# Patient Record
Sex: Female | Born: 1935 | Race: White | Hispanic: No | Smoking: Former smoker
Health system: Southern US, Community
[De-identification: ages and names within clinical notes are randomized; demographics above are authoritative.]

## PROBLEM LIST (undated history)

## (undated) DIAGNOSIS — K219 Gastro-esophageal reflux disease without esophagitis: Secondary | ICD-10-CM

## (undated) DIAGNOSIS — I341 Nonrheumatic mitral (valve) prolapse: Secondary | ICD-10-CM

## (undated) DIAGNOSIS — H409 Unspecified glaucoma: Secondary | ICD-10-CM

## (undated) DIAGNOSIS — H3530 Unspecified macular degeneration: Secondary | ICD-10-CM

## (undated) DIAGNOSIS — I471 Supraventricular tachycardia, unspecified: Secondary | ICD-10-CM

## (undated) DIAGNOSIS — H5440 Blindness, one eye, unspecified eye: Secondary | ICD-10-CM

## (undated) DIAGNOSIS — M1990 Unspecified osteoarthritis, unspecified site: Secondary | ICD-10-CM

## (undated) DIAGNOSIS — R002 Palpitations: Secondary | ICD-10-CM

## (undated) DIAGNOSIS — R5383 Other fatigue: Secondary | ICD-10-CM

## (undated) HISTORY — DX: Other fatigue: R53.83

## (undated) HISTORY — DX: Palpitations: R00.2

## (undated) HISTORY — DX: Nonrheumatic mitral (valve) prolapse: I34.1

## (undated) HISTORY — DX: Blindness, one eye, unspecified eye: H54.40

## (undated) HISTORY — PX: PARTIAL HIP ARTHROPLASTY: SHX733

## (undated) HISTORY — PX: CATARACT EXTRACTION: SUR2

## (undated) HISTORY — DX: Unspecified osteoarthritis, unspecified site: M19.90

## (undated) HISTORY — PX: RETINAL DETACHMENT SURGERY: SHX105

## (undated) HISTORY — PX: OTHER SURGICAL HISTORY: SHX169

## (undated) HISTORY — DX: Supraventricular tachycardia, unspecified: I47.10

## (undated) HISTORY — DX: Supraventricular tachycardia: I47.1

---

## 1988-05-21 HISTORY — PX: TOTAL HIP ARTHROPLASTY: SHX124

## 1999-08-14 ENCOUNTER — Ambulatory Visit (HOSPITAL_COMMUNITY): Admission: RE | Admit: 1999-08-14 | Discharge: 1999-08-14 | Payer: Self-pay | Admitting: Pulmonary Disease

## 1999-08-14 ENCOUNTER — Encounter: Payer: Self-pay | Admitting: Pulmonary Disease

## 2000-02-02 ENCOUNTER — Ambulatory Visit (HOSPITAL_COMMUNITY): Admission: RE | Admit: 2000-02-02 | Discharge: 2000-02-02 | Payer: Self-pay | Admitting: Internal Medicine

## 2001-01-21 ENCOUNTER — Emergency Department (HOSPITAL_COMMUNITY): Admission: EM | Admit: 2001-01-21 | Discharge: 2001-01-22 | Payer: Self-pay | Admitting: Emergency Medicine

## 2001-01-21 ENCOUNTER — Encounter: Payer: Self-pay | Admitting: Emergency Medicine

## 2001-01-29 ENCOUNTER — Ambulatory Visit (HOSPITAL_COMMUNITY): Admission: RE | Admit: 2001-01-29 | Discharge: 2001-01-29 | Payer: Self-pay | Admitting: Family Medicine

## 2001-03-06 ENCOUNTER — Other Ambulatory Visit: Admission: RE | Admit: 2001-03-06 | Discharge: 2001-03-06 | Payer: Self-pay | Admitting: Family Medicine

## 2003-06-14 ENCOUNTER — Other Ambulatory Visit: Admission: RE | Admit: 2003-06-14 | Discharge: 2003-06-14 | Payer: Self-pay | Admitting: Family Medicine

## 2003-08-09 ENCOUNTER — Ambulatory Visit (HOSPITAL_COMMUNITY): Admission: RE | Admit: 2003-08-09 | Discharge: 2003-08-09 | Payer: Self-pay | Admitting: Family Medicine

## 2010-04-18 ENCOUNTER — Ambulatory Visit: Payer: Self-pay | Admitting: Cardiology

## 2010-05-16 ENCOUNTER — Ambulatory Visit: Payer: Self-pay | Admitting: Cardiology

## 2010-09-07 ENCOUNTER — Encounter: Payer: Self-pay | Admitting: Cardiology

## 2010-09-07 DIAGNOSIS — H5440 Blindness, one eye, unspecified eye: Secondary | ICD-10-CM | POA: Insufficient documentation

## 2010-09-07 DIAGNOSIS — I471 Supraventricular tachycardia: Secondary | ICD-10-CM | POA: Insufficient documentation

## 2010-09-07 DIAGNOSIS — R5383 Other fatigue: Secondary | ICD-10-CM | POA: Insufficient documentation

## 2010-09-07 DIAGNOSIS — R002 Palpitations: Secondary | ICD-10-CM | POA: Insufficient documentation

## 2010-09-07 DIAGNOSIS — M1990 Unspecified osteoarthritis, unspecified site: Secondary | ICD-10-CM | POA: Insufficient documentation

## 2010-09-07 DIAGNOSIS — I341 Nonrheumatic mitral (valve) prolapse: Secondary | ICD-10-CM | POA: Insufficient documentation

## 2010-09-11 ENCOUNTER — Ambulatory Visit (INDEPENDENT_AMBULATORY_CARE_PROVIDER_SITE_OTHER): Payer: Medicare Other | Admitting: Cardiology

## 2010-09-11 ENCOUNTER — Encounter: Payer: Self-pay | Admitting: Cardiology

## 2010-09-11 DIAGNOSIS — I341 Nonrheumatic mitral (valve) prolapse: Secondary | ICD-10-CM

## 2010-09-11 DIAGNOSIS — I471 Supraventricular tachycardia: Secondary | ICD-10-CM

## 2010-09-11 DIAGNOSIS — I059 Rheumatic mitral valve disease, unspecified: Secondary | ICD-10-CM

## 2010-09-11 DIAGNOSIS — I498 Other specified cardiac arrhythmias: Secondary | ICD-10-CM

## 2010-09-11 NOTE — Assessment & Plan Note (Signed)
Mild by prior echocardiogram. Exam is unremarkable.

## 2010-09-11 NOTE — Patient Instructions (Signed)
Continue current therapy.  If you have a lot of palpitations you can take an extra Metoprolol.  We will plan on seeing you for an Office visit in 6 months.

## 2010-09-11 NOTE — Assessment & Plan Note (Signed)
Well controlled on beta blocker therapy. I have encouraged her to avoid caffeine. I've instructed her that she may take an extra metoprolol if she is having significant palpitations. She is cleared to go on her travels. I will followup again in 6 months.

## 2010-09-11 NOTE — Progress Notes (Signed)
   Regina Taylor Date of Birth: 1928-03-19   History of Present Illness: Regina Taylor is seen for followup. Since her last visit she has restricted her caffeine intake and has noticed much less palpitations. Rarely she will have a sensation of movement in her chest. She does complain of a nagging cough that is worse after eating. She reports that she was given a prescription for Pepcid by Dr. Katrinka Blazing. She is interested in going on a trip down the [place] and wanted to make sure her heart was okay.  Current Outpatient Prescriptions on File Prior to Visit  Medication Sig Dispense Refill  . aspirin 81 MG tablet Take 81 mg by mouth daily.        . Calcium Carbonate (CALTRATE 600 PO) Take by mouth daily.        . fish oil-omega-3 fatty acids 1000 MG capsule Take by mouth daily.        . metoprolol (TOPROL-XL) 50 MG 24 hr tablet Take 50 mg by mouth daily.        . Multiple Vitamin (MULTIVITAMIN) tablet Take 1 tablet by mouth daily.       . Multiple Vitamins-Minerals (ICAPS PO) Take by mouth daily.        . Red Yeast Rice Extract (RED YEAST RICE PO) Take by mouth daily.        Marland Kitchen latanoprost (XALATAN) 0.005 % ophthalmic solution 1 drop at bedtime.          Allergies  Allergen Reactions  . Iodinated Diagnostic Agents Nausea And Vomiting  . Xalatan     Vision problems    Past Medical History  Diagnosis Date  . Palpitations   . Fatigue   . Arthritis   . SVT (supraventricular tachycardia)   . MVP (mitral valve prolapse)   . Blindness of left eye     Past Surgical History  Procedure Date  . Catarac   . Cataract extraction   . Retinal detachment surgery   . Partial hip arthroplasty     History  Smoking status  . Former Smoker  . Quit date: 09/07/1979  Smokeless tobacco  . Not on file    History  Alcohol Use  . Yes    occaisional wine    Family History  Problem Relation Age of Onset  . Cancer Mother   . Heart disease Father   . Heart attack Brother     Review of  Systems: All other systems were reviewed and are negative.  Physical Exam: BP 118/70  Pulse 64  Wt 161 lb (73.029 kg) She is a pleasant elderly white female in no distress. Her HEENT exam is unremarkable. She has no JVD or bruits. Lungs are clear. Cardiac exam reveals a regular rate and rhythm without gallop or murmur. Abdomen is soft and nontender. She has no edema. Neurologic exam is nonfocal. LABORATORY DATA:   Assessment / Plan:

## 2014-01-25 ENCOUNTER — Emergency Department (HOSPITAL_COMMUNITY): Payer: PRIVATE HEALTH INSURANCE

## 2014-01-25 ENCOUNTER — Inpatient Hospital Stay (HOSPITAL_COMMUNITY)
Admission: EM | Admit: 2014-01-25 | Discharge: 2014-01-27 | DRG: 312 | Disposition: A | Discharge status: Home / Self Care | Payer: PRIVATE HEALTH INSURANCE | Attending: Internal Medicine | Admitting: Internal Medicine

## 2014-01-25 ENCOUNTER — Encounter (HOSPITAL_COMMUNITY): Payer: Self-pay | Admitting: Emergency Medicine

## 2014-01-25 DIAGNOSIS — I498 Other specified cardiac arrhythmias: Secondary | ICD-10-CM | POA: Diagnosis present

## 2014-01-25 DIAGNOSIS — W19XXXS Unspecified fall, sequela: Secondary | ICD-10-CM

## 2014-01-25 DIAGNOSIS — S7010XA Contusion of unspecified thigh, initial encounter: Secondary | ICD-10-CM | POA: Diagnosis present

## 2014-01-25 DIAGNOSIS — W010XXA Fall on same level from slipping, tripping and stumbling without subsequent striking against object, initial encounter: Secondary | ICD-10-CM | POA: Diagnosis present

## 2014-01-25 DIAGNOSIS — Z9849 Cataract extraction status, unspecified eye: Secondary | ICD-10-CM | POA: Diagnosis not present

## 2014-01-25 DIAGNOSIS — S5010XA Contusion of unspecified forearm, initial encounter: Secondary | ICD-10-CM | POA: Diagnosis present

## 2014-01-25 DIAGNOSIS — I471 Supraventricular tachycardia, unspecified: Secondary | ICD-10-CM | POA: Diagnosis present

## 2014-01-25 DIAGNOSIS — H5440 Blindness, one eye, unspecified eye: Secondary | ICD-10-CM | POA: Diagnosis present

## 2014-01-25 DIAGNOSIS — I341 Nonrheumatic mitral (valve) prolapse: Secondary | ICD-10-CM | POA: Diagnosis present

## 2014-01-25 DIAGNOSIS — S60229A Contusion of unspecified hand, initial encounter: Secondary | ICD-10-CM | POA: Diagnosis present

## 2014-01-25 DIAGNOSIS — Z87891 Personal history of nicotine dependence: Secondary | ICD-10-CM | POA: Diagnosis not present

## 2014-01-25 DIAGNOSIS — Z96649 Presence of unspecified artificial hip joint: Secondary | ICD-10-CM

## 2014-01-25 DIAGNOSIS — I9589 Other hypotension: Secondary | ICD-10-CM

## 2014-01-25 DIAGNOSIS — W19XXXA Unspecified fall, initial encounter: Secondary | ICD-10-CM

## 2014-01-25 DIAGNOSIS — M129 Arthropathy, unspecified: Secondary | ICD-10-CM | POA: Diagnosis present

## 2014-01-25 DIAGNOSIS — H538 Other visual disturbances: Secondary | ICD-10-CM | POA: Diagnosis present

## 2014-01-25 DIAGNOSIS — R55 Syncope and collapse: Secondary | ICD-10-CM | POA: Diagnosis not present

## 2014-01-25 DIAGNOSIS — S4991XA Unspecified injury of right shoulder and upper arm, initial encounter: Secondary | ICD-10-CM | POA: Diagnosis present

## 2014-01-25 DIAGNOSIS — Z79899 Other long term (current) drug therapy: Secondary | ICD-10-CM

## 2014-01-25 DIAGNOSIS — Z7982 Long term (current) use of aspirin: Secondary | ICD-10-CM

## 2014-01-25 DIAGNOSIS — H409 Unspecified glaucoma: Secondary | ICD-10-CM | POA: Diagnosis present

## 2014-01-25 DIAGNOSIS — I059 Rheumatic mitral valve disease, unspecified: Secondary | ICD-10-CM | POA: Diagnosis present

## 2014-01-25 DIAGNOSIS — R42 Dizziness and giddiness: Secondary | ICD-10-CM | POA: Diagnosis not present

## 2014-01-25 DIAGNOSIS — R4020 Unspecified coma: Secondary | ICD-10-CM | POA: Diagnosis present

## 2014-01-25 DIAGNOSIS — S4991XS Unspecified injury of right shoulder and upper arm, sequela: Secondary | ICD-10-CM

## 2014-01-25 DIAGNOSIS — M1990 Unspecified osteoarthritis, unspecified site: Secondary | ICD-10-CM | POA: Diagnosis present

## 2014-01-25 DIAGNOSIS — R61 Generalized hyperhidrosis: Secondary | ICD-10-CM

## 2014-01-25 DIAGNOSIS — Y92009 Unspecified place in unspecified non-institutional (private) residence as the place of occurrence of the external cause: Secondary | ICD-10-CM

## 2014-01-25 HISTORY — DX: Gastro-esophageal reflux disease without esophagitis: K21.9

## 2014-01-25 HISTORY — DX: Unspecified glaucoma: H40.9

## 2014-01-25 HISTORY — DX: Unspecified macular degeneration: H35.30

## 2014-01-25 LAB — BASIC METABOLIC PANEL
ANION GAP: 11 (ref 5–15)
BUN: 20 mg/dL (ref 6–23)
CALCIUM: 9.4 mg/dL (ref 8.4–10.5)
CO2: 24 meq/L (ref 19–32)
CREATININE: 0.68 mg/dL (ref 0.50–1.10)
Chloride: 101 mEq/L (ref 96–112)
GFR, EST NON AFRICAN AMERICAN: 78 mL/min — AB (ref >90)
Glucose, Bld: 114 mg/dL — ABNORMAL HIGH (ref 70–99)
Potassium: 4.5 mEq/L (ref 3.7–5.3)
SODIUM: 136 meq/L — AB (ref 137–147)

## 2014-01-25 LAB — CBC WITH DIFFERENTIAL/PLATELET
BASOS ABS: 0 10*3/uL (ref 0.0–0.1)
BASOS PCT: 0 % (ref 0–1)
Eosinophils Absolute: 0.1 10*3/uL (ref 0.0–0.7)
Eosinophils Relative: 1 % (ref 0–5)
HCT: 37.3 % (ref 36.0–46.0)
Hemoglobin: 12.8 g/dL (ref 12.0–15.0)
LYMPHS PCT: 11 % — AB (ref 12–46)
Lymphs Abs: 1 10*3/uL (ref 0.7–4.0)
MCH: 31.1 pg (ref 26.0–34.0)
MCHC: 34.3 g/dL (ref 30.0–36.0)
MCV: 90.5 fL (ref 78.0–100.0)
MONO ABS: 0.4 10*3/uL (ref 0.1–1.0)
Monocytes Relative: 4 % (ref 3–12)
NEUTROS ABS: 7.6 10*3/uL (ref 1.7–7.7)
NEUTROS PCT: 84 % — AB (ref 43–77)
PLATELETS: 171 10*3/uL (ref 150–400)
RBC: 4.12 MIL/uL (ref 3.87–5.11)
RDW: 12.8 % (ref 11.5–15.5)
WBC: 9.1 10*3/uL (ref 4.0–10.5)

## 2014-01-25 LAB — TSH: TSH: 0.756 u[IU]/mL (ref 0.350–4.500)

## 2014-01-25 MED ORDER — POLYVINYL ALCOHOL 1.4 % OP SOLN
1.0000 [drp] | OPHTHALMIC | Status: DC | PRN
  Filled 2014-01-25: qty 15

## 2014-01-25 MED ORDER — OMEGA-3-ACID ETHYL ESTERS 1 G PO CAPS
1.0000 g | ORAL_CAPSULE | Freq: Every day | ORAL | Status: DC
  Administered 2014-01-26 – 2014-01-27 (×2): 1 g via ORAL
  Filled 2014-01-25 (×3): qty 1

## 2014-01-25 MED ORDER — ASPIRIN EC 81 MG PO TBEC
81.0000 mg | DELAYED_RELEASE_TABLET | Freq: Every day | ORAL | Status: DC
  Administered 2014-01-26: 81 mg via ORAL
  Filled 2014-01-25 (×3): qty 1

## 2014-01-25 MED ORDER — TRAMADOL HCL 50 MG PO TABS
25.0000 mg | ORAL_TABLET | Freq: Four times a day (QID) | ORAL | Status: DC | PRN
  Filled 2014-01-25: qty 1

## 2014-01-25 MED ORDER — ENOXAPARIN SODIUM 40 MG/0.4ML ~~LOC~~ SOLN
40.0000 mg | SUBCUTANEOUS | Status: DC
  Filled 2014-01-25 (×3): qty 0.4

## 2014-01-25 MED ORDER — TIMOLOL MALEATE PF 0.5 % OP SOLN: 1.0000 [drp] | Freq: Every day | OPHTHALMIC | Status: DC

## 2014-01-25 MED ORDER — SODIUM CHLORIDE 0.9 % IV BOLUS (SEPSIS)
1000.0000 mL | INTRAVENOUS | Status: AC
  Administered 2014-01-25: 1000 mL via INTRAVENOUS

## 2014-01-25 MED ORDER — ONE-DAILY MULTI VITAMINS PO TABS: 1.0000 | ORAL_TABLET | Freq: Every day | ORAL | Status: DC

## 2014-01-25 MED ORDER — ICAPS PO CAPS: ORAL_CAPSULE | Freq: Every day | ORAL | Status: DC

## 2014-01-25 MED ORDER — SODIUM CHLORIDE 0.9 % IV SOLN
INTRAVENOUS | Status: DC
  Administered 2014-01-26: 08:00:00 via INTRAVENOUS

## 2014-01-25 MED ORDER — OCUVITE-LUTEIN PO CAPS
1.0000 | ORAL_CAPSULE | Freq: Every day | ORAL | Status: DC
  Administered 2014-01-26 – 2014-01-27 (×2): 1 via ORAL
  Filled 2014-01-25 (×2): qty 1

## 2014-01-25 MED ORDER — SODIUM CHLORIDE 0.9 % IV SOLN
INTRAVENOUS | Status: DC
  Administered 2014-01-25: 19:00:00 via INTRAVENOUS

## 2014-01-25 MED ORDER — BION TEARS 0.1-0.3 % OP SOLN: Freq: Every day | OPHTHALMIC | Status: DC

## 2014-01-25 MED ORDER — HYPROMELLOSE 0.3 % OP GEL: 1.0000 [drp] | Freq: Every day | OPHTHALMIC | Status: DC

## 2014-01-25 MED ORDER — ADULT MULTIVITAMIN W/MINERALS CH
1.0000 | ORAL_TABLET | Freq: Every day | ORAL | Status: DC
  Administered 2014-01-26 – 2014-01-27 (×2): 1 via ORAL
  Filled 2014-01-25 (×2): qty 1

## 2014-01-25 MED ORDER — TIMOLOL MALEATE 0.5 % OP SOLG
1.0000 [drp] | Freq: Every day | OPHTHALMIC | Status: DC
  Administered 2014-01-25: 1 [drp] via OPHTHALMIC
  Filled 2014-01-25: qty 5

## 2014-01-25 MED ORDER — CALCIUM CARBONATE 1250 (500 CA) MG PO TABS
1.0000 | ORAL_TABLET | Freq: Every day | ORAL | Status: DC
  Administered 2014-01-26 – 2014-01-27 (×2): 500 mg via ORAL
  Filled 2014-01-25 (×3): qty 1

## 2014-01-25 MED ORDER — SODIUM CHLORIDE 0.9 % IJ SOLN
3.0000 mL | Freq: Two times a day (BID) | INTRAMUSCULAR | Status: DC
  Administered 2014-01-26 – 2014-01-27 (×2): 3 mL via INTRAVENOUS

## 2014-01-25 MED ORDER — OMEGA-3 FATTY ACIDS 1000 MG PO CAPS: 1.0000 g | ORAL_CAPSULE | Freq: Every day | ORAL | Status: DC

## 2014-01-25 MED ORDER — ACETAMINOPHEN 650 MG RE SUPP
650.0000 mg | Freq: Four times a day (QID) | RECTAL | Status: DC | PRN
Start: 2014-01-25 — End: 2014-01-27

## 2014-01-25 MED ORDER — ACETAMINOPHEN 325 MG PO TABS
650.0000 mg | ORAL_TABLET | Freq: Four times a day (QID) | ORAL | Status: DC | PRN
  Administered 2014-01-25 – 2014-01-26 (×2): 650 mg via ORAL
  Filled 2014-01-25 (×2): qty 2

## 2014-01-25 MED ORDER — TRAMADOL HCL 50 MG PO TABS
50.0000 mg | ORAL_TABLET | Freq: Once | ORAL | Status: AC
  Administered 2014-01-25: 50 mg via ORAL
  Filled 2014-01-25: qty 1

## 2014-01-25 MED ORDER — RED YEAST RICE 600 MG PO CAPS: ORAL_CAPSULE | Freq: Every day | ORAL | Status: DC

## 2014-01-25 MED ORDER — CALCIUM CARBONATE 1500 (600 CA) MG PO TABS: ORAL_TABLET | Freq: Every day | ORAL | Status: DC

## 2014-01-25 NOTE — ED Notes (Signed)
Pt to CT at this time.

## 2014-01-25 NOTE — ED Provider Notes (Signed)
80 year old female who had a mechanical fall just prior to arrival. She tripped over an uneven piece of concrete landing on her right side including shoulder, forearm, thigh and hand. On exam the patient has a hematoma and contusion to the right lateral thigh, large hematoma to the right forearm, bruising to the dorsum of the right hand at the MCP. She is very supple joints, soft compartments except for the right forearm which is slightly tense but not painful secondary to the hematoma that is formed. Of note the patient is on aspirin but no other anticoagulants. She will need imaging  Imaging negative for fracture, compression dressing of the forearm, splint for the hand as there is possible fractures at the MCP and ice packs elevation anti-inflammatories and followup with orthopedics.  Prior to d/c, the pt had a recurrent near syncopal episode similar to event from UC.  She had no syncope but was pale, hypotensive, and diaphoretic.  This was severe, improved with fluids but due to recurrence and unsafe for d/c, pt to be admitted for observation.  Results for orders placed during the hospital encounter of 01/25/14  BASIC METABOLIC PANEL      Result Value Ref Range   Sodium 136 (*) 137 - 147 mEq/L   Potassium 4.5  3.7 - 5.3 mEq/L   Chloride 101  96 - 112 mEq/L   CO2 24  19 - 32 mEq/L   Glucose, Bld 114 (*) 70 - 99 mg/dL   BUN 20  6 - 23 mg/dL   Creatinine, Ser 1.61  0.50 - 1.10 mg/dL   Calcium 9.4  8.4 - 09.6 mg/dL   GFR calc non Af Amer 78 (*) >90 mL/min   GFR calc Af Amer >90  >90 mL/min   Anion gap 11  5 - 15  CBC WITH DIFFERENTIAL      Result Value Ref Range   WBC 9.1  4.0 - 10.5 K/uL   RBC 4.12  3.87 - 5.11 MIL/uL   Hemoglobin 12.8  12.0 - 15.0 g/dL   HCT 04.5  40.9 - 81.1 %   MCV 90.5  78.0 - 100.0 fL   MCH 31.1  26.0 - 34.0 pg   MCHC 34.3  30.0 - 36.0 g/dL   RDW 91.4  78.2 - 95.6 %   Platelets 171  150 - 400 K/uL   Neutrophils Relative % 84 (*) 43 - 77 %   Neutro Abs 7.6  1.7 -  7.7 K/uL   Lymphocytes Relative 11 (*) 12 - 46 %   Lymphs Abs 1.0  0.7 - 4.0 K/uL   Monocytes Relative 4  3 - 12 %   Monocytes Absolute 0.4  0.1 - 1.0 K/uL   Eosinophils Relative 1  0 - 5 %   Eosinophils Absolute 0.1  0.0 - 0.7 K/uL   Basophils Relative 0  0 - 1 %   Basophils Absolute 0.0  0.0 - 0.1 K/uL   Dg Pelvis 1-2 Views  01/25/2014   CLINICAL DATA:  Fall, right upper leg pain  EXAM: PELVIS - 1-2 VIEW  COMPARISON:  Right femur radiographs same date, dictated separately.  FINDINGS: Right humeral arthroplasty partly visualized. No displaced pelvic fracture. Sacroiliac joints are within normal limits. Normal bowel gas pattern.  IMPRESSION: No acute pelvic fracture identified.   Electronically Signed   By: Christiana Pellant M.D.   On: 01/25/2014 15:07   Dg Forearm Right  01/25/2014   CLINICAL DATA:  Fall, forearm pain and  swelling  EXAM: RIGHT FOREARM - 2 VIEW  COMPARISON:  Wrist radiographs same date  FINDINGS: There is extensive soft tissue edema about the proximal right forearm and elbow. No fracture of the radius or ulna is identified. No radiopaque foreign body. Age indeterminate ulnar styloid process deformity seen on one view only.  IMPRESSION: Extensive soft tissue swelling about the proximal right forearm.   Electronically Signed   By: Christiana Pellant M.D.   On: 01/25/2014 15:05   Dg Wrist Complete Right  01/25/2014   CLINICAL DATA:  Fall, right wrist pain  EXAM: RIGHT WRIST - COMPLETE 3+ VIEW  COMPARISON:  None.  FINDINGS: The bones are subjectively osteopenic. Deformity of the ulnar styloid process is noted. Linear trabecular disruption is identified at the bases of the fourth and fifth metacarpals, seen on only one view. Carpal rows are normally aligned. First carpometacarpal joint degenerative change is noted.  IMPRESSION: Possible artifactual cortical disruption at the bases of the fourth and fifth metacarpals, correlate clinically for any point tenderness to this area and consider  dedicated hand radiographs if there are symptoms referable to this area.  Age indeterminate deformity of the ulnar styloid process.   Electronically Signed   By: Christiana Pellant M.D.   On: 01/25/2014 15:04   Dg Femur Right  01/25/2014   CLINICAL DATA:  Fall, right hip pain and leg pain  EXAM: RIGHT FEMUR - 2 VIEW  COMPARISON:  None.  FINDINGS: Right total hip arthroplasty noted. No evidence for hardware failure. Bones are subjectively osteopenic. No femoral fracture is identified. Moderate tricompartmental right knee degenerative change noted.  IMPRESSION: No acute abnormality of the right femur.   Electronically Signed   By: Christiana Pellant M.D.   On: 01/25/2014 15:06    Medical screening examination/treatment/procedure(s) were conducted as a shared visit with non-physician practitioner(s) and myself.  I personally evaluated the patient during the encounter.  Clinical Impression:   Final diagnoses:  Fall, initial encounter  Other specified hypotension  Near syncope  Diaphoresis         Vida Roller, MD 01/27/14 2325

## 2014-01-25 NOTE — Discharge Instructions (Signed)
Fall Prevention and Home Safety Falls cause injuries and can affect all age groups. It is possible to use preventive measures to significantly decrease the likelihood of falls. There are many simple measures which can make your home safer and prevent falls. OUTDOORS  Repair cracks and edges of walkways and driveways.  Remove high doorway thresholds.  Trim shrubbery on the main path into your home.  Have good outside lighting.  Clear walkways of tools, rocks, debris, and clutter.  Check that handrails are not broken and are securely fastened. Both sides of steps should have handrails.  Have leaves, snow, and ice cleared regularly.  Use sand or salt on walkways during winter months.  In the garage, clean up grease or oil spills. BATHROOM  Install night lights.  Install grab bars by the toilet and in the tub and shower.  Use non-skid mats or decals in the tub or shower.  Place a plastic non-slip stool in the shower to sit on, if needed.  Keep floors dry and clean up all water on the floor immediately.  Remove soap buildup in the tub or shower on a regular basis.  Secure bath mats with non-slip, double-sided rug tape.  Remove throw rugs and tripping hazards from the floors. BEDROOMS  Install night lights.  Make sure a bedside light is easy to reach.  Do not use oversized bedding.  Keep a telephone by your bedside.  Have a firm chair with side arms to use for getting dressed.  Remove throw rugs and tripping hazards from the floor. KITCHEN  Keep handles on pots and pans turned toward the center of the stove. Use back burners when possible.  Clean up spills quickly and allow time for drying.  Avoid walking on wet floors.  Avoid hot utensils and knives.  Position shelves so they are not too high or low.  Place commonly used objects within easy reach.  If necessary, use a sturdy step stool with a grab bar when reaching.  Keep electrical cables out of the  way.  Do not use floor polish or wax that makes floors slippery. If you must use wax, use non-skid floor wax.  Remove throw rugs and tripping hazards from the floor. STAIRWAYS  Never leave objects on stairs.  Place handrails on both sides of stairways and use them. Fix any loose handrails. Make sure handrails on both sides of the stairways are as long as the stairs.  Check carpeting to make sure it is firmly attached along stairs. Make repairs to worn or loose carpet promptly.  Avoid placing throw rugs at the top or bottom of stairways, or properly secure the rug with carpet tape to prevent slippage. Get rid of throw rugs, if possible.  Have an electrician put in a light switch at the top and bottom of the stairs. OTHER FALL PREVENTION TIPS  Wear low-heel or rubber-soled shoes that are supportive and fit well. Wear closed toe shoes.  When using a stepladder, make sure it is fully opened and both spreaders are firmly locked. Do not climb a closed stepladder.  Add color or contrast paint or tape to grab bars and handrails in your home. Place contrasting color strips on first and last steps.  Learn and use mobility aids as needed. Install an electrical emergency response system.  Turn on lights to avoid dark areas. Replace light bulbs that burn out immediately. Get light switches that glow.  Arrange furniture to create clear pathways. Keep furniture in the same place.  Firmly attach carpet with non-skid or double-sided tape.  Eliminate uneven floor surfaces.  Select a carpet pattern that does not visually hide the edge of steps.  Be aware of all pets. OTHER HOME SAFETY TIPS  Set the water temperature for 120 F (48.8 C).  Keep emergency numbers on or near the telephone.  Keep smoke detectors on every level of the home and near sleeping areas. Document Released: 04/27/2002 Document Revised: 11/06/2011 Document Reviewed: 07/27/2011 [facility] Patient Information 2015  [place], [place]. This information is not intended to replace advice given to you by your health care provider. Make sure you discuss any questions you have with your health care provider.   Is important to follow up with orthopedics in one week for reevaluation of your injuries. You may take naproxen and tramadol as needed for pain. Please return to ED for further evaluation he began to experience fevers, extreme pain, numbness or tingling in the injured extremity.

## 2014-01-25 NOTE — ED Provider Notes (Signed)
DENIESE OBERRY is a 80 y.o. female presents with mechanical fall and near syncopal episode while being evaluated in at [facility].  Pt is very clear about mechanical fall and had no near syncope prior to fall.  Denies hitting her head, LOC or neck pain.      I was called into the room by patient's RN stating patient was diaphoretic, pale, hypotensive and complaining of decreased vision.  Patient found to be alert and oriented, significantly diaphoretic, very pale with blood pressure of 57/36 with absent radial pulses.  Patient with strong carotid pulse.  PCP: Severiano Gilbert, Deboraha Sprang  Physical Exam  BP 67/36  Pulse 64  Temp(Src) 97.7 F (36.5 C) (Oral)  Resp 21  SpO2 100%  Physical Exam  Constitutional: She is oriented to person, place, and time. She appears well-developed and well-nourished. She appears distressed.  HENT:  Head: Atraumatic.  Eyes: Conjunctivae are normal. Pupils are equal, round, and reactive to light.  Neck: Normal range of motion.  Cardiovascular: Normal rate and normal heart sounds.   No murmur heard. Pulmonary/Chest: Effort normal and breath sounds normal. No respiratory distress. She has no wheezes.  Abdominal: Soft. She exhibits no distension. There is no tenderness.  Musculoskeletal:  Right elbow contusion  Lymphadenopathy:    She has no cervical adenopathy.  Neurological: She is alert and oriented to person, place, and time.  Skin: Skin is warm. She is diaphoretic. There is pallor.    ED Course  Procedures  4:47 PM  BP (manual): 74/42 Patient pale, significantly diaphoretic and complaining of vision changes.  Pt re-evaluated by Eber Hong. Will obtain head CT, give fluid bolus and admit to obs.    6:57 PM Patient blood pressure has stabilized. She is no longer pale or diaphoretic. CT head without acute abnormality.  BP 131/59  Pulse 65  Temp(Src) 97.7 F (36.5 C) (Oral)  Resp 13  SpO2 100%   MDM  Patient now with 2 near-syncopal episodes with  significant hypertension today. She reports no history of this in the past. She did not hit her head during the fall and there is no evidence of intracranial bleed on CT scan.  Patient lives alone and I do not believe it is safe for her to go home. Will admit for overnight observation.  Discussed with [facility] who will admit.    Dahlia Client Zenola Dezarn, PA-C 01/26/14 0134

## 2014-01-25 NOTE — ED Notes (Signed)
Ortho at bedside.

## 2014-01-25 NOTE — ED Provider Notes (Signed)
CSN: [id number]     Arrival date & time 01/25/14  1303 History   First MD Initiated Contact with Patient 01/25/14 1308     Chief Complaint  Patient presents with  . Fall     (Consider location/radiation/quality/duration/timing/severity/associated sxs/prior Treatment) HPI Regina Taylor is a 80 y.o. female the past medical history of SVT, right hip arthroplasty, and glaucoma who presents for evaluation after a fall. Patient states at about 11:00 this morning she was out walking her dog and tripped over a spot in the sidewalk. She denies having lost consciousness after the fall, no dizziness before the fall. She reports falling on her right side hitting her right forearm and right leg on the sidewalk. She denies hitting her head. She denies nausea vomiting. She got up immediately after the fall and walked back home. Her daughter was with her during the event and took her to urgent care. While at urgent care patient was seated and reports having a presyncopal event. She denies having lost consciousness at urgent care. She describes it as having a "spell". She describes it as just feeling a little dizzy and lightheaded, but the symptoms have since resolved. She denies fevers, difficulty breathing, chest pain, abdominal pain or dysuria, numbness or weakness.  Past Medical History  Diagnosis Date  . Palpitations   . Fatigue   . Arthritis   . SVT (supraventricular tachycardia)   . MVP (mitral valve prolapse)   . Blindness of left eye    Past Surgical History  Procedure Laterality Date  . Catarac    . Cataract extraction    . Retinal detachment surgery    . Partial hip arthroplasty     Family History  Problem Relation Age of Onset  . Cancer Mother   . Heart disease Father   . Heart attack Brother    History  Substance Use Topics  . Smoking status: Former Smoker    Quit date: 09/07/1979  . Smokeless tobacco: Not on file  . Alcohol Use: Yes     Comment: occaisional wine   OB History    Grav Para Term Preterm Abortions TAB SAB Ect Mult Living                 Review of Systems  Constitutional: Negative for fever.  HENT: Negative for sore throat.   Eyes: Negative for visual disturbance.  Respiratory: Negative for shortness of breath.   Cardiovascular: Negative for chest pain.  Gastrointestinal: Negative for abdominal pain.  Endocrine: Negative for polyuria.  Genitourinary: Negative for dysuria.  Musculoskeletal: Positive for arthralgias and myalgias.  Skin: Negative for rash.  Neurological: Negative for weakness, numbness and headaches.      Allergies  Iodinated diagnostic agents and Xalatan  Home Medications   Prior to Admission medications   Medication Sig Start Date End Date Taking? Authorizing Provider  aspirin 81 MG tablet Take 81 mg by mouth daily.      Historical Provider, MD  Calcium Carbonate (CALTRATE 600 PO) Take by mouth daily.      Historical Provider, MD  fish oil-omega-3 fatty acids 1000 MG capsule Take by mouth daily.      Historical Provider, MD  metoprolol (TOPROL-XL) 50 MG 24 hr tablet Take 50 mg by mouth daily.      Historical Provider, MD  Multiple Vitamin (MULTIVITAMIN) tablet Take 1 tablet by mouth daily.     Historical Provider, MD  Multiple Vitamins-Minerals (ICAPS PO) Take by mouth daily.  Historical Provider, MD  Red Yeast Rice Extract (RED YEAST RICE PO) Take by mouth daily.      Historical Provider, MD  TIMOPTIC OCUDOSE 0.5 % SOLN  11/28/13   Historical Provider, MD   BP 117/80  Pulse 65  Temp(Src) 97.7 F (36.5 C) (Oral)  Resp 16  SpO2 99% Physical Exam  Nursing note and vitals reviewed. Constitutional: She is oriented to person, place, and time. She appears well-developed and well-nourished.  HENT:  Head: Normocephalic and atraumatic.  Mouth/Throat: Oropharynx is clear and moist.  Eyes: Conjunctivae are normal. Pupils are equal, round, and reactive to light. Right eye exhibits no discharge. No scleral icterus.   Blind in left eye-existing condition  Neck: Neck supple.  Cardiovascular: Normal rate, regular rhythm, normal heart sounds and intact distal pulses.   Pulmonary/Chest: Effort normal and breath sounds normal. No respiratory distress. She has no wheezes. She has no rales.  Abdominal: Soft. There is no tenderness.  Musculoskeletal: She exhibits no tenderness.  Ecchymosis and significant edema right proximal forearm and right femur. Mild superficial abrasion over right superior shoulder. Right elbow range of motion slightly limited due to discomfort. No snuffbox tenderness. Full wrist range of motion no discomfort. Grip strength is mildly decreased due to discomfort. Ecchymosis over her fourth and fifth right metacarpal.  Neurological: She is alert and oriented to person, place, and time.  Cranial Nerves II-XII grossly intact. No focal neurodeficits appreciated. Neurovascularly intact  Skin: Skin is warm and dry. No rash noted.  Psychiatric: She has a normal mood and affect.    ED Course  Procedures (including critical care time) Labs Review Labs Reviewed  BASIC METABOLIC PANEL - Abnormal; Notable for the following:    Sodium 136 (*)    Glucose, Bld 114 (*)    GFR calc non Af Amer 78 (*)    All other components within normal limits  CBC WITH DIFFERENTIAL - Abnormal; Notable for the following:    Neutrophils Relative % 84 (*)    Lymphocytes Relative 11 (*)    All other components within normal limits    Imaging Review Dg Pelvis 1-2 Views  01/25/2014   CLINICAL DATA:  Fall, right upper leg pain  EXAM: PELVIS - 1-2 VIEW  COMPARISON:  Right femur radiographs same date, dictated separately.  FINDINGS: Right humeral arthroplasty partly visualized. No displaced pelvic fracture. Sacroiliac joints are within normal limits. Normal bowel gas pattern.  IMPRESSION: No acute pelvic fracture identified.   Electronically Signed   By: Christiana Pellant M.D.   On: 01/25/2014 15:07   Dg Forearm  Right  01/25/2014   CLINICAL DATA:  Fall, forearm pain and swelling  EXAM: RIGHT FOREARM - 2 VIEW  COMPARISON:  Wrist radiographs same date  FINDINGS: There is extensive soft tissue edema about the proximal right forearm and elbow. No fracture of the radius or ulna is identified. No radiopaque foreign body. Age indeterminate ulnar styloid process deformity seen on one view only.  IMPRESSION: Extensive soft tissue swelling about the proximal right forearm.   Electronically Signed   By: Christiana Pellant M.D.   On: 01/25/2014 15:05   Dg Wrist Complete Right  01/25/2014   CLINICAL DATA:  Fall, right wrist pain  EXAM: RIGHT WRIST - COMPLETE 3+ VIEW  COMPARISON:  None.  FINDINGS: The bones are subjectively osteopenic. Deformity of the ulnar styloid process is noted. Linear trabecular disruption is identified at the bases of the fourth and fifth metacarpals, seen on only one  view. Carpal rows are normally aligned. First carpometacarpal joint degenerative change is noted.  IMPRESSION: Possible artifactual cortical disruption at the bases of the fourth and fifth metacarpals, correlate clinically for any point tenderness to this area and consider dedicated hand radiographs if there are symptoms referable to this area.  Age indeterminate deformity of the ulnar styloid process.   Electronically Signed   By: Christiana Pellant M.D.   On: 01/25/2014 15:04   Dg Femur Right  01/25/2014   CLINICAL DATA:  Fall, right hip pain and leg pain  EXAM: RIGHT FEMUR - 2 VIEW  COMPARISON:  None.  FINDINGS: Right total hip arthroplasty noted. No evidence for hardware failure. Bones are subjectively osteopenic. No femoral fracture is identified. Moderate tricompartmental right knee degenerative change noted.  IMPRESSION: No acute abnormality of the right femur.   Electronically Signed   By: Christiana Pellant M.D.   On: 01/25/2014 15:06     EKG Interpretation   Date/Time:  Monday January 25 2014 14:20:37 EDT Ventricular Rate:  56 PR  Interval:  149 QRS Duration: 92 QT Interval:  473 QTC Calculation: 456 R Axis:   45 Text Interpretation:  Sinus bradycardia ECG OTHERWISE WITHIN NORMAL LIMITS  since last tracing no significant change Confirmed by MILLER  MD, BRIAN  [phone]) on 01/25/2014 2:25:00 PM     Meds given in ED:  Medications  traMADol (ULTRAM) tablet 50 mg (50 mg Oral Given 01/25/14 1537)    New Prescriptions   No medications on file   Filed Vitals:   01/25/14 1317 01/25/14 1320 01/25/14 1530 01/25/14 1545  BP: 122/55  132/56 117/80  Pulse:    65  Temp:  97.7 F (36.5 C)    TempSrc:  Oral    Resp: SpO2: 100%  100% 99%    MDM  Vitals stable - WNL -afebrile Pt resting comfortably in ED. In no apparent distress. Patient able to get up and ambulate independently in ED room with mild discomfort and right leg. Based on clinical picture I do not believe the fall was precipitated by a syncopal event. I believe the fall was likely due to the mechanical issue of her tripping on the concrete. Labwork shows no evidence of an anemia, infection, dehydration or any other pathological reason for syncope at urgent care. Her presyncopal than urgent care was likely due to recent injury or vasovagal response. EKG not concerning at this time Imaging shows possible cortical disruption the basis of fourth and fifth metacarpals, we'll splint prophylactically. No other evidence of fracture or dislocation in forearm, hip, or femur Gross soft tissue swelling, right forearm/hematoma--discussed the importance of followup to evaluate swelling in forearm and to return to ED if swelling increases, and extensive pain, or numbness or tingling in her right arm. Will DC with Aleve and tramadol for pain. To followup with orthopedics within one week. Discussed f/u with PCP and return precautions, pt very amenable to plan.   Final diagnoses:  Fall, initial encounter  Prior to patient discharge, I discussed and reviewed this case  with Dr.Brian Camila Li, PA-C 01/25/14 [phone]

## 2014-01-25 NOTE — Progress Notes (Signed)
Orthopedic Tech Progress Note Patient Details:  Regina Taylor Nov 14, 1927 [id number] Volar splint applied to RUE Ortho Devices Type of Ortho Device: Ace wrap;Volar splint Ortho Device/Splint Location: RUE Ortho Device/Splint Interventions: Application   Asia R Thompson 01/25/2014, 5:26 PM

## 2014-01-25 NOTE — ED Notes (Signed)
Ortho tech called out to state pt felt "woozy". Upon assessment pt is diaphoretic, pale and hypotensive. Regina Taylor, [name] aware and at bedside.

## 2014-01-25 NOTE — ED Notes (Signed)
Pt back from CT

## 2014-01-25 NOTE — H&P (Signed)
[facility] History and Physical  Regina Taylor ZOX:[id number] DOB: June 02, 1927 DOA: 01/25/2014  Referring physician: ED physician PCP: Severiano Gilbert, Deboraha Sprang Specialists:   Chief Complaint: syncope and fall  HPI: Regina Taylor is a 80 y.o. female   Regina Taylor is a 80 y.o. 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  Patient reports that she is living at home with her dog and has been doing well until today. This morning when she was walking her dog, she accidentally tripped her steps and had a fall. He denies any abnormal feeling before this event. He denies palpitation, chest pain, shortness of breath, or weakness, numbness and tingling sensations in her extremities. No LOC. No urinary incontinence or lose control bowel movement. She injured her right arm and the wight upper leg with a moderate pain. Her right elbow was swelling and painful. She did not injure her head. She was brought to the [facility] emergency room after the event. She had episode of feeling hot, sweating in that emergency room. She was found to have low oxygen per patient (could not remember the numbers). Then she was transferred to [facility] emergency room for further evaluation.  The patient was found to have blood pressure of 57/36 mmHg in ED. She had CT-head which was negative for acute abnormalities. She had X-Ray for right arm and leg, which showed extensive soft tissue swelling about the proximal right forearm, but no bony fracture. EKG showed sinus rhythm with bradycardia with heart rates of 56/min on EKG.  The patient was initially thought to be discharged home by ED, but she developed another episode of abnormal feeling, including feeling hot and sweating, but no chest pain, shortness of breath or palpitation. Then decision was made by ED to admit the patient for further evaluation.   Review of Systems: As presented in the  history of presenting illness, rest negative.  Where does patient live?  Lives alone with her dog at home in [address] Can patient participate in ADLs? Yes  Allergy:  Allergies  Allergen Reactions  . Iodinated Diagnostic Agents Nausea And Vomiting  . Xalatan     Vision problems    Past Medical History  Diagnosis Date  . Palpitations   . Fatigue   . Arthritis   . SVT (supraventricular tachycardia)   . MVP (mitral valve prolapse)   . Blindness of left eye   . Glaucoma     Past Surgical History  Procedure Laterality Date  . Catarac    . Cataract extraction    . Retinal detachment surgery    . Partial hip arthroplasty      Social History:  reports that she quit smoking about 34 years ago. She does not have any smokeless tobacco history on file. She reports that she drinks alcohol. She reports that she does not use illicit drugs.  Family History:  Family History  Problem Relation Age of Onset  . Cancer Mother   . Heart disease Father   . Heart attack Brother      Prior to Admission medications   Medication Sig Start Date End Date Taking? Authorizing Provider  Artificial Tear Solution (BION TEARS OP) Apply 1 drop to eye daily as needed (dry eyes).   Yes Historical Provider, MD  aspirin 81 MG tablet Take 81 mg by mouth daily.     Yes Historical Provider, MD  Calcium Carbonate (CALTRATE  600 PO) Take 1 tablet by mouth daily.    Yes Historical Provider, MD  fish oil-omega-3 fatty acids 1000 MG capsule Take 1 g by mouth daily.    Yes Historical Provider, MD  Hypromellose (SYSTANE OVERNIGHT THERAPY) 0.3 % GEL Apply 1 drop to eye at bedtime.   Yes Historical Provider, MD  metoprolol (TOPROL-XL) 50 MG 24 hr tablet Take 50 mg by mouth daily.     Yes Historical Provider, MD  Multiple Vitamin (MULTIVITAMIN) tablet Take 1 tablet by mouth daily.    Yes Historical Provider, MD  Multiple Vitamins-Minerals (ICAPS PO) Take 1 capsule by mouth daily.    Yes Historical Provider, MD   Red Yeast Rice Extract (RED YEAST RICE PO) Take 1 tablet by mouth daily.    Yes Historical Provider, MD  TIMOPTIC OCUDOSE 0.5 % SOLN Place 1 drop into the right eye daily.  11/28/13  Yes Historical Provider, MD    Physical Exam: Filed Vitals:   01/25/14 1857 01/25/14 1859 01/25/14 1915 01/25/14 2018  BP: 125/68 113/76 135/73 128/77  Pulse: 78  70 78  Temp:    98.1 F (36.7 C)  TempSrc:    Oral  Resp: Height:     (1.651 m)  Weight:    79.334 kg (174 lb 14.4 oz)  SpO2: 100% 96% 100% 99%   General: Not in acute distress.  HEENT:       Eyes: left eye blinded. Right eye with PERRL, EOMI, no scleral icterus       ENT: No discharge from the ears and nose, no pharynx injection, no tonsillar enlargement.        Neck: No JVD, no bruit, no mass felt. Cardiac: S1/S2, RRR, No murmurs, gallops or rubs Pulm: Good air movement bilaterally. Clear to auscultation bilaterally. No rales, wheezing, rhonchi or rubs. Abd: Soft, nondistended, nontender, no rebound pain, no organomegaly, BS present Ext: Right elbow has contusion, swelling and tender.  Radial pulse is present. There is a bruise over the lateral right thigh, mildly tender to palpation.  2+DP/PT pulse bilaterally Musculoskeletal: No joint deformities, erythema, or stiffness, ROM full Skin: No rashes.  Neuro: Alert and oriented X3, cranial nerves II-XII grossly intact, muscle strength 5/5 in all extremities (left eye blinded), sensation to light touch intact. Brachial reflex 2+ bilaterally. Knee reflex 1+ bilaterally. Negative Babinski's sign.  Psych: Patient is not psychotic, no suicidal or hemocidal ideation.  Labs on Admission:  Basic Metabolic Panel:  Recent Labs Lab 01/25/14 1406  NA 136*  K 4.5  CL 101  CO2 24  GLUCOSE 114*  BUN 20  CREATININE 0.68  CALCIUM 9.4   Liver Function Tests: No results found for this basename: AST, ALT, ALKPHOS, BILITOT, PROT, ALBUMIN,  in the last 168 hours No results found for  this basename: LIPASE, AMYLASE,  in the last 168 hours No results found for this basename: AMMONIA,  in the last 168 hours CBC:  Recent Labs Lab 01/25/14 1406  WBC 9.1  NEUTROABS 7.6  HGB 12.8  HCT 37.3  MCV 90.5  PLT 171   Cardiac Enzymes: No results found for this basename: CKTOTAL, CKMB, CKMBINDEX, TROPONINI,  in the last 168 hours  BNP (last 3 results) No results found for this basename: PROBNP,  in the last 8760 hours CBG: No results found for this basename: GLUCAP,  in the last 168 hours  Radiological Exams on Admission: Dg Pelvis 1-2 Views  01/25/2014   CLINICAL  DATA:  Fall, right upper leg pain  EXAM: PELVIS - 1-2 VIEW  COMPARISON:  Right femur radiographs same date, dictated separately.  FINDINGS: Right humeral arthroplasty partly visualized. No displaced pelvic fracture. Sacroiliac joints are within normal limits. Normal bowel gas pattern.  IMPRESSION: No acute pelvic fracture identified.   Electronically Signed   By: Christiana Pellant M.D.   On: 01/25/2014 15:07   Dg Forearm Right  01/25/2014   CLINICAL DATA:  Fall, forearm pain and swelling  EXAM: RIGHT FOREARM - 2 VIEW  COMPARISON:  Wrist radiographs same date  FINDINGS: There is extensive soft tissue edema about the proximal right forearm and elbow. No fracture of the radius or ulna is identified. No radiopaque foreign body. Age indeterminate ulnar styloid process deformity seen on one view only.  IMPRESSION: Extensive soft tissue swelling about the proximal right forearm.   Electronically Signed   By: Christiana Pellant M.D.   On: 01/25/2014 15:05   Dg Wrist Complete Right  01/25/2014   CLINICAL DATA:  Fall, right wrist pain  EXAM: RIGHT WRIST - COMPLETE 3+ VIEW  COMPARISON:  None.  FINDINGS: The bones are subjectively osteopenic. Deformity of the ulnar styloid process is noted. Linear trabecular disruption is identified at the bases of the fourth and fifth metacarpals, seen on only one view. Carpal rows are normally aligned.  First carpometacarpal joint degenerative change is noted.  IMPRESSION: Possible artifactual cortical disruption at the bases of the fourth and fifth metacarpals, correlate clinically for any point tenderness to this area and consider dedicated hand radiographs if there are symptoms referable to this area.  Age indeterminate deformity of the ulnar styloid process.   Electronically Signed   By: Christiana Pellant M.D.   On: 01/25/2014 15:04   Dg Femur Right  01/25/2014   CLINICAL DATA:  Fall, right hip pain and leg pain  EXAM: RIGHT FEMUR - 2 VIEW  COMPARISON:  None.  FINDINGS: Right total hip arthroplasty noted. No evidence for hardware failure. Bones are subjectively osteopenic. No femoral fracture is identified. Moderate tricompartmental right knee degenerative change noted.  IMPRESSION: No acute abnormality of the right femur.   Electronically Signed   By: Christiana Pellant M.D.   On: 01/25/2014 15:06   Ct Head Wo Contrast  01/25/2014   CLINICAL DATA:  Fall, near syncope  EXAM: CT HEAD WITHOUT CONTRAST  TECHNIQUE: Contiguous axial images were obtained from the base of the skull through the vertex without intravenous contrast.  COMPARISON:  08/08/2013  FINDINGS: No evidence of parenchymal hemorrhage or extra-axial fluid collection. No mass lesion, mass effect, or midline shift.  No CT evidence of acute infarction.  Mild Subcortical white matter and periventricular small vessel ischemic changes. Intracranial atherosclerosis.  Cerebral volume is within normal limits.  No ventriculomegaly.  The visualized paranasal sinuses are essentially clear. The mastoid air cells are unopacified.  No evidence of calvarial fracture.  IMPRESSION: No evidence of acute intracranial abnormality.  Mild small vessel ischemic changes with intracranial atherosclerosis.   Electronically Signed   By: Charline Bills M.D.   On: 01/25/2014 18:49    EKG: Independently reviewed. Sinus rhythm, regular, bradycardia,  normal axis, normal R wave  progression, normal QT interval, No ischemic change in T waves or ST segments.  Assessment/Plan Principal Problem:   Syncope Active Problems:   Arthritis   SVT (supraventricular tachycardia)   MVP (mitral valve prolapse)   Blindness of left eye   Glaucoma   Injury of right upper  arm   1. Syncope:  The  etiology is not completely clear at this moment, but is is likely due to cardiac origin. Patient has significant history of MVP and SVT. She had bradycardia on EKG with a heart rate of 54. She is currently taking metoprolol and also using beta blocker eyedrops for glaucoma. It is possible that patient had bradycardia which may have caused her syncope episode. In addition she was found to have low blood pressure in ED which is consistent with this possibility. Other differential diagnoses include, but less likely, hypoglycemia (given that patient had a feeling hot and sweating, but her blood sugar level was normal on initial BMP, pulmonary embolism (no any chest pain, no shortness of breath when I evaluated the patient in ED, oxygen saturation is normal), ACS (no any chest pain, no ischemic change on EKG), stroke (no weakness or numbness in her extremities, CT head is negative for acute abnormalities). Orthostatic status cannot be completely ruled out.    - Admit to Tele bed: -  2-D echocardiogram given hx of MVP and no 2-D echo in the record - Lipid profile, TSH  - PT evaluation - hold home metoprolol  - orthostatic sign - repeat EKG in AM - fall precaution - IVF: NS 75 cc/h  2. Glaucoma: No eye pain, no acute vision change - will continue home meds.   3. Injury of right arm: X-ray was done with no bony fracture. Patient had mild pain.  - Tylenol and tramadol when necessary for pain.   4. Hx of MVP and SVT: see #1   DVT ppx: SQ Lovenox  Code Status: Full code Family Communication: None at bed side.    Disposition Plan: Admit to inpatient, tele bed  Lorretta Harp [facility] Pager [phone]  If 7PM-7AM, please contact night-coverage [email or web address] Password [email or web address] 01/25/2014, 8:44 PM

## 2014-01-25 NOTE — Progress Notes (Signed)
Orthopedic Tech Progress Note Patient Details:  Regina Taylor Aug 12, 1927 [id number] Began work to splint patient's wrist when patient began complaining of nausea, feeling clammy, and blurred vision. Nurse called in to assess patient. BP dropped. MD to assess and call Ortho when patient is able to continue with splint application. Patient ID: Regina Taylor, female   DOB: [birth year], 80 y.o.   MRN: [id number]   Orie Rout 01/25/2014, 4:38 PM

## 2014-01-25 NOTE — ED Notes (Signed)
Ortho tech paged to apply splint.

## 2014-01-25 NOTE — ED Notes (Signed)
Per EMS- pt tripped and fell while walking dog. Pt denies LOC, head, neck pain. A friend took the pt to the drs office. While there pt had a near syncopal episode. Pt noted to have swelling to rt forearm and abrasion to rt shoulder. Pt moves extremity well and states that it is "just stiff". Pt was ambulatory.

## 2014-01-25 NOTE — ED Notes (Signed)
Transporting patient to new room assignment. 

## 2014-01-26 ENCOUNTER — Encounter (HOSPITAL_COMMUNITY): Payer: Self-pay | Admitting: General Practice

## 2014-01-26 DIAGNOSIS — H5440 Blindness, one eye, unspecified eye: Secondary | ICD-10-CM

## 2014-01-26 DIAGNOSIS — I9589 Other hypotension: Secondary | ICD-10-CM

## 2014-01-26 DIAGNOSIS — I498 Other specified cardiac arrhythmias: Secondary | ICD-10-CM

## 2014-01-26 DIAGNOSIS — I517 Cardiomegaly: Secondary | ICD-10-CM

## 2014-01-26 DIAGNOSIS — R404 Transient alteration of awareness: Secondary | ICD-10-CM

## 2014-01-26 DIAGNOSIS — R55 Syncope and collapse: Secondary | ICD-10-CM

## 2014-01-26 DIAGNOSIS — R4020 Unspecified coma: Secondary | ICD-10-CM | POA: Diagnosis present

## 2014-01-26 LAB — BASIC METABOLIC PANEL
Anion gap: 11 (ref 5–15)
BUN: 21 mg/dL (ref 6–23)
CHLORIDE: 102 meq/L (ref 96–112)
CO2: 24 mEq/L (ref 19–32)
CREATININE: 0.63 mg/dL (ref 0.50–1.10)
Calcium: 8.2 mg/dL — ABNORMAL LOW (ref 8.4–10.5)
GFR calc Af Amer: >90 mL/min (ref >90)
GFR calc non Af Amer: 80 mL/min — ABNORMAL LOW (ref >90)
Glucose, Bld: 167 mg/dL — ABNORMAL HIGH (ref 70–99)
Potassium: 4.3 mEq/L (ref 3.7–5.3)
Sodium: 137 mEq/L (ref 137–147)

## 2014-01-26 LAB — LIPID PANEL
Cholesterol: 147 mg/dL (ref 0–200)
HDL: 47 mg/dL (ref >39)
LDL CALC: 87 mg/dL (ref 0–99)
TRIGLYCERIDES: 63 mg/dL (ref <150)
Total CHOL/HDL Ratio: 3.1 RATIO
VLDL: 13 mg/dL (ref 0–40)

## 2014-01-26 LAB — URINALYSIS, ROUTINE W REFLEX MICROSCOPIC
Bilirubin Urine: NEGATIVE
GLUCOSE, UA: NEGATIVE mg/dL
HGB URINE DIPSTICK: NEGATIVE
Ketones, ur: NEGATIVE mg/dL
Nitrite: NEGATIVE
PROTEIN: NEGATIVE mg/dL
SPECIFIC GRAVITY, URINE: 1.02 (ref 1.005–1.030)
Urobilinogen, UA: 0.2 mg/dL (ref 0.0–1.0)
pH: 6 (ref 5.0–8.0)

## 2014-01-26 LAB — TROPONIN I: Troponin I: <0.3 ng/mL (ref <0.30)

## 2014-01-26 LAB — URINE MICROSCOPIC-ADD ON

## 2014-01-26 LAB — VITAMIN B12: Vitamin B-12: 829 pg/mL (ref 211–911)

## 2014-01-26 MED ORDER — ONDANSETRON HCL 4 MG/2ML IJ SOLN: 8.0000 mg | Freq: Four times a day (QID) | INTRAMUSCULAR | Status: DC | PRN

## 2014-01-26 MED ORDER — ONDANSETRON 8 MG/NS 50 ML IVPB
8.0000 mg | Freq: Four times a day (QID) | INTRAVENOUS | Status: DC | PRN
  Filled 2014-01-26: qty 8

## 2014-01-26 MED ORDER — PANTOPRAZOLE SODIUM 40 MG PO TBEC
40.0000 mg | DELAYED_RELEASE_TABLET | Freq: Two times a day (BID) | ORAL | Status: DC
  Administered 2014-01-26 – 2014-01-27 (×3): 40 mg via ORAL
  Filled 2014-01-26 (×3): qty 1

## 2014-01-26 NOTE — Progress Notes (Signed)
UR Completed Regina Guerrero Graves-Bigelow, RN,BSN [phone]  

## 2014-01-26 NOTE — Care Management Note (Unsigned)
    Page 1 of 1   01/26/2014     11:09:55 AM CARE MANAGEMENT NOTE 01/26/2014  Patient:  Regina Taylor, Regina Taylor   Account Number:  [id number]  Date Initiated:  01/26/2014  Documentation initiated by:  GRAVES-BIGELOW,Porsha Skilton  Subjective/Objective Assessment:   Pt admitted for syncopal episode.     Action/Plan:   CM will monitor for disposition needs.   Anticipated DC Date:  01/27/2014   Anticipated DC Plan:  HOME/SELF CARE      DC Planning Services  CM consult      Choice offered to / List presented to:             Status of service:  In process, will continue to follow Medicare Important Message given?   (If response is "NO", the following Medicare IM given date fields will be blank) Date Medicare IM given:   Medicare IM given by:   Date Additional Medicare IM given:   Additional Medicare IM given by:    Discharge Disposition:    Per UR Regulation:  Reviewed for med. necessity/level of care/duration of stay  If discussed at Long Length of Stay Meetings, dates discussed:    Comments:

## 2014-01-26 NOTE — Evaluation (Signed)
Physical Therapy Evaluation Patient Details Name: Regina Taylor MRN: [id number] DOB: Mar 30, 1928 Today's Date: 01/26/2014   History of Present Illness  Regina Taylor is a 80 y.o. 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 States she didn't pick up her feet walking and tripped on crack in road when walking her dog but didn't pass out. Pt reports no difficulty at home but currently with RUE pain and limited ROM recommend son purchase easy open microwave meals for short term so that cooking would be manageable. Pt educated for stairs and overall moving well but limited ability with ADLs due to RUE. Will follow acutely to maximize ROM, activity and independence for safe return home.     Follow Up Recommendations No PT follow up    Equipment Recommendations  None recommended by PT    Recommendations for Other Services       Precautions / Restrictions Precautions Precautions: Fall Precaution Comments: left eye blind      Mobility  Bed Mobility Overal bed mobility: Modified Independent                Transfers Overall transfer level: Modified independent                  Ambulation/Gait Ambulation/Gait assistance: Modified independent (Device/Increase time) Ambulation Distance (Feet): 350 Feet Assistive device: None Gait Pattern/deviations: WFL(Within Functional Limits)   Gait velocity interpretation: Below normal speed for age/gender    Stairs Stairs: Yes Stairs assistance: Supervision Stair Management: One rail Right;Step to pattern;Forwards Number of Stairs: 11 General stair comments: pt with cues for sequence as alternating pattern was too painful  Wheelchair Mobility    Modified Rankin (Stroke Patients Only)       Balance Overall balance assessment: No apparent balance deficits (not formally  assessed)                                           Pertinent Vitals/Pain Pain Assessment: No/denies pain    Home Living Family/patient expects to be discharged to:: Private residence Living Arrangements: Alone Available Help at Discharge: Family;Available 24 hours/day (for 1 week) Type of Home: House Home Access: Stairs to enter Entrance Stairs-Rails: Right Entrance Stairs-Number of Steps: 5 Home Layout: Two level Home Equipment: Walker - 2 wheels;Cane - single point      Prior Function Level of Independence: Independent               Hand Dominance        Extremity/Trunk Assessment   Upper Extremity Assessment: RUE deficits/detail RUE Deficits / Details: limited by splint and wrapping for injured arm. limited dexterity         Lower Extremity Assessment: RLE deficits/detail RLE Deficits / Details: decreased ROM secondary to edema and pain    Cervical / Trunk Assessment: Normal  Communication   Communication: No difficulties  Cognition Arousal/Alertness: Awake/alert Behavior During Therapy: WFL for tasks assessed/performed Overall Cognitive Status: Within Functional Limits for tasks assessed                      General Comments      Exercises        Assessment/Plan    PT Assessment Patient needs continued PT services  PT Diagnosis Difficulty walking   PT Problem List Decreased strength;Decreased range of motion;Decreased mobility;Decreased activity tolerance  PT Treatment Interventions Gait training;Functional mobility training;Therapeutic exercise;Patient/family education   PT Goals (Current goals can be found in the Care Plan section) Acute Rehab PT Goals Patient Stated Goal: return home PT Goal Formulation: With patient Time For Goal Achievement: 02/02/14 Potential to Achieve Goals: Good    Frequency Min 3X/week   Barriers to discharge Decreased caregiver support      Co-evaluation                End of Session Equipment Utilized During Treatment: Gait belt Activity Tolerance: Patient tolerated treatment well Patient left: in chair;with call bell/phone within reach;with chair alarm set Nurse Communication: Mobility status         Time: 1610-9604 PT Time Calculation (min): 18 min   Charges:   PT Evaluation $Initial PT Evaluation Tier I: 1 Procedure PT Treatments $Gait Training: 8-22 mins   PT G CodesDelorse Lek 01/26/2014, 10:16 AM Delaney Meigs, PT [phone]

## 2014-01-26 NOTE — Progress Notes (Addendum)
[facility] PROGRESS NOTE   Interval history: 80 y.o. 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 Patient reports that she is living at home with her dog and has been doing well until today. This morning when she was walking her dog, she accidentally tripped her steps and had a fall. He denies any abnormal feeling before this event. He denies palpitation, chest pain, shortness of breath, or weakness, numbness and tingling sensations in her extremities. No LOC. No urinary incontinence or lose control bowel movement. Not the etiology of her LOC. Work up is negative. U/a, b12, cortisol and orthostatics pending.  Assessment/Plan: LOC (loss of consciousness) - No lost of control of Bowel or urine. - Cardiac marker negative, EKG NSR - Ct head: Mild small vessel ischemic changes with intracranial atherosclerosis. - No events on telemetry, check U/a, B12 and cortisol in am. - TSH <1.0 - check orthostatic.  Injury of right upper arm: - x-ray Extensive soft tissue swelling about the proximal right forearm.  SVT (supraventricular tachycardia) - SR, no events on telemetry.     Code Status: full Family Communication: none  Disposition Plan: obervation   Consultants:  none  Procedures:  Multiple x-ray  Antibiotics:  None  HPI/Subjective: No compalins  Objective: Filed Vitals:   01/25/14 1915 01/25/14 2018 01/26/14 0000 01/26/14 0400  BP: 135/73 128/77 114/42 111/80  Pulse: 70 78 82 86  Temp:  98.1 F (36.7 C)  97.9 F (36.6 C)  TempSrc:  Oral  Oral  Resp: Height:   (1.651 m)    Weight:  79.334 kg (174 lb 14.4 oz)  78.926 kg (174 lb)  SpO2: 100% 99% 98% 100%   No intake or output data in the 24 hours ending 01/26/14 0812 Filed Weights   01/25/14 2018 01/26/14 0400  Weight: 79.334 kg (174 lb 14.4 oz) 78.926 kg (174 lb)    Exam:  General: Alert,  awake, oriented x3, in no acute distress.  HEENT: No bruits, no goiter.  Heart: Regular rate and rhythm..  Lungs: Good air movement, clear Abdomen: Soft, nontender, nondistended, positive bowel sounds.    Data Reviewed: Basic Metabolic Panel:  Recent Labs Lab 01/25/14 1406 01/25/14 2340  NA 136* 137  K 4.5 4.3  CL 101 102  CO2 24 24  GLUCOSE 114* 167*  BUN 20 21  CREATININE 0.68 0.63  CALCIUM 9.4 8.2*   Liver Function Tests: No results found for this basename: AST, ALT, ALKPHOS, BILITOT, PROT, ALBUMIN,  in the last 168 hours No results found for this basename: LIPASE, AMYLASE,  in the last 168 hours No results found for this basename: AMMONIA,  in the last 168 hours CBC:  Recent Labs Lab 01/25/14 1406  WBC 9.1  NEUTROABS 7.6  HGB 12.8  HCT 37.3  MCV 90.5  PLT 171   Cardiac Enzymes:  Recent Labs Lab 01/25/14 2340  TROPONINI <0.30   BNP (last 3 results) No results found for this basename: PROBNP,  in the last 8760 hours CBG: No results found for this basename: GLUCAP,  in the last 168 hours  No results found for this or any previous visit (from the past 240 hour(s)).   Studies: Dg Pelvis 1-2 Views  01/25/2014   CLINICAL DATA:  Fall, right upper leg pain  EXAM: PELVIS - 1-2 VIEW  COMPARISON:  Right femur radiographs same date, dictated separately.  FINDINGS: Right humeral arthroplasty partly visualized. No displaced pelvic fracture. Sacroiliac joints are within normal limits. Normal bowel gas pattern.  IMPRESSION: No acute pelvic fracture identified.   Electronically Signed   By: Christiana Pellant M.D.   On: 01/25/2014 15:07   Dg Forearm Right  01/25/2014   CLINICAL DATA:  Fall, forearm pain and swelling  EXAM: RIGHT FOREARM - 2 VIEW  COMPARISON:  Wrist radiographs same date  FINDINGS: There is extensive soft tissue edema about the proximal right forearm and elbow. No fracture of the radius or ulna is identified. No radiopaque foreign body. Age indeterminate ulnar  styloid process deformity seen on one view only.  IMPRESSION: Extensive soft tissue swelling about the proximal right forearm.   Electronically Signed   By: Christiana Pellant M.D.   On: 01/25/2014 15:05   Dg Wrist Complete Right  01/25/2014   CLINICAL DATA:  Fall, right wrist pain  EXAM: RIGHT WRIST - COMPLETE 3+ VIEW  COMPARISON:  None.  FINDINGS: The bones are subjectively osteopenic. Deformity of the ulnar styloid process is noted. Linear trabecular disruption is identified at the bases of the fourth and fifth metacarpals, seen on only one view. Carpal rows are normally aligned. First carpometacarpal joint degenerative change is noted.  IMPRESSION: Possible artifactual cortical disruption at the bases of the fourth and fifth metacarpals, correlate clinically for any point tenderness to this area and consider dedicated hand radiographs if there are symptoms referable to this area.  Age indeterminate deformity of the ulnar styloid process.   Electronically Signed   By: Christiana Pellant M.D.   On: 01/25/2014 15:04   Dg Femur Right  01/25/2014   CLINICAL DATA:  Fall, right hip pain and leg pain  EXAM: RIGHT FEMUR - 2 VIEW  COMPARISON:  None.  FINDINGS: Right total hip arthroplasty noted. No evidence for hardware failure. Bones are subjectively osteopenic. No femoral fracture is identified. Moderate tricompartmental right knee degenerative change noted.  IMPRESSION: No acute abnormality of the right femur.   Electronically Signed   By: Christiana Pellant M.D.   On: 01/25/2014 15:06   Ct Head Wo Contrast  01/25/2014   CLINICAL DATA:  Fall, near syncope  EXAM: CT HEAD WITHOUT CONTRAST  TECHNIQUE: Contiguous axial images were obtained from the base of the skull through the vertex without intravenous contrast.  COMPARISON:  08/08/2013  FINDINGS: No evidence of parenchymal hemorrhage or extra-axial fluid collection. No mass lesion, mass effect, or midline shift.  No CT evidence of acute infarction.  Mild Subcortical white  matter and periventricular small vessel ischemic changes. Intracranial atherosclerosis.  Cerebral volume is within normal limits.  No ventriculomegaly.  The visualized paranasal sinuses are essentially clear. The mastoid air cells are unopacified.  No evidence of calvarial fracture.  IMPRESSION: No evidence of acute intracranial abnormality.  Mild small vessel ischemic changes with intracranial atherosclerosis.   Electronically Signed   By: Charline Bills M.D.   On: 01/25/2014 18:49    Scheduled Meds: . aspirin EC  81 mg Oral Daily  . calcium carbonate  1 tablet Oral Q breakfast  . enoxaparin (LOVENOX) injection  40 mg Subcutaneous Q24H  . multivitamin with minerals  1 tablet Oral Daily  . multivitamin-lutein  1 capsule Oral Daily  . omega-3 acid ethyl esters  1 g Oral Daily  . sodium chloride  3 mL Intravenous Q12H  . timolol  1 drop Right Eye QHS   Continuous Infusions: . sodium chloride 75 mL/hr at 01/25/14  2123     Marinda Elk  [facility] Pager [phone]. If 8PM-8AM, please contact night-coverage at [email or web address], password [facility] 01/26/2014, 8:12 AM  LOS: 1 day      **Disclaimer: This note may have been dictated with voice recognition software. Similar sounding words can inadvertently be transcribed and this note may contain transcription errors which may not have been corrected upon publication of note.**

## 2014-01-26 NOTE — Progress Notes (Signed)
  Echocardiogram 2D Echocardiogram has been performed.  Regina Taylor FRANCES 01/26/2014, 3:29 PM

## 2014-01-26 NOTE — ED Provider Notes (Signed)
Medical screening examination/treatment/procedure(s) were performed by non-physician practitioner and as supervising physician I was immediately available for consultation/collaboration.   EKG Interpretation   Date/Time:  Monday January 25 2014 14:20:37 EDT Ventricular Rate:  56 PR Interval:  149 QRS Duration: 92 QT Interval:  473 QTC Calculation: 456 R Axis:   45 Text Interpretation:  Sinus bradycardia ECG OTHERWISE WITHIN NORMAL LIMITS  since last tracing no significant change Confirmed by Hyacinth Meeker  MD, BRIAN  [phone]) on 01/25/2014 2:25:00 PM        Gilda Crease, MD 01/26/14 1506

## 2014-01-27 DIAGNOSIS — Y92009 Unspecified place in unspecified non-institutional (private) residence as the place of occurrence of the external cause: Secondary | ICD-10-CM

## 2014-01-27 DIAGNOSIS — W19XXXA Unspecified fall, initial encounter: Secondary | ICD-10-CM

## 2014-01-27 DIAGNOSIS — IMO0001 Reserved for inherently not codable concepts without codable children: Secondary | ICD-10-CM

## 2014-01-27 DIAGNOSIS — R61 Generalized hyperhidrosis: Secondary | ICD-10-CM

## 2014-01-27 DIAGNOSIS — H538 Other visual disturbances: Secondary | ICD-10-CM | POA: Diagnosis present

## 2014-01-27 LAB — CORTISOL: CORTISOL PLASMA: 5.3 ug/dL

## 2014-01-27 NOTE — ED Provider Notes (Signed)
Medical screening examination/treatment/procedure(s) were conducted as a shared visit with non-physician practitioner(s) and myself.  I personally evaluated the patient during the encounter  Please see my separate respective documentation pertaining to this patient encounter   Vida Roller, MD 01/27/14 2324

## 2014-01-27 NOTE — Progress Notes (Signed)
Pt discharged home with family.  Reviewed discharge instructions and education, all questions answered.  Assessment unchanged from earlier.  

## 2014-01-27 NOTE — Discharge Summary (Signed)
Physician Discharge Summary  ALLEY NEILS ZOX:[id number] DOB: 26-Jan-1928 DOA: 01/25/2014  PCP: Allean Found, MD  Admit date: 01/25/2014 Discharge date: 01/27/2014  Time spent: >45 minutes  Recommendations for Outpatient Follow-up:  1. F/u with orthopedic service if arm swelling does not resolve  Discharge Condition: stable Diet recommendation: heart heatlhy  Discharge Diagnoses:  Principal Problem:   Blurred vision Active Problems:   Fall at home   Arthritis   SVT (supraventricular tachycardia)   MVP (mitral valve prolapse)   Blindness of left eye   Glaucoma   Injury of right upper arm   History of present illness:  This is an 80 y/o female with PMH of MVP, SVT, glaucoma, left eye blindness secondary to retinal detachment. She states she was walking her dog when she tripped and fell on the pavement. She fell onto her right side and injured her right arm. She tells me she did not lose consciousness. She was in the ER when she had a spell of dizziness, feeling hot and blurred vision which lasted less than a minute. She had another similar spell again that same day and it was decided to admit the patient.   Hospital Course:  Blurred vision - patient admits to a sensation of feeling funny, hot and then noting that her vision was blurred- she had 2 of these episodes and was admitted for further monitoring - Cardiac marker negative, EKG NSR  - ECHO: EF 65% -70% and grade 1 diastolic dysfunction, mild elevated PA pressure at 38mm Hg - Ct head: Mild small vessel ischemic changes with intracranial atherosclerosis.  - No events on telemetry,  U/a negative, B12 normal and cortisol 5.3 - TSH <1.0  - she has been ambulating in the hall and is asymptomatic and is anxious to go home- I would like to check orthostatics once more prior to leaving but she and her son are Adamant about discharged.   Right arm injury - xrays reviewed with ortho PA prior to d/c- no fracture noted -  recommended to place a wrist forearm splint which she should leave on for 1 wk to 10 days  Hx of SVT - no recurrence while in the hospital  Consultations:  none  Discharge Exam: Filed Weights   01/25/14 2018 01/26/14 0400  Weight: 79.334 kg (174 lb 14.4 oz) 78.926 kg (174 lb)   Filed Vitals:   01/27/14 0809  BP: 135/55  Pulse: 70  Temp: 98.2 F (36.8 C)  Resp: 18    General: AAO x 3, no distress Cardiovascular: RRR, no murmurs  Respiratory: clear to auscultation bilaterally GI: soft, non-tender, non-distended, bowel sound positive  Discharge Instructions You were cared for by a hospitalist during your hospital stay. If you have any questions about your discharge medications or the care you received while you were in the hospital after you are discharged, you can call the unit and asked to speak with the hospitalist on call if the hospitalist that took care of you is not available. Once you are discharged, your primary care physician will handle any further medical issues. Please note that NO REFILLS for any discharge medications will be authorized once you are discharged, as it is imperative that you return to your primary care physician (or establish a relationship with a primary care physician if you do not have one) for your aftercare needs so that they can reassess your need for medications and monitor your lab values.     Medication List  aspirin 81 MG tablet  Take 81 mg by mouth daily.     BION TEARS OP  Apply 1 drop to eye daily as needed (dry eyes).     CALTRATE 600 PO  Take 1 tablet by mouth daily.     fish oil-omega-3 fatty acids 1000 MG capsule  Take 1 g by mouth daily.     ICAPS PO  Take 1 capsule by mouth daily.     metoprolol succinate 50 MG 24 hr tablet  Commonly known as:  TOPROL-XL  Take 50 mg by mouth daily.     multivitamin tablet  Take 1 tablet by mouth daily.     RED YEAST RICE PO  Take 1 tablet by mouth daily.     SYSTANE  OVERNIGHT THERAPY 0.3 % Gel  Generic drug:  Hypromellose  Apply 1 drop to eye at bedtime.     TIMOPTIC OCUDOSE 0.5 % Soln  Generic drug:  Timolol Maleate PF  Place 1 drop into the right eye daily.       Allergies  Allergen Reactions  . Iodinated Diagnostic Agents Nausea And Vomiting  . Xalatan     Vision problems   Follow-up Information   Follow up with YATES,MARK C, MD. Schedule an appointment as soon as possible for a visit in 1 week. (For wound re-check)    Specialty:  Orthopedic Surgery   Contact information:   [address] [place] [address] [phone]       Follow up with [facility] EMERGENCY DEPARTMENT. (Return to ER for worsening conditions., Fever, extreme pain, numness or weakness)    Specialty:  Emergency Medicine   Contact information:   [address] [id number] [place] [address] [phone]       The results of significant diagnostics from this hospitalization (including imaging, microbiology, ancillary and laboratory) are listed below for reference.    Significant Diagnostic Studies: Dg Pelvis 1-2 Views  01/25/2014   CLINICAL DATA:  Fall, right upper leg pain  EXAM: PELVIS - 1-2 VIEW  COMPARISON:  Right femur radiographs same date, dictated separately.  FINDINGS: Right humeral arthroplasty partly visualized. No displaced pelvic fracture. Sacroiliac joints are within normal limits. Normal bowel gas pattern.  IMPRESSION: No acute pelvic fracture identified.   Electronically Signed   By: Christiana Pellant M.D.   On: 01/25/2014 15:07   Dg Forearm Right  01/25/2014   CLINICAL DATA:  Fall, forearm pain and swelling  EXAM: RIGHT FOREARM - 2 VIEW  COMPARISON:  Wrist radiographs same date  FINDINGS: There is extensive soft tissue edema about the proximal right forearm and elbow. No fracture of the radius or ulna is identified. No radiopaque foreign body. Age indeterminate ulnar styloid process deformity seen on one view only.   IMPRESSION: Extensive soft tissue swelling about the proximal right forearm.   Electronically Signed   By: Christiana Pellant M.D.   On: 01/25/2014 15:05   Dg Wrist Complete Right  01/25/2014   CLINICAL DATA:  Fall, right wrist pain  EXAM: RIGHT WRIST - COMPLETE 3+ VIEW  COMPARISON:  None.  FINDINGS: The bones are subjectively osteopenic. Deformity of the ulnar styloid process is noted. Linear trabecular disruption is identified at the bases of the fourth and fifth metacarpals, seen on only one view. Carpal rows are normally aligned. First carpometacarpal joint degenerative change is noted.  IMPRESSION: Possible artifactual cortical disruption at the bases of the fourth and fifth metacarpals, correlate clinically for any point  tenderness to this area and consider dedicated hand radiographs if there are symptoms referable to this area.  Age indeterminate deformity of the ulnar styloid process.   Electronically Signed   By: Christiana Pellant M.D.   On: 01/25/2014 15:04   Dg Femur Right  01/25/2014   CLINICAL DATA:  Fall, right hip pain and leg pain  EXAM: RIGHT FEMUR - 2 VIEW  COMPARISON:  None.  FINDINGS: Right total hip arthroplasty noted. No evidence for hardware failure. Bones are subjectively osteopenic. No femoral fracture is identified. Moderate tricompartmental right knee degenerative change noted.  IMPRESSION: No acute abnormality of the right femur.   Electronically Signed   By: Christiana Pellant M.D.   On: 01/25/2014 15:06   Ct Head Wo Contrast  01/25/2014   CLINICAL DATA:  Fall, near syncope  EXAM: CT HEAD WITHOUT CONTRAST  TECHNIQUE: Contiguous axial images were obtained from the base of the skull through the vertex without intravenous contrast.  COMPARISON:  08/08/2013  FINDINGS: No evidence of parenchymal hemorrhage or extra-axial fluid collection. No mass lesion, mass effect, or midline shift.  No CT evidence of acute infarction.  Mild Subcortical white matter and periventricular small vessel ischemic  changes. Intracranial atherosclerosis.  Cerebral volume is within normal limits.  No ventriculomegaly.  The visualized paranasal sinuses are essentially clear. The mastoid air cells are unopacified.  No evidence of calvarial fracture.  IMPRESSION: No evidence of acute intracranial abnormality.  Mild small vessel ischemic changes with intracranial atherosclerosis.   Electronically Signed   By: Charline Bills M.D.   On: 01/25/2014 18:49    Microbiology: No results found for this or any previous visit (from the past 240 hour(s)).   Labs: Basic Metabolic Panel:  Recent Labs Lab 01/25/14 1406 01/25/14 2340  NA 136* 137  K 4.5 4.3  CL 101 102  CO2 24 24  GLUCOSE 114* 167*  BUN 20 21  CREATININE 0.68 0.63  CALCIUM 9.4 8.2*   Liver Function Tests: No results found for this basename: AST, ALT, ALKPHOS, BILITOT, PROT, ALBUMIN,  in the last 168 hours No results found for this basename: LIPASE, AMYLASE,  in the last 168 hours No results found for this basename: AMMONIA,  in the last 168 hours CBC:  Recent Labs Lab 01/25/14 1406  WBC 9.1  NEUTROABS 7.6  HGB 12.8  HCT 37.3  MCV 90.5  PLT 171   Cardiac Enzymes:  Recent Labs Lab 01/25/14 2340  TROPONINI <0.30   BNP: BNP (last 3 results) No results found for this basename: PROBNP,  in the last 8760 hours CBG: No results found for this basename: GLUCAP,  in the last 168 hours     Signed:  Calvert Cantor, MD [facility] 01/27/2014, 11:31 AM

## 2014-03-05 ENCOUNTER — Other Ambulatory Visit: Payer: Self-pay

## 2014-11-15 ENCOUNTER — Other Ambulatory Visit: Payer: Self-pay

## 2019-06-05 ENCOUNTER — Ambulatory Visit: Payer: Medicare Other | Attending: Internal Medicine

## 2019-06-05 DIAGNOSIS — Z23 Encounter for immunization: Secondary | ICD-10-CM | POA: Insufficient documentation

## 2019-06-05 NOTE — Progress Notes (Signed)
   Covid-19 Vaccination Clinic  Name:  Regina Taylor    MRN: [id number] DOB: 1928/01/08  06/05/2019  [name] was observed post Covid-19 immunization for 15 minutes without incidence. She was provided with Vaccine Information Sheet and instruction to access the V-Safe system.   [name] was instructed to call 911 with any severe reactions post vaccine: Marland Kitchen Difficulty breathing  . Swelling of your face and throat  . A fast heartbeat  . A bad rash all over your body  . Dizziness and weakness    Immunizations Administered    Name Date Dose VIS Date Route   Pfizer COVID-19 Vaccine 06/05/2019 12:04 PM 0.3 mL 05/01/2019 Intramuscular   Manufacturer: [employer], [employer]   Lot: [id number]   NDC: [address]-3

## 2019-06-26 ENCOUNTER — Ambulatory Visit: Payer: Medicare PPO | Attending: Internal Medicine

## 2019-06-26 DIAGNOSIS — Z23 Encounter for immunization: Secondary | ICD-10-CM | POA: Insufficient documentation

## 2019-06-26 NOTE — Progress Notes (Signed)
   Covid-19 Vaccination Clinic  Name:  Regina Taylor    MRN: [id number] DOB: [birth year]  06/26/2019  [name] was observed post Covid-19 immunization for 15 minutes without incidence. She was provided with Vaccine Information Sheet and instruction to access the V-Safe system.   [name] was instructed to call 911 with any severe reactions post vaccine: Marland Kitchen Difficulty breathing  . Swelling of your face and throat  . A fast heartbeat  . A bad rash all over your body  . Dizziness and weakness    Immunizations Administered    Name Date Dose VIS Date Route   Pfizer COVID-19 Vaccine 06/26/2019 12:39 PM 0.3 mL 05/01/2019 Intramuscular   Manufacturer: [employer], [employer]   Lot: [id number]   NDC: [address]-3

## 2019-09-16 DIAGNOSIS — K219 Gastro-esophageal reflux disease without esophagitis: Secondary | ICD-10-CM | POA: Diagnosis not present

## 2019-09-16 DIAGNOSIS — I10 Essential (primary) hypertension: Secondary | ICD-10-CM | POA: Diagnosis not present

## 2019-09-16 DIAGNOSIS — Z23 Encounter for immunization: Secondary | ICD-10-CM | POA: Diagnosis not present

## 2019-09-16 DIAGNOSIS — E7800 Pure hypercholesterolemia, unspecified: Secondary | ICD-10-CM | POA: Diagnosis not present

## 2019-09-16 DIAGNOSIS — Z1389 Encounter for screening for other disorder: Secondary | ICD-10-CM | POA: Diagnosis not present

## 2019-09-16 DIAGNOSIS — Z0000 Encounter for general adult medical examination without abnormal findings: Secondary | ICD-10-CM | POA: Diagnosis not present

## 2019-09-16 DIAGNOSIS — Z8679 Personal history of other diseases of the circulatory system: Secondary | ICD-10-CM | POA: Diagnosis not present

## 2019-11-26 DIAGNOSIS — H4421 Degenerative myopia, right eye: Secondary | ICD-10-CM | POA: Diagnosis not present

## 2019-11-26 DIAGNOSIS — H04123 Dry eye syndrome of bilateral lacrimal glands: Secondary | ICD-10-CM | POA: Diagnosis not present

## 2019-11-26 DIAGNOSIS — H31091 Other chorioretinal scars, right eye: Secondary | ICD-10-CM | POA: Diagnosis not present

## 2019-11-26 DIAGNOSIS — H53411 Scotoma involving central area, right eye: Secondary | ICD-10-CM | POA: Diagnosis not present

## 2019-11-26 DIAGNOSIS — Z970 Presence of artificial eye: Secondary | ICD-10-CM | POA: Diagnosis not present

## 2019-11-26 DIAGNOSIS — H18421 Band keratopathy, right eye: Secondary | ICD-10-CM | POA: Diagnosis not present

## 2019-11-26 DIAGNOSIS — H35371 Puckering of macula, right eye: Secondary | ICD-10-CM | POA: Diagnosis not present

## 2019-12-17 DIAGNOSIS — H401113 Primary open-angle glaucoma, right eye, severe stage: Secondary | ICD-10-CM | POA: Diagnosis not present

## 2019-12-17 DIAGNOSIS — H16101 Unspecified superficial keratitis, right eye: Secondary | ICD-10-CM | POA: Diagnosis not present

## 2019-12-17 DIAGNOSIS — Z961 Presence of intraocular lens: Secondary | ICD-10-CM | POA: Diagnosis not present

## 2019-12-17 DIAGNOSIS — H04121 Dry eye syndrome of right lacrimal gland: Secondary | ICD-10-CM | POA: Diagnosis not present

## 2020-03-17 ENCOUNTER — Emergency Department (HOSPITAL_COMMUNITY): Payer: Medicare PPO

## 2020-03-17 ENCOUNTER — Encounter (HOSPITAL_COMMUNITY): Payer: Self-pay

## 2020-03-17 ENCOUNTER — Inpatient Hospital Stay (HOSPITAL_COMMUNITY)
Admission: EM | Admit: 2020-03-17 | Discharge: 2020-03-20 | DRG: 308 | Disposition: A | Discharge status: Home / Self Care | Payer: Medicare PPO | Attending: Internal Medicine | Admitting: Internal Medicine

## 2020-03-17 ENCOUNTER — Other Ambulatory Visit: Payer: Self-pay

## 2020-03-17 DIAGNOSIS — Z87891 Personal history of nicotine dependence: Secondary | ICD-10-CM | POA: Diagnosis not present

## 2020-03-17 DIAGNOSIS — Z91041 Radiographic dye allergy status: Secondary | ICD-10-CM

## 2020-03-17 DIAGNOSIS — Z79899 Other long term (current) drug therapy: Secondary | ICD-10-CM | POA: Diagnosis not present

## 2020-03-17 DIAGNOSIS — I5031 Acute diastolic (congestive) heart failure: Secondary | ICD-10-CM | POA: Diagnosis not present

## 2020-03-17 DIAGNOSIS — R000 Tachycardia, unspecified: Secondary | ICD-10-CM | POA: Diagnosis not present

## 2020-03-17 DIAGNOSIS — Z888 Allergy status to other drugs, medicaments and biological substances status: Secondary | ICD-10-CM | POA: Diagnosis not present

## 2020-03-17 DIAGNOSIS — Z8249 Family history of ischemic heart disease and other diseases of the circulatory system: Secondary | ICD-10-CM

## 2020-03-17 DIAGNOSIS — K219 Gastro-esophageal reflux disease without esophagitis: Secondary | ICD-10-CM | POA: Diagnosis present

## 2020-03-17 DIAGNOSIS — R0602 Shortness of breath: Secondary | ICD-10-CM

## 2020-03-17 DIAGNOSIS — H5462 Unqualified visual loss, left eye, normal vision right eye: Secondary | ICD-10-CM | POA: Diagnosis present

## 2020-03-17 DIAGNOSIS — Z20822 Contact with and (suspected) exposure to covid-19: Secondary | ICD-10-CM | POA: Diagnosis not present

## 2020-03-17 DIAGNOSIS — I517 Cardiomegaly: Secondary | ICD-10-CM | POA: Diagnosis not present

## 2020-03-17 DIAGNOSIS — I471 Supraventricular tachycardia: Secondary | ICD-10-CM | POA: Diagnosis present

## 2020-03-17 DIAGNOSIS — Z9181 History of falling: Secondary | ICD-10-CM

## 2020-03-17 DIAGNOSIS — Z7901 Long term (current) use of anticoagulants: Secondary | ICD-10-CM | POA: Diagnosis not present

## 2020-03-17 DIAGNOSIS — R002 Palpitations: Secondary | ICD-10-CM | POA: Diagnosis not present

## 2020-03-17 DIAGNOSIS — I509 Heart failure, unspecified: Secondary | ICD-10-CM

## 2020-03-17 DIAGNOSIS — R059 Cough, unspecified: Secondary | ICD-10-CM | POA: Diagnosis not present

## 2020-03-17 DIAGNOSIS — I272 Pulmonary hypertension, unspecified: Secondary | ICD-10-CM | POA: Diagnosis present

## 2020-03-17 DIAGNOSIS — K529 Noninfective gastroenteritis and colitis, unspecified: Secondary | ICD-10-CM | POA: Diagnosis not present

## 2020-03-17 DIAGNOSIS — J90 Pleural effusion, not elsewhere classified: Secondary | ICD-10-CM | POA: Diagnosis not present

## 2020-03-17 DIAGNOSIS — Z96641 Presence of right artificial hip joint: Secondary | ICD-10-CM | POA: Diagnosis present

## 2020-03-17 DIAGNOSIS — I10 Essential (primary) hypertension: Secondary | ICD-10-CM | POA: Diagnosis not present

## 2020-03-17 DIAGNOSIS — H409 Unspecified glaucoma: Secondary | ICD-10-CM | POA: Diagnosis present

## 2020-03-17 DIAGNOSIS — E871 Hypo-osmolality and hyponatremia: Secondary | ICD-10-CM | POA: Diagnosis present

## 2020-03-17 DIAGNOSIS — R609 Edema, unspecified: Secondary | ICD-10-CM | POA: Diagnosis not present

## 2020-03-17 DIAGNOSIS — I4891 Unspecified atrial fibrillation: Principal | ICD-10-CM | POA: Diagnosis present

## 2020-03-17 DIAGNOSIS — E7800 Pure hypercholesterolemia, unspecified: Secondary | ICD-10-CM | POA: Diagnosis not present

## 2020-03-17 DIAGNOSIS — I5189 Other ill-defined heart diseases: Secondary | ICD-10-CM | POA: Diagnosis not present

## 2020-03-17 LAB — CBC WITH DIFFERENTIAL/PLATELET
Abs Immature Granulocytes: 0.01 10*3/uL (ref 0.00–0.07)
Basophils Absolute: 0.1 10*3/uL (ref 0.0–0.1)
Basophils Relative: 1 %
Eosinophils Absolute: 0.1 10*3/uL (ref 0.0–0.5)
Eosinophils Relative: 2 %
HCT: 40 % (ref 36.0–46.0)
Hemoglobin: 13.2 g/dL (ref 12.0–15.0)
Immature Granulocytes: 0 %
Lymphocytes Relative: 23 %
Lymphs Abs: 1.2 10*3/uL (ref 0.7–4.0)
MCH: 30.8 pg (ref 26.0–34.0)
MCHC: 33 g/dL (ref 30.0–36.0)
MCV: 93.2 fL (ref 80.0–100.0)
Monocytes Absolute: 0.6 10*3/uL (ref 0.1–1.0)
Monocytes Relative: 10 %
Neutro Abs: 3.4 10*3/uL (ref 1.7–7.7)
Neutrophils Relative %: 64 %
Platelets: 190 10*3/uL (ref 150–400)
RBC: 4.29 MIL/uL (ref 3.87–5.11)
RDW: 13.1 % (ref 11.5–15.5)
WBC: 5.4 10*3/uL (ref 4.0–10.5)
nRBC: 0 % (ref 0.0–0.2)

## 2020-03-17 LAB — BASIC METABOLIC PANEL
Anion gap: 9 (ref 5–15)
BUN: 10 mg/dL (ref 8–23)
CO2: 24 mmol/L (ref 22–32)
Calcium: 9.3 mg/dL (ref 8.9–10.3)
Chloride: 103 mmol/L (ref 98–111)
Creatinine, Ser: 0.67 mg/dL (ref 0.44–1.00)
GFR, Estimated: >60 mL/min (ref >60)
Glucose, Bld: 113 mg/dL — ABNORMAL HIGH (ref 70–99)
Potassium: 4.3 mmol/L (ref 3.5–5.1)
Sodium: 136 mmol/L (ref 135–145)

## 2020-03-17 LAB — BRAIN NATRIURETIC PEPTIDE: B Natriuretic Peptide: 417.4 pg/mL — ABNORMAL HIGH (ref 0.0–100.0)

## 2020-03-17 LAB — TSH: TSH: 1.341 u[IU]/mL (ref 0.350–4.500)

## 2020-03-17 LAB — LACTIC ACID, PLASMA
Lactic Acid, Venous: 1.5 mmol/L (ref 0.5–1.9)
Lactic Acid, Venous: 1.4 mmol/L (ref 0.5–1.9)

## 2020-03-17 LAB — MAGNESIUM: Magnesium: <0.1 mg/dL — CL (ref 1.7–2.4)

## 2020-03-17 LAB — RESPIRATORY PANEL BY RT PCR (FLU A&B, COVID)
Influenza A by PCR: NEGATIVE
Influenza B by PCR: NEGATIVE
SARS Coronavirus 2 by RT PCR: NEGATIVE

## 2020-03-17 LAB — TROPONIN I (HIGH SENSITIVITY)
Troponin I (High Sensitivity): 12 ng/L (ref <18)
Troponin I (High Sensitivity): 14 ng/L (ref <18)

## 2020-03-17 LAB — T4, FREE: Free T4: 1.21 ng/dL — ABNORMAL HIGH (ref 0.61–1.12)

## 2020-03-17 MED ORDER — ACETAMINOPHEN 325 MG PO TABS
650.0000 mg | ORAL_TABLET | ORAL | Status: DC | PRN
  Administered 2020-03-17: 650 mg via ORAL
  Filled 2020-03-17: qty 2

## 2020-03-17 MED ORDER — ONDANSETRON HCL 4 MG/2ML IJ SOLN: 4.0000 mg | Freq: Four times a day (QID) | INTRAMUSCULAR | Status: DC | PRN

## 2020-03-17 MED ORDER — ASPIRIN 81 MG PO CHEW
81.0000 mg | CHEWABLE_TABLET | Freq: Every day | ORAL | Status: DC
  Administered 2020-03-18: 81 mg via ORAL
  Filled 2020-03-17: qty 1

## 2020-03-17 MED ORDER — MAGNESIUM SULFATE 2 GM/50ML IV SOLN
2.0000 g | Freq: Once | INTRAVENOUS | Status: AC
  Administered 2020-03-17: 2 g via INTRAVENOUS
  Filled 2020-03-17: qty 50

## 2020-03-17 MED ORDER — METOPROLOL TARTRATE 5 MG/5ML IV SOLN
5.0000 mg | Freq: Once | INTRAVENOUS | Status: AC
  Administered 2020-03-17: 5 mg via INTRAVENOUS
  Filled 2020-03-17: qty 5

## 2020-03-17 MED ORDER — DILTIAZEM LOAD VIA INFUSION
10.0000 mg | Freq: Once | INTRAVENOUS | Status: AC
  Administered 2020-03-17: 10 mg via INTRAVENOUS
  Filled 2020-03-17: qty 10

## 2020-03-17 MED ORDER — DILTIAZEM HCL-DEXTROSE 125-5 MG/125ML-% IV SOLN (PREMIX)
5.0000 mg/h | INTRAVENOUS | Status: AC
  Administered 2020-03-17: 5 mg/h via INTRAVENOUS
  Administered 2020-03-17 – 2020-03-18 (×3): 15 mg/h via INTRAVENOUS
  Filled 2020-03-17 (×5): qty 125

## 2020-03-17 NOTE — ED Notes (Signed)
Dinner Tray Ordered @ 1657. 

## 2020-03-17 NOTE — H&P (Signed)
History and Physical    Regina Taylor PPI:[id number] DOB: [birth year] DOA: 03/17/2020  PCP: Merri Brunette, MD    Patient coming from: PCP office.   Chief Complaint: SOB.   HPI: Regina Taylor is a 86 y.o. female with medical history significant of  GERD, glaucoma, history of palpitations and SVT in the emergency room for shortness of breath and orthopnea that has been going on for the past few days patient went to her PCP today and was found to be in atrial fibrillation with elevated heart rate in the 120s.Pt reports that since summer she has been not doing well and has had diarrhea. 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 Pt also has h/o glaucoma. She is 86 y/o but is a active 89 and is abel to do her ADL.   ED Course: Blood pressure (!) 146/99, pulse (!) 120, temperature 97.9 F (36.6 C), temperature source Oral, resp. rate (!) 31, SpO2 95 %. SpO2: 95 % In ed pt received metoprolol and started on Cardizem drip for her new atrial fibrillation with rvr. Blood work shows BMP showing glucose of 113, magnesium level that is undetectable and patient did receive magnesium in the emergency room, elevated BNP of 417, initial troponin high-sensitivity at 12, CBC that is normal, patient's TSH is normal 1.341.  Chest x-ray imaging showed Cardiomegaly and Diffuse bilateral interstitial prominence. Interstitial edema and/or pneumonitis could present in this fashion.   Review of Systems: As per HPI otherwise all systems reviewed and negative.  Past Medical History:  Diagnosis Date  . Arthritis   . Blindness of left eye   . Fatigue   . GERD (gastroesophageal reflux disease)   . Glaucoma   . Macular degeneration   . MVP (mitral valve prolapse)   . Palpitations   . SVT (supraventricular tachycardia) (HCC)     Past Surgical History:  Procedure  Laterality Date  . CATARAC    . CATARACT EXTRACTION    . PARTIAL HIP ARTHROPLASTY    . RETINAL DETACHMENT SURGERY    . TOTAL HIP ARTHROPLASTY Right 1990     reports that she quit smoking about 40 years ago. She has never used smokeless tobacco. She reports current alcohol use. She reports that she does not use drugs.  Allergies  Allergen Reactions  . Iodinated Diagnostic Agents Nausea And Vomiting  . Xalatan     Vision problems    Family History  Problem Relation Age of Onset  . Cancer Mother   . Heart disease Father   . Heart attack Brother     Prior to Admission medications   Medication Sig Start Date End Date Taking? Authorizing Provider  Artificial Tear Solution (BION TEARS OP) Apply 1 drop to eye daily as needed (dry eyes).   Yes [provider]  fish oil-omega-3 fatty acids 1000 MG capsule Take 1 g by mouth daily.    Yes [provider]  Hypromellose (SYSTANE OVERNIGHT THERAPY) 0.3 % GEL Apply 1 drop to eye at bedtime.   Yes [provider]  metoprolol (TOPROL-XL) 50 MG 24 hr tablet Take 50 mg by mouth every evening.    Yes [provider]  Multiple Vitamin (MULTIVITAMIN) tablet Take 1 tablet by mouth daily.    Yes [provider]  Multiple Vitamins-Minerals (ICAPS PO) Take 1 capsule by  mouth daily.    Yes [provider]  Red Yeast Rice Extract (RED YEAST RICE PO) Take 1 tablet by mouth daily.    Yes [provider]  TIMOPTIC OCUDOSE 0.5 % SOLN Place 1 drop into the right eye daily.  11/28/13  Yes [provider]    Physical Exam: Vitals:   03/17/20 1545 03/17/20 1600 03/17/20 1626 03/17/20 1627  BP: 126/86 118/86 (!) 146/99   Pulse: (!) 129 (!) 115 (!) 130 (!) 120  Resp: (!) 33 (!) 29 (!) 22 (!) 31  Temp:      TempSrc:      SpO2: 96% 96% 92% 95%    Constitutional: NAD, calm, comfortable Vitals:   03/17/20 1545 03/17/20 1600 03/17/20 1626 03/17/20 1627  BP: 126/86 118/86 (!) 146/99    Pulse: (!) 129 (!) 115 (!) 130 (!) 120  Resp: (!) 33 (!) 29 (!) 22 (!) 31  Temp:      TempSrc:      SpO2: 96% 96% 92% 95%   Eyes: PERRL, EOMI lids and conjunctivae normal ENMT: Mucous membranes are moist. Posterior pharynx clear of any exudate or lesions.Normal dentition.  Neck: normal, supple, no masses, no thyromegaly, no carotid bruit  Respiratory: clear to auscultation bilaterally, no wheezing, no crackles. Normal respiratory effort. No accessory muscle use.  Cardiovascular: Regular rate and rhythm, no murmurs / rubs / gallops. No extremity edema. 2+ pedal pulses. No carotid bruits.  Abdomen: no tenderness, no masses palpated. No hepatosplenomegaly. Bowel sounds positive.  Musculoskeletal: no clubbing / cyanosis. No joint deformity upper and lower extremities. Pt moving all four ext , no contractures. Normal muscle tone.  Skin: no rashes, lesions, ulcers. No induration Neurologic: CN 2-12 grossly intact. Sensation intact, DTR normal. Strength 5/5 in all 4.  Psychiatric: Normal judgment and insight. Alert and oriented x 3. Normal mood.   Labs on Admission: I have personally reviewed following labs and imaging studies  CBC: Recent Labs  Lab 03/17/20 1444  WBC 5.4  NEUTROABS 3.4  HGB 13.2  HCT 40.0  MCV 93.2  PLT 190   Basic Metabolic Panel: Recent Labs  Lab 03/17/20 1444  NA 136  K 4.3  CL 103  CO2 24  GLUCOSE 113*  BUN 10  CREATININE 0.67  CALCIUM 9.3  MG <0.1*   GFR: CrCl cannot be calculated (Unknown ideal weight.). Liver Function Tests: No results for input(s): AST, ALT, ALKPHOS, BILITOT, PROT, ALBUMIN in the last 168 hours. No results for input(s): LIPASE, AMYLASE in the last 168 hours. No results for input(s): AMMONIA in the last 168 hours. Coagulation Profile: No results for input(s): INR, PROTIME in the last 168 hours. Cardiac Enzymes: No results for input(s): CKTOTAL, CKMB, CKMBINDEX, TROPONINI in the last 168 hours. BNP (last 3 results) No results  for input(s): PROBNP in the last 8760 hours. HbA1C: No results for input(s): HGBA1C in the last 72 hours. CBG: No results for input(s): GLUCAP in the last 168 hours. Lipid Profile: No results for input(s): CHOL, HDL, LDLCALC, TRIG, CHOLHDL, LDLDIRECT in the last 72 hours. Thyroid Function Tests: Recent Labs    03/17/20 1420  TSH 1.341   Anemia Panel: No results for input(s): VITAMINB12, FOLATE, FERRITIN, TIBC, IRON, RETICCTPCT in the last 72 hours. Urine analysis:    Component Value Date/Time   COLORURINE YELLOW 01/26/2014 0929   APPEARANCEUR CLEAR 01/26/2014 0929   LABSPEC 1.020 01/26/2014 0929   PHURINE 6.0 01/26/2014 0929   GLUCOSEU NEGATIVE 01/26/2014 8182  HGBUR NEGATIVE 01/26/2014 0929   BILIRUBINUR NEGATIVE 01/26/2014 0929   KETONESUR NEGATIVE 01/26/2014 0929   PROTEINUR NEGATIVE 01/26/2014 0929   UROBILINOGEN 0.2 01/26/2014 0929   NITRITE NEGATIVE 01/26/2014 0929   LEUKOCYTESUR SMALL (A) 01/26/2014 0929   No intake or output data in the 24 hours ending 03/17/20 1641 Lab Results  Component Value Date   CREATININE 0.67 03/17/2020   CREATININE 0.63 01/25/2014   CREATININE 0.68 01/25/2014    COVID-19 Labs  No results for input(s): DDIMER, FERRITIN, LDH, CRP in the last 72 hours.  No results found for: SARSCOV2NAA  Radiological Exams on Admission: DG Chest Port 1 View  Result Date: 03/17/2020 CLINICAL DATA:  Shortness of breath.  Cough. EXAM: PORTABLE CHEST 1 VIEW COMPARISON:  No prior. FINDINGS: Cardiomegaly. No pulmonary venous congestion. Diffuse bilateral interstitial prominence. Interstitial edema and/or pneumonitis could present this fashion. Tiny bilateral pleural effusions can not be excluded. No pneumothorax. Thoracic spine scoliosis. IMPRESSION: 1. Cardiomegaly. No pulmonary venous congestion. 2. Diffuse bilateral interstitial prominence. Interstitial edema and/or pneumonitis could present in this fashion. Electronically Signed   By: Maisie Fus  Register    On: 03/17/2020 14:23    EKG: Independently reviewed.  A.fib @116  and lvh and twi in V1.   Assessment/Plan  Atrial fibrillation with RVR (HCC) Will obtain a 2D echocardiogram, admit patient to cardiac telemetry bed, will continue patient on her Cardizem drip.  Patient has a CHA2DS2-VASc score of 3 putting her at 3.2% risk for VTE.  We will start patient on aspirin 81 currently and further anticoagulation per a.m. MD after discussion of risk and benefits. We will continue metoprolol once pt is off her drip. TFT and electrolytes.  Congestive heart failure: Suspect patient has new onset CHF exacerbated by atrial fibrillation. Obtain a 2D echo for assessment of ejection fraction. As needed diuretic therapy with strict I's and O's and daily weights.  Diarrhea: Pt has had diarrhea since the summer, and still continues to have diarrhea which is most likely causing her low magnesium. We will encourage po intake as far as fluids.   Hypomagnesemia:  Patient given 2 g magnesium in the emergency room We will recheck levels and follow.  Glaucoma:  Continue home regimen with eyedrops.   DVT prophylaxis:  Heparin.  Code Status:  Full Code  Family Communication:  None at bedside  Disposition Plan:  TBD  Consults called:  None  Admission status: Inpatient     03-16-1976 MD [facility] Pager [phone] If 7PM-7AM, please contact night-coverage [email or web address] Password [facility] 03/17/2020, 4:41 PM

## 2020-03-17 NOTE — ED Provider Notes (Addendum)
Received signout at the beginning of shift, please see previous providers notes for complete H&P.  In short this is a 86 year old female sent here from her PCPs office for evaluation of shortness of breath and elevated blood pressure.  Shortness of breath has been ongoing issue for a period of time.  Patient was also found to have an elevated heart rate in the 130s at PCPs office.  Patient was found to be in atrial fibrillation with RVR.  She received IV metoprolol and a Cardizem drip was started.  Initial chest x-ray shows findings suggestive of heart failure.  BNP is 417.  Patient has a critical low magnesium of less than 0.01.  Normal potassium level.  We will give magnesium supplementation.  Initial troponin is normal.  Normal H&H, electrolyte panels are reassuring otherwise.  Normal lactic acid, normal TSH.  4:06 PM At this time patient is resting comfortably, she is receiving treatment for her atrial fibrillation with RVR.  Heart rate improved.  She will be given supplementation for her low magnesium level.  Normal QT on exam.  She has no prior history of CHF.  Last echocardiogram was in 2015 that appears fairly normal.  Mild mitral valve prolapse.  She would likely benefit from another echocardiogram for her new onset heart failure.  Appreciate consultation from [facility] hospital[facility], Dr. Allena Katz, who agrees to see and admit patient for further care.  CRITICAL CARE Performed by: Fayrene Helper Total critical care time: 32 minutes Critical care time was exclusive of separately billable procedures and treating other patients. Critical care was necessary to treat or prevent imminent or life-threatening deterioration. Critical care was time spent personally by me on the following activities: development of treatment plan with patient and/or surrogate as well as nursing, discussions with consultants, evaluation of patient's response to treatment, examination of patient, obtaining history from patient or  surrogate, ordering and performing treatments and interventions, ordering and review of laboratory studies, ordering and review of radiographic studies, pulse oximetry and re-evaluation of patient's condition.  BP (!) 170/103   Pulse (!) 107   Temp 97.9 F (36.6 C) (Oral)   Resp (!) 22   SpO2 98%   Results for orders placed or performed during the hospital encounter of 03/17/20  CBC with Differential  Result Value Ref Range   WBC 5.4 4.0 - 10.5 K/uL   RBC 4.29 3.87 - 5.11 MIL/uL   Hemoglobin 13.2 12.0 - 15.0 g/dL   HCT 37.6 36 - 46 %   MCV 93.2 80.0 - 100.0 fL   MCH 30.8 26.0 - 34.0 pg   MCHC 33.0 30.0 - 36.0 g/dL   RDW 28.3 15.1 - 76.1 %   Platelets 190 150 - 400 K/uL   nRBC 0.0 0.0 - 0.2 %   Neutrophils Relative % 64 %   Neutro Abs 3.4 1.7 - 7.7 K/uL   Lymphocytes Relative 23 %   Lymphs Abs 1.2 0.7 - 4.0 K/uL   Monocytes Relative 10 %   Monocytes Absolute 0.6 0.1 - 1.0 K/uL   Eosinophils Relative 2 %   Eosinophils Absolute 0.1 0.0 - 0.5 K/uL   Basophils Relative 1 %   Basophils Absolute 0.1 0.0 - 0.1 K/uL   Immature Granulocytes 0 %   Abs Immature Granulocytes 0.01 0.00 - 0.07 K/uL  Basic metabolic panel  Result Value Ref Range   Sodium 136 135 - 145 mmol/L   Potassium 4.3 3.5 - 5.1 mmol/L   Chloride 103 98 - 111 mmol/L  CO2 24 22 - 32 mmol/L   Glucose, Bld 113 (H) 70 - 99 mg/dL   BUN 10 8 - 23 mg/dL   Creatinine, Ser 8.45 0.44 - 1.00 mg/dL   Calcium 9.3 8.9 - 36.4 mg/dL   GFR, Estimated >68 >03 mL/min   Anion gap 9 5 - 15  TSH  Result Value Ref Range   TSH 1.341 0.350 - 4.500 uIU/mL  Brain natriuretic peptide  Result Value Ref Range   B Natriuretic Peptide 417.4 (H) 0.0 - 100.0 pg/mL  Lactic acid, plasma  Result Value Ref Range   Lactic Acid, Venous 1.5 0.5 - 1.9 mmol/L  Magnesium  Result Value Ref Range   Magnesium <0.1 (LL) 1.7 - 2.4 mg/dL  Troponin I (High Sensitivity)  Result Value Ref Range   Troponin I (High Sensitivity) 12 <18 ng/L   DG Chest  Port 1 View  Result Date: 03/17/2020 CLINICAL DATA:  Shortness of breath.  Cough. EXAM: PORTABLE CHEST 1 VIEW COMPARISON:  No prior. FINDINGS: Cardiomegaly. No pulmonary venous congestion. Diffuse bilateral interstitial prominence. Interstitial edema and/or pneumonitis could present this fashion. Tiny bilateral pleural effusions can not be excluded. No pneumothorax. Thoracic spine scoliosis. IMPRESSION: 1. Cardiomegaly. No pulmonary venous congestion. 2. Diffuse bilateral interstitial prominence. Interstitial edema and/or pneumonitis could present in this fashion. Electronically Signed   By: Maisie Fus  Register   On: 03/17/2020 14:23         Fayrene Helper, PA-C 03/17/20 1607    Fayrene Helper, PA-C 03/17/20 1609    Pollyann Savoy, MD 03/17/20 [phone]

## 2020-03-17 NOTE — ED Provider Notes (Signed)
[facility] EMERGENCY DEPARTMENT Provider Note   CSN: [id number] Arrival date & time: 03/17/20  1325     History No chief complaint on file.   Regina Taylor is a 86 y.o. female.  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  Patient states that she had a routine checkup and talked about several complaints.  Among these were shortness of breath that she has been having for at least the last month with exertion.  No associated chest pain or chest tightness, however she notes that she cannot walk as far, for instance when she is walking her dog.  She also has difficulty with shortness of breath while lying flat.  She denies lower extremity edema, fever.  She has had a persistent cough during this time.  Blood pressure was noted to be elevated into the 180 systolic range at the doctor's office.  Her heart rate was also elevated into the 130s.  Patient has had ongoing nausea and diarrhea without vomiting.  She was concerned that she was developing irritable bowel disease.        Past Medical History:  Diagnosis Date  . Arthritis   . Blindness of left eye   . Fatigue   . GERD (gastroesophageal reflux disease)   . Glaucoma   . Macular degeneration   . MVP (mitral valve prolapse)   . Palpitations   . SVT (supraventricular tachycardia)     Patient Active Problem List   Diagnosis Date Noted  . Blurred vision 01/27/2014  . Fall at home 01/27/2014  . Syncope 01/25/2014  . Injury of right upper arm 01/25/2014  . Glaucoma   . Palpitations   . Fatigue   . Arthritis   . DOE (dyspnea on exertion)   . SVT (supraventricular tachycardia) (HCC)   . MVP (mitral valve prolapse)   . Blindness of left eye     Past Surgical History:  Procedure Laterality  Date  . CATARAC    . CATARACT EXTRACTION    . PARTIAL HIP ARTHROPLASTY    . RETINAL DETACHMENT SURGERY    . TOTAL HIP ARTHROPLASTY Right 1990     OB History   No obstetric history on file.     Family History  Problem Relation Age of Onset  . Cancer Mother   . Heart disease Father   . Heart attack Brother     Social History   Tobacco Use  . Smoking status: Former Smoker    Quit date: 09/07/1979    Years since quitting: 40.5  . Smokeless tobacco: Never Used  Substance Use Topics  . Alcohol use: Yes    Comment: occaisional wine  . Drug use: No    Home Medications Prior to Admission medications   Medication Sig Start Date End Date Taking? Authorizing Provider  Artificial Tear Solution (BION TEARS OP) Apply 1 drop to eye daily as needed (dry eyes).    [provider]  aspirin 81 MG tablet Take 81 mg by mouth daily.      [provider]  Calcium Carbonate (CALTRATE 600 PO) Take 1 tablet by mouth daily.     [provider]  fish oil-omega-3 fatty acids 1000 MG capsule Take 1 g by mouth daily.     [provider]  Hypromellose (SYSTANE OVERNIGHT  THERAPY) 0.3 % GEL Apply 1 drop to eye at bedtime.    [provider]  metoprolol (TOPROL-XL) 50 MG 24 hr tablet Take 50 mg by mouth daily.      [provider]  Multiple Vitamin (MULTIVITAMIN) tablet Take 1 tablet by mouth daily.     [provider]  Multiple Vitamins-Minerals (ICAPS PO) Take 1 capsule by mouth daily.     [provider]  Red Yeast Rice Extract (RED YEAST RICE PO) Take 1 tablet by mouth daily.     [provider]  TIMOPTIC OCUDOSE 0.5 % SOLN Place 1 drop into the right eye daily.  11/28/13   [provider]    Allergies    Iodinated diagnostic agents and Xalatan  Review of Systems   Review of Systems  Constitutional: Negative for diaphoresis and fever.  Eyes: Negative for redness.  Respiratory: Positive for cough and  shortness of breath.   Cardiovascular: Positive for chest pain. Negative for palpitations and leg swelling.  Gastrointestinal: Positive for diarrhea and nausea. Negative for abdominal pain and vomiting.  Genitourinary: Negative for dysuria.  Musculoskeletal: Negative for back pain and neck pain.  Skin: Negative for rash.  Neurological: Negative for syncope and light-headedness.  Psychiatric/Behavioral: The patient is not nervous/anxious.     Physical Exam Updated Vital Signs There were no vitals taken for this visit.  Physical Exam Vitals and nursing note reviewed.  Constitutional:      Appearance: She is well-developed. She is not diaphoretic.  HENT:     Head: Normocephalic and atraumatic.     Mouth/Throat:     Mouth: Mucous membranes are not dry.  Eyes:     Conjunctiva/sclera: Conjunctivae normal.  Neck:     Vascular: Normal carotid pulses. No carotid bruit or JVD.     Trachea: Trachea normal. No tracheal deviation.  Cardiovascular:     Rate and Rhythm: Tachycardia present. Rhythm irregular.     Pulses: No decreased pulses.     Heart sounds: Normal heart sounds, S1 normal and S2 normal. No murmur heard.   Pulmonary:     Effort: Pulmonary effort is normal. No respiratory distress.     Breath sounds: No wheezing.     Comments: Lungs are clear Chest:     Chest wall: No tenderness.  Abdominal:     General: Bowel sounds are normal.     Palpations: Abdomen is soft.     Tenderness: There is no abdominal tenderness. There is no guarding or rebound.  Musculoskeletal:        General: Normal range of motion.     Cervical back: Normal range of motion and neck supple. No muscular tenderness.     Right lower leg: Edema present.     Left lower leg: Edema present.     Comments: 1+ pre-tibial edema  Skin:    General: Skin is warm and dry.     Coloration: Skin is not pale.  Neurological:     Mental Status: She is alert.     ED Results / Procedures / Treatments   Labs (all  labs ordered are listed, but only abnormal results are displayed) Labs Reviewed  RESPIRATORY PANEL BY RT PCR (FLU A&B, COVID)  CBC WITH DIFFERENTIAL/PLATELET  BASIC METABOLIC PANEL  TSH  BRAIN NATRIURETIC PEPTIDE  LACTIC ACID, PLASMA  LACTIC ACID, PLASMA  MAGNESIUM  TROPONIN I (HIGH SENSITIVITY)    EKG EKG Interpretation  Date/Time:  Thursday March 17 2020 13:50:41 EDT Ventricular  Rate:  116 PR Interval:    QRS Duration: 88 QT Interval:  326 QTC Calculation: 453 R Axis:   22 Text Interpretation: Atrial fibrillation Consider left ventricular hypertrophy when comapred to prior, new Afib with RVR. No STEMI Confirmed by Theda Belfast ([id number]) on 03/17/2020 2:51:18 PM   Radiology DG Chest Port 1 View  Result Date: 03/17/2020 CLINICAL DATA:  Shortness of breath.  Cough. EXAM: PORTABLE CHEST 1 VIEW COMPARISON:  No prior. FINDINGS: Cardiomegaly. No pulmonary venous congestion. Diffuse bilateral interstitial prominence. Interstitial edema and/or pneumonitis could present this fashion. Tiny bilateral pleural effusions can not be excluded. No pneumothorax. Thoracic spine scoliosis. IMPRESSION: 1. Cardiomegaly. No pulmonary venous congestion. 2. Diffuse bilateral interstitial prominence. Interstitial edema and/or pneumonitis could present in this fashion. Electronically Signed   By: Maisie Fus  Register   On: 03/17/2020 14:23    Procedures Procedures (including critical care time)  Medications Ordered in ED Medications  diltiazem (CARDIZEM) 1 mg/mL load via infusion 10 mg (has no administration in time range)    And  diltiazem (CARDIZEM) 125 mg in dextrose 5% 125 mL (1 mg/mL) infusion (has no administration in time range)  metoprolol tartrate (LOPRESSOR) injection 5 mg (5 mg Intravenous Given 03/17/20 1451)    ED Course  I have reviewed the triage vital signs and the nursing notes.  Pertinent labs & imaging results that were available during my care of the patient were reviewed by me  and considered in my medical decision making (see chart for details).  Patient seen and examined. Labs ordered.   Vital signs reviewed and are as follows: BP (!) 139/101   Pulse (!) 129   Temp 97.9 F (36.6 C) (Oral)   Resp (!) 25   SpO2 96%   3:13 PM patient noted to be in atrial fibrillation with RVR.  She was given a dose of IV metoprolol.  Will place on drip.  Chest x-ray is suggestive of heart failure.  Awaiting completion of work-up.  Patient is comfortable and appears well.  Pt updated on results.   Signout to [employer] at shift change.  Plan: Follow-up on results.  She will likely need admission for heart failure, rapid atrial fibrillation evaluation and treatment.  CRITICAL CARE Performed by: Renne Crigler PA-C Total critical care time: 40 minutes Critical care time was exclusive of separately billable procedures and treating other patients. Critical care was necessary to treat or prevent imminent or life-threatening deterioration. Critical care was time spent personally by me on the following activities: development of treatment plan with patient and/or surrogate as well as nursing, discussions with consultants, evaluation of patient's response to treatment, examination of patient, obtaining history from patient or surrogate, ordering and performing treatments and interventions, ordering and review of laboratory studies, ordering and review of radiographic studies, pulse oximetry and re-evaluation of patient's condition.     MDM Rules/Calculators/A&P                          Pending completion of work-up./   Final Clinical Impression(s) / ED Diagnoses Final diagnoses:  Atrial fibrillation with RVR (HCC)  Shortness of breath    Rx / DC Orders ED Discharge Orders    None       Renne Crigler, PA-C 03/17/20 1515    Tegeler, Canary Brim, MD 03/18/20 [phone]

## 2020-03-17 NOTE — Progress Notes (Signed)
Patient arrived to room in NAD, free from pain and VS stable. Patient oriented to room and call bel in reach.

## 2020-03-17 NOTE — ED Triage Notes (Signed)
Pt BIB [facility] from PCP d/t reported HTN of 184/120. She was also tachy in the 120's (per EMS). Upon arrival to ED pt only c/o mid back pain rated at 2/10, & SOB when she is supine or on exertion, She is noted to have bilateral pitting edema in lower extremities.

## 2020-03-18 ENCOUNTER — Inpatient Hospital Stay (HOSPITAL_COMMUNITY): Payer: Medicare PPO

## 2020-03-18 DIAGNOSIS — I4891 Unspecified atrial fibrillation: Principal | ICD-10-CM

## 2020-03-18 DIAGNOSIS — I471 Supraventricular tachycardia: Secondary | ICD-10-CM

## 2020-03-18 DIAGNOSIS — I5031 Acute diastolic (congestive) heart failure: Secondary | ICD-10-CM | POA: Diagnosis not present

## 2020-03-18 LAB — ECHOCARDIOGRAM COMPLETE
AR max vel: 2 cm2
AV Area VTI: 1.64 cm2
AV Area mean vel: 1.7 cm2
AV Mean grad: 6 mmHg
AV Peak grad: 10.1 mmHg
Ao pk vel: 1.59 m/s
Height: 65 in
P 1/2 time: 716 msec
S' Lateral: 2.4 cm
Weight: 2750.4 oz

## 2020-03-18 LAB — BASIC METABOLIC PANEL
Anion gap: 9 (ref 5–15)
BUN: 12 mg/dL (ref 8–23)
CO2: 22 mmol/L (ref 22–32)
Calcium: 8.9 mg/dL (ref 8.9–10.3)
Chloride: 103 mmol/L (ref 98–111)
Creatinine, Ser: 0.63 mg/dL (ref 0.44–1.00)
GFR, Estimated: >60 mL/min (ref >60)
Glucose, Bld: 113 mg/dL — ABNORMAL HIGH (ref 70–99)
Potassium: 3.9 mmol/L (ref 3.5–5.1)
Sodium: 134 mmol/L — ABNORMAL LOW (ref 135–145)

## 2020-03-18 LAB — PHOSPHORUS: Phosphorus: 3.1 mg/dL (ref 2.5–4.6)

## 2020-03-18 LAB — CBC
HCT: 37.4 % (ref 36.0–46.0)
Hemoglobin: 12.5 g/dL (ref 12.0–15.0)
MCH: 30.4 pg (ref 26.0–34.0)
MCHC: 33.4 g/dL (ref 30.0–36.0)
MCV: 91 fL (ref 80.0–100.0)
Platelets: 172 10*3/uL (ref 150–400)
RBC: 4.11 MIL/uL (ref 3.87–5.11)
RDW: 13 % (ref 11.5–15.5)
WBC: 6.2 10*3/uL (ref 4.0–10.5)
nRBC: 0 % (ref 0.0–0.2)

## 2020-03-18 LAB — MAGNESIUM: Magnesium: 2.1 mg/dL (ref 1.7–2.4)

## 2020-03-18 MED ORDER — ENOXAPARIN SODIUM 40 MG/0.4ML ~~LOC~~ SOLN
40.0000 mg | SUBCUTANEOUS | Status: DC
  Administered 2020-03-18: 40 mg via SUBCUTANEOUS
  Filled 2020-03-18: qty 0.4

## 2020-03-18 MED ORDER — APIXABAN 5 MG PO TABS
5.0000 mg | ORAL_TABLET | Freq: Two times a day (BID) | ORAL | Status: DC
  Administered 2020-03-19 – 2020-03-20 (×3): 5 mg via ORAL
  Filled 2020-03-18 (×3): qty 1

## 2020-03-18 MED ORDER — METOPROLOL SUCCINATE ER 50 MG PO TB24
50.0000 mg | ORAL_TABLET | Freq: Every day | ORAL | Status: DC
  Administered 2020-03-18 – 2020-03-20 (×3): 50 mg via ORAL
  Filled 2020-03-18 (×3): qty 1

## 2020-03-18 MED ORDER — DILTIAZEM HCL 60 MG PO TABS
60.0000 mg | ORAL_TABLET | Freq: Four times a day (QID) | ORAL | Status: DC
  Administered 2020-03-18 – 2020-03-19 (×3): 60 mg via ORAL
  Filled 2020-03-18 (×3): qty 1

## 2020-03-18 NOTE — Progress Notes (Signed)
ANTICOAGULATION CONSULT NOTE - Initial Consult  Pharmacy Consult for apixaban Indication: atrial fibrillation  Allergies  Allergen Reactions  . Iodinated Diagnostic Agents Nausea And Vomiting  . Xalatan     Vision problems    Patient Measurements: Height: 5\' 5"  (165.1 cm) Weight: 78 kg (171 lb 14.4 oz) IBW/kg (Calculated) : 57   Vital Signs: Temp: 97.6 F (36.4 C) (10/29 1348) Temp Source: Oral (10/29 1348) BP: 112/73 (10/29 1348) Pulse Rate: 100 (10/29 1348)  Labs: Recent Labs    03/17/20 1444 03/17/20 1545 03/18/20 0206  HGB 13.2  --  12.5  HCT 40.0  --  37.4  PLT 190  --  172  CREATININE 0.67  --  0.63  TROPONINIHS 12 14  --     Estimated Creatinine Clearance: 48.3 mL/min (by C-G formula based on SCr of 0.63 mg/dL).   Medical History: Past Medical History:  Diagnosis Date  . Arthritis   . Blindness of left eye   . Fatigue   . GERD (gastroesophageal reflux disease)   . Glaucoma   . Macular degeneration   . MVP (mitral valve prolapse)   . Palpitations   . SVT (supraventricular tachycardia) [facility])    Assessment: 86 yo W with new onset afib RVR. Pharmacy is consulted for apixaban.  Confirmed with RN, received enoxaparin 40mg  dose at 13:30 today. ClCr ~48 ml/min, Scr 0.6, 78kg. Start apixaban 5mg  tomorrow.    Goal of Therapy:  Monitor platelets by anticoagulation protocol: Yes   Plan:  Start apixaban 5mg  BID on 10/30 Stop enoxaparin Monitor for signs/symptoms of bleeding   , PharmD, BCPS, BCCP Clinical Pharmacist  Please check AMION for all [facility] Pharmacy phone numbers After 10:00 PM, call Main Pharmacy [phone]

## 2020-03-18 NOTE — Evaluation (Signed)
Physical Therapy Evaluation Patient Details Name: Regina Taylor MRN: [id number] DOB: 10-Nov-1927 Today's Date: 03/18/2020   History of Present Illness  Pt presented with SOB and found to be in afib with RVR. PMH - arthritis, SVT, blind lt eye, rt thr.   Clinical Impression  Pt presents to PT with slightly unsteady gait due to illness and inactivity. Expect pt will make good progress back to baseline with mobility. Will follow acutely but doubt pt will need PT after DC.      Follow Up Recommendations No PT follow up;Supervision - Intermittent    Equipment Recommendations  Other (comment) (possibly rollator)    Recommendations for Other Services       Precautions / Restrictions Precautions Precautions: Other (comment) Precaution Comments: watch HR Restrictions Weight Bearing Restrictions: No      Mobility  Bed Mobility Overal bed mobility: Modified Independent                  Transfers Overall transfer level: Needs assistance Equipment used: None Transfers: Sit to/from Stand Sit to Stand: Supervision         General transfer comment: for safety and lines  Ambulation/Gait Ambulation/Gait assistance: Supervision Gait Distance (Feet): 150 Feet Assistive device: None;4-wheeled walker Gait Pattern/deviations: Step-through pattern;Decreased stride length Gait velocity: decr Gait velocity interpretation: 1.31 - 2.62 ft/sec, indicative of limited community ambulator General Gait Details: Pt feels slightly unsteady but no overt loss of balance. Used rollator in hallway and felt more stable  Stairs            Wheelchair Mobility    Modified Rankin (Stroke Patients Only)       Balance Overall balance assessment: Mild deficits observed, not formally tested                                           Pertinent Vitals/Pain Pain Assessment: No/denies pain    Home Living Family/patient expects to be discharged to:: Private  residence Living Arrangements: Alone Available Help at Discharge: Family;Available PRN/intermittently Type of Home: House Home Access: Stairs to enter Entrance Stairs-Rails: Right Entrance Stairs-Number of Steps: 5 Home Layout: Two level;Able to live on main level with bedroom/bathroom Home Equipment: None      Prior Function Level of Independence: Needs assistance   Gait / Transfers Assistance Needed: Independent without assistive device  ADL's / Homemaking Assistance Needed: Cleaner comes in and she gets a ride to grocery store  Comments: Doesn't drive     Hand Dominance        Extremity/Trunk Assessment   Upper Extremity Assessment Upper Extremity Assessment: Generalized weakness    Lower Extremity Assessment Lower Extremity Assessment: Generalized weakness       Communication   Communication: No difficulties  Cognition Arousal/Alertness: Awake/alert Behavior During Therapy: WFL for tasks assessed/performed Overall Cognitive Status: Within Functional Limits for tasks assessed                                        General Comments General comments (skin integrity, edema, etc.): RHR 89. HR with amb 112. SpO2 95% on RA    Exercises     Assessment/Plan    PT Assessment Patient needs continued PT services  PT Problem List Decreased strength;Decreased activity tolerance;Decreased balance;Decreased mobility  PT Treatment Interventions DME instruction;Gait training;Functional mobility training;Therapeutic activities;Therapeutic exercise;Patient/family education;Balance training    PT Goals (Current goals can be found in the Care Plan section)  Acute Rehab PT Goals Patient Stated Goal: return home PT Goal Formulation: With patient Time For Goal Achievement: 03/25/20 Potential to Achieve Goals: Good    Frequency Min 3X/week   Barriers to discharge Decreased caregiver support Lives alone    Co-evaluation                AM-PAC PT "6 Clicks" Mobility  Outcome Measure Help needed turning from your back to your side while in a flat bed without using bedrails?: None Help needed moving from lying on your back to sitting on the side of a flat bed without using bedrails?: None Help needed moving to and from a bed to a chair (including a wheelchair)?: A Little Help needed standing up from a chair using your arms (e.g., wheelchair or bedside chair)?: A Little Help needed to walk in hospital room?: A Little Help needed climbing 3-5 steps with a railing? : A Little 6 Click Score: 20    End of Session   Activity Tolerance: Patient tolerated treatment well Patient left: in chair;with call bell/phone within reach Nurse Communication: Mobility status PT Visit Diagnosis: Unsteadiness on feet (R26.81);Muscle weakness (generalized) (M62.81)    Time: 6720-9470 PT Time Calculation (min) (ACUTE ONLY): 26 min   Charges:   PT Evaluation $PT Eval Moderate Complexity: 1 Mod PT Treatments $Gait Training: 8-22 mins        [facility] PT Acute Rehabilitation Services Pager [phone] Office [phone]   Angelina Ok [facility] 03/18/2020, 11:52 AM

## 2020-03-18 NOTE — Progress Notes (Signed)
PROGRESS NOTE    Regina Taylor  EVO:[id number] DOB: [birth year] DOA: 03/17/2020 PCP: Merri Brunette, MD   Brief Narrative:  Patient is 86 year old female with past medical history of palpitations and SVT, GERD, glaucoma presents to emergency department for shortness of breath and orthopnea since few days.  She went to see her PCP and was found to have A. fib with elevated heart rate in 120s therefore she sent to ER for further evaluation and management.  Reports chronic nonbloody diarrhea since summer.  ED course: Patient received metoprolol and started on Cardizem drip for new onset A. fib with RVR.  Magnesium: Very low.  BNP: 417, initial troponin: 12, TSH: WNL, chest x-ray showed cardiomegaly with diffuse bilateral interstitial prominence.  Magnesium was replenished in ED.  Patient admitted for further evaluation and management of new onset A. fib with RVR.  Assessment & Plan:   New onset A. fib with RVR: -Patient presented with shortness of breath and generalized weakness.  EKG showed A. fib.  Afebrile with no leukocytosis. -Troponin: Negative.  TSH: WNL, echo is pending -Initial magnesium: Very low.  Replenished.  Repeat magnesium: WNL -On IV Cardizem-we will switch to p.o. Cardizem -We will consult cardiology for new onset A. Fib -CHA2DS2-VASc score: 64 (age and sex) -We will discuss anticoagulation with cardiology  New onset CHF: Unknown type: -Patient presented with shortness of breath and orthopnea.  BNP elevated at 417.  Reviewed chest x-ray. -Echo is pending.  TSH: WNL -Strict INO's and daily weight.  Monitor electrolytes.  SVT/palpitation: Hold metoprolol for now as patient's blood pressure is on lower side and she is on Cardizem  Chronic diarrhea: Unknown etiology -TSH is WNL.  Less likely infectious.  Patient is afebrile with no leukocytosis.  No abdominal tenderness.  No vomiting -Encourage increased p.o. intake -As needed Imodium  Hypomagnesemia: Replenished.   Repeat magnesium level: WNL.  Glaucoma: Continue eyedrops  DVT prophylaxis: Lovenox/SCD Code Status: Full code Family Communication: Patient's son present at bedside.  Plan of care discussed with patient in length and he verbalized understanding and agreed with it. Disposition Plan: Home in 1 to 2 days  Consultants:   Cardiology  Procedures:   Echo  Antimicrobials:   None  Status is: Inpatient   Dispo: The patient is from: Home              Anticipated d/c is to: Home              Anticipated d/c date is: 03/19/2020              Patient currently not medically stable for discharge         Subjective: Patient seen and examined.  Sitting comfortably on the bed.  Tells me that she her shortness of breath has improved however not resolved completely.  Tells me that she still feels weak however denies chest pain, palpitation, fever, chills, nausea, vomiting, abdominal pain.  Objective: Vitals:   03/18/20 0048 03/18/20 0226 03/18/20 0434 03/18/20 0814  BP: 102/64 116/64    Pulse:   100   Resp:   20   Temp:   (!) 97.5 F (36.4 C)   TempSrc:   Oral   SpO2:   97%   Weight:   78 kg   Height:    5\' 5"  (1.651 m)    Intake/Output Summary (Last 24 hours) at 03/18/2020 1416 Last data filed at 03/18/2020 1416 Gross per 24 hour  Intake 441.98 ml  Output 400  ml  Net 41.98 ml   Filed Weights   03/18/20 0434  Weight: 78 kg    Examination:  General exam: Appears calm and comfortable  Respiratory system: Clear to auscultation. Respiratory effort normal. Cardiovascular system: S1 & S2 heard, RRR. No JVD, murmurs, rubs, gallops or clicks. No pedal edema. Gastrointestinal system: Abdomen is nondistended, soft and nontender. No organomegaly or masses felt. Normal bowel sounds heard. Central nervous system: Alert and oriented. No focal neurological deficits. Extremities: Symmetric 5 x 5 power. Skin: No rashes, lesions or ulcers Psychiatry: Judgement and insight appear  normal. Mood & affect appropriate.    Data Reviewed: I have personally reviewed following labs and imaging studies  CBC: Recent Labs  Lab 03/17/20 1444 03/18/20 0206  WBC 5.4 6.2  NEUTROABS 3.4  --   HGB 13.2 12.5  HCT 40.0 37.4  MCV 93.2 91.0  PLT 190 172   Basic Metabolic Panel: Recent Labs  Lab 03/17/20 1444 03/18/20 0206  NA 136 134*  K 4.3 3.9  CL 103 103  CO2 24 22  GLUCOSE 113* 113*  BUN 10 12  CREATININE 0.67 0.63  CALCIUM 9.3 8.9  MG <0.1* 2.1  PHOS  --  3.1   GFR: Estimated Creatinine Clearance: 48.3 mL/min (by C-G formula based on SCr of 0.63 mg/dL). Liver Function Tests: No results for input(s): AST, ALT, ALKPHOS, BILITOT, PROT, ALBUMIN in the last 168 hours. No results for input(s): LIPASE, AMYLASE in the last 168 hours. No results for input(s): AMMONIA in the last 168 hours. Coagulation Profile: No results for input(s): INR, PROTIME in the last 168 hours. Cardiac Enzymes: No results for input(s): CKTOTAL, CKMB, CKMBINDEX, TROPONINI in the last 168 hours. BNP (last 3 results) No results for input(s): PROBNP in the last 8760 hours. HbA1C: No results for input(s): HGBA1C in the last 72 hours. CBG: No results for input(s): GLUCAP in the last 168 hours. Lipid Profile: No results for input(s): CHOL, HDL, LDLCALC, TRIG, CHOLHDL, LDLDIRECT in the last 72 hours. Thyroid Function Tests: Recent Labs    03/17/20 1420 03/17/20 1744  TSH 1.341  --   FREET4  --  1.21*   Anemia Panel: No results for input(s): VITAMINB12, FOLATE, FERRITIN, TIBC, IRON, RETICCTPCT in the last 72 hours. Sepsis Labs: Recent Labs  Lab 03/17/20 1442 03/17/20 1559  LATICACIDVEN 1.5 1.4    Recent Results (from the past 240 hour(s))  Respiratory Panel by RT PCR (Flu A&B, Covid) - Nasopharyngeal Swab     Status: None   Collection Time: 03/17/20  2:41 PM   Specimen: Nasopharyngeal Swab  Result Value Ref Range Status   SARS Coronavirus 2 by RT PCR NEGATIVE NEGATIVE Final     Comment: (NOTE) SARS-CoV-2 target nucleic acids are NOT DETECTED.  The SARS-CoV-2 RNA is generally detectable in upper respiratoy specimens during the acute phase of infection. The lowest concentration of SARS-CoV-2 viral copies this assay can detect is 131 copies/mL. A negative result does not preclude SARS-Cov-2 infection and should not be used as the sole basis for treatment or other patient management decisions. A negative result may occur with  improper specimen collection/handling, submission of specimen other than nasopharyngeal swab, presence of viral mutation(s) within the areas targeted by this assay, and inadequate number of viral copies (<131 copies/mL). A negative result must be combined with clinical observations, patient history, and epidemiological information. The expected result is Negative.  Fact Sheet for Patients:  https://www.moore.com/  Fact Sheet for Healthcare Providers:  https://www.young.biz/  This test is no t yet approved or cleared by the [place] and  has been authorized for detection and/or diagnosis of SARS-CoV-2 by FDA under an Emergency Use Authorization (EUA). This EUA will remain  in effect (meaning this test can be used) for the duration of the COVID-19 declaration under Section 564(b)(1) of the Act, 21 U.S.C. section 360bbb-3(b)(1), unless the authorization is terminated or revoked sooner.     Influenza A by PCR NEGATIVE NEGATIVE Final   Influenza B by PCR NEGATIVE NEGATIVE Final    Comment: (NOTE) The Xpert Xpress SARS-CoV-2/FLU/RSV assay is intended as an aid in  the diagnosis of influenza from Nasopharyngeal swab specimens and  should not be used as a sole basis for treatment. Nasal washings and  aspirates are unacceptable for Xpert Xpress SARS-CoV-2/FLU/RSV  testing.  Fact Sheet for Patients: https://www.moore.com/  Fact Sheet for Healthcare  Providers: https://www.young.biz/  This test is not yet approved or cleared by the [place] FDA and  has been authorized for detection and/or diagnosis of SARS-CoV-2 by  FDA under an Emergency Use Authorization (EUA). This EUA will remain  in effect (meaning this test can be used) for the duration of the  Covid-19 declaration under Section 564(b)(1) of the Act, 21  U.S.C. section 360bbb-3(b)(1), unless the authorization is  terminated or revoked. Performed at [facility], [address], [place], [place] [address]       Radiology Studies: DG Chest Port 1 View  Result Date: 03/17/2020 CLINICAL DATA:  Shortness of breath.  Cough. EXAM: PORTABLE CHEST 1 VIEW COMPARISON:  No prior. FINDINGS: Cardiomegaly. No pulmonary venous congestion. Diffuse bilateral interstitial prominence. Interstitial edema and/or pneumonitis could present this fashion. Tiny bilateral pleural effusions can not be excluded. No pneumothorax. Thoracic spine scoliosis. IMPRESSION: 1. Cardiomegaly. No pulmonary venous congestion. 2. Diffuse bilateral interstitial prominence. Interstitial edema and/or pneumonitis could present in this fashion. Electronically Signed   By: Maisie Fus  Register   On: 03/17/2020 14:23    Scheduled Meds: . aspirin  81 mg Oral Daily  . diltiazem  60 mg Oral Q6H   Continuous Infusions: . diltiazem (CARDIZEM) infusion 15 mg/hr (03/18/20 0702)     LOS: 1 day   Time spent: 35 minutes   Malacki Mcphearson Estill Cotta, MD [facility]  If 7PM-7AM, please contact night-coverage [email or web address] 03/18/2020, 2:16 PM

## 2020-03-18 NOTE — Consult Note (Addendum)
Cardiology Consultation:   Patient ID: Regina Taylor MRN: [id number]; DOB: Jan 02, 1928  Admit date: 03/17/2020 Date of Consult: 03/18/2020  Primary Care Provider: Merri Brunette, MD [facility] Cardiologist: Peter Swaziland, MD  [facility] Electrophysiologist:  None    Patient Profile:   Regina Taylor is a 86 y.o. 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 Jacqulyn Bath.  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  Patient was last seen by Dr. Swaziland on 09/11/2010 prior to her trip to [place], she was doing well at the time.  She has been lost to follow-up since.  Patient was admitted for fall in September 2015.  Echocardiogram obtained on 01/26/2014 showed EF 65 to 70%, grade 1 DD, trivial AI, mild mitral prolapse involving mid scallop of the posterior leaflet, PA peak pressure 38 mmHg.  Patient has been in her usual state of health until the past few months.  She is having more diarrhea.  For the past month or so, she has been noticing increasing dyspnea on exertion and shortness of breath.  This has also progressed to orthopnea in the past week prompting the patient to seek medical attention at her PCPs office.  She was noted to be in new onset of atrial fibrillation with RVR and was sent to [facility].  Initial magnesium level was very low.  BNP was 417.  Lactic acid was negative.  Hemoglobin normal.  TSH was normal, free T4 borderline elevated.  Chest x-ray showed diffuse bilateral interstitial prominence concerning for interstitial edema.  She was treated with a single dose of 2 g of magnesium sulfate.  Subsequent lab work showed a normalization of  magnesium level.  Patient was also started on diltiazem drip, this has been converted to oral short acting diltiazem.  Echocardiogram showed normal EF 60 to 65%, severe LAE, elevated RV pressure.  Cardiology has been consulted for atrial fibrillation with RVR.   Past Medical History:  Diagnosis Date  . Arthritis   . Blindness of left eye   . Fatigue   . GERD (gastroesophageal reflux disease)   . Glaucoma   . Macular degeneration   . MVP (mitral valve prolapse)   . Palpitations   . SVT (supraventricular tachycardia) (HCC)     Past Surgical History:  Procedure Laterality Date  . CATARAC    . CATARACT EXTRACTION    . PARTIAL HIP ARTHROPLASTY    . RETINAL DETACHMENT SURGERY    . TOTAL HIP ARTHROPLASTY Right 1990     Home Medications:  Prior to Admission medications   Medication Sig Start Date End Date Taking? Authorizing Provider  Artificial Tear Solution (BION TEARS OP) Apply 1 drop to eye daily as needed (dry eyes).   Yes [provider]  fish oil-omega-3 fatty acids 1000 MG capsule Take 1 g by mouth daily.    Yes [provider]  Hypromellose (SYSTANE OVERNIGHT THERAPY) 0.3 % GEL Apply 1 drop to eye at bedtime.   Yes [provider]  metoprolol (TOPROL-XL) 50 MG 24 hr tablet Take 50 mg by mouth every evening.    Yes [provider]  Multiple Vitamin (MULTIVITAMIN) tablet Take 1 tablet by mouth  daily.    Yes [provider]  Multiple Vitamins-Minerals (ICAPS PO) Take 1 capsule by mouth daily.    Yes [provider]  Red Yeast Rice Extract (RED YEAST RICE PO) Take 1 tablet by mouth daily.    Yes [provider]  TIMOPTIC OCUDOSE 0.5 % SOLN Place 1 drop into the right eye daily.  11/28/13  Yes [provider]    Inpatient Medications: Scheduled Meds: . aspirin  81 mg Oral Daily  . diltiazem  60 mg Oral Q6H  . enoxaparin (LOVENOX) injection  40 mg Subcutaneous Q24H  . metoprolol succinate  50 mg Oral Daily    Continuous Infusions: . diltiazem (CARDIZEM) infusion 15 mg/hr (03/18/20 1439)   PRN Meds: acetaminophen, ondansetron (ZOFRAN) IV  Allergies:    Allergies  Allergen Reactions  . Iodinated Diagnostic Agents Nausea And Vomiting  . Xalatan     Vision problems    Social History:   Social History   Socioeconomic History  . Marital status: Divorced    Spouse name: Not on file  . Number of children: 1  . Years of education: Not on file  . Highest education level: Not on file  Occupational History  . Occupation: foreign language professor    Employer: RETIRED    Comment: retired  Tobacco Use  . Smoking status: Former Smoker    Quit date: 09/07/1979    Years since quitting: 40.5  . Smokeless tobacco: Never Used  Substance and Sexual Activity  . Alcohol use: Yes    Comment: occaisional wine  . Drug use: No  . Sexual activity: Not on file  Other Topics Concern  . Not on file  Social History Narrative  . Not on file   Social Determinants of Health   Financial Resource Strain:   . Difficulty of Paying Living Expenses: Not on file  Food Insecurity:   . Worried About Programme researcher, broadcasting/film/video in the Last Year: Not on file  . Ran Out of Food in the Last Year: Not on file  Transportation Needs:   . Lack of Transportation (Medical): Not on file  . Lack of Transportation (Non-Medical): Not on file  Physical Activity:   . Days of Exercise per Week: Not on file  . Minutes of Exercise per Session: Not on file  Stress:   . Feeling of Stress : Not on file  Social Connections:   . Frequency of Communication with Friends and Family: Not on file  . Frequency of Social Gatherings with Friends and Family: Not on file  . Attends Religious Services: Not on file  . Active Member of Clubs or Organizations: Not on file  . Attends Banker Meetings: Not on file  . Marital Status: Not on file  Intimate Partner Violence:   . Fear of Current or Ex-Partner: Not on file  .  Emotionally Abused: Not on file  . Physically Abused: Not on file  . Sexually Abused: Not on file    Family History:    Family History  Problem Relation Age of Onset  . Cancer Mother   . Heart disease Father   . Heart attack Brother      ROS:  Please see the history of present illness.   All other ROS reviewed and negative.     Physical Exam/Data:   Vitals:   03/18/20 0226 03/18/20 0434 03/18/20 0814 03/18/20 1348  BP: 116/64   112/73  Pulse:  100  100  Resp:  20  16  Temp:  (!) 97.5 F (36.4 C)  97.6 F (36.4 C)  TempSrc:  Oral  Oral  SpO2:  97%  96%  Weight:  78 kg    Height:   5\' 5"  (1.651 m)     Intake/Output Summary (Last 24 hours) at 03/18/2020 1618 Last data filed at 03/18/2020 1500 Gross per 24 hour  Intake 575.81 ml  Output 400 ml  Net 175.81 ml   Last 3 Weights 03/18/2020 01/26/2014 01/25/2014  Weight (lbs) 171 lb 14.4 oz 174 lb 174 lb 14.4 oz  Weight (kg) 77.973 kg 78.926 kg 79.334 kg     Body mass index is 28.61 kg/m.  General:  Well nourished, well developed, in no acute distress HEENT: normal Lymph: no adenopathy Neck: no JVD Endocrine:  No thryomegaly Vascular: No carotid bruits; FA pulses 2+ bilaterally without bruits  Cardiac:  normal S1, S2; irregularly irregular; no murmur  Lungs:  clear to auscultation bilaterally, no wheezing, rhonchi or rales  Abd: soft, nontender, no hepatomegaly  Ext: no edema Musculoskeletal:  No deformities, BUE and BLE strength normal and equal Skin: warm and dry  Neuro:  CNs 2-12 intact, no focal abnormalities noted Psych:  Normal affect   EKG:  The EKG was personally reviewed and demonstrates:  Atrial fibrillation with RVR Telemetry:  Telemetry was personally reviewed and demonstrates:  Atrial fibrillation, no significant ventricular ectopy  Relevant CV Studies:  Echo 03/18/2020 1. Left ventricular ejection fraction, by estimation, is 60 to 65%. The  left ventricle has normal function. The left ventricle  has no regional  wall motion abnormalities. There is moderate concentric left ventricular  hypertrophy. Left ventricular  diastolic function could not be evaluated.  2. Right ventricular systolic function is normal. The right ventricular  size is normal. There is moderately elevated pulmonary artery systolic  pressure. The estimated right ventricular systolic pressure is 45.2 mmHg.  3. Left atrial size was severely dilated.  4. Right atrial size was mildly dilated.  5. The mitral valve is degenerative. Trivial mitral valve regurgitation.  No evidence of mitral stenosis.  6. The aortic valve is tricuspid. There is mild calcification of the  aortic valve. There is mild thickening of the aortic valve. Aortic valve  regurgitation is mild. Mild aortic valve sclerosis is present, with no  evidence of aortic valve stenosis.  7. The inferior vena cava is dilated in size with <50% respiratory  variability, suggesting right atrial pressure of 15 mmHg.   Laboratory Data:  High Sensitivity Troponin:   Recent Labs  Lab 03/17/20 1444 03/17/20 1545  TROPONINIHS 12 14     Chemistry Recent Labs  Lab 03/17/20 1444 03/18/20 0206  NA 136 134*  K 4.3 3.9  CL 103 103  CO2 24 22  GLUCOSE 113* 113*  BUN 10 12  CREATININE 0.67 0.63  CALCIUM 9.3 8.9  GFRNONAA >60 >60  ANIONGAP 9 9    No results for input(s): PROT, ALBUMIN, AST, ALT, ALKPHOS, BILITOT in the last 168 hours. Hematology Recent Labs  Lab 03/17/20 1444 03/18/20 0206  WBC 5.4 6.2  RBC 4.29 4.11  HGB 13.2 12.5  HCT 40.0 37.4  MCV 93.2 91.0  MCH 30.8 30.4  MCHC 33.0 33.4  RDW 13.1 13.0  PLT 190 172   BNP Recent Labs  Lab 03/17/20 1444  BNP 417.4*    DDimer No results for input(s): DDIMER in the last 168 hours.   Radiology/Studies:  [facility] Chest Port [address]  Result Date: 03/17/2020 CLINICAL DATA:  Shortness of breath.  Cough. EXAM: PORTABLE CHEST 1 VIEW COMPARISON:  No prior. FINDINGS: Cardiomegaly. No pulmonary  venous congestion. Diffuse bilateral interstitial prominence. Interstitial edema and/or pneumonitis could present this fashion. Tiny bilateral pleural effusions can not be excluded. No pneumothorax. Thoracic spine scoliosis. IMPRESSION: 1. Cardiomegaly. No pulmonary venous congestion. 2. Diffuse bilateral interstitial prominence. Interstitial edema and/or pneumonitis could present in this fashion. Electronically Signed   By: Maisie Fus  Register   On: 03/17/2020 14:23   ECHOCARDIOGRAM COMPLETE  Result Date: 03/18/2020    ECHOCARDIOGRAM REPORT   Patient Name:   Regina Taylor Date of Exam: 03/18/2020 Medical Rec #:  [id number]       Height:       65.0 in Accession #:    [id number]      Weight:       171.9 lb Date of Birth:  Oct 08, 1927        BSA:          1.855 m Patient Age:    89 years        BP:           116/64 mmHg Patient Gender: F               HR:           100 bpm. Exam Location:  Inpatient Procedure: 2D Echo, Cardiac Doppler and Color Doppler Indications:     Congestive Heart Failure  History:         Patient has prior history of Echocardiogram examinations, most                  recent 01/26/2014. Arrythmias:Atrial Fibrillation; Risk                  Factors:Former Smoker. MVP. DOE.  Sonographer:     Ross Ludwig RDCS (AE) Referring Phys:  [id number] Gertha Calkin Diagnosing Phys: Lennie Odor MD IMPRESSIONS  1. Left ventricular ejection fraction, by estimation, is 60 to 65%. The left ventricle has normal function. The left ventricle has no regional wall motion abnormalities. There is moderate concentric left ventricular hypertrophy. Left ventricular diastolic function could not be evaluated.  2. Right ventricular systolic function is normal. The right ventricular size is normal. There is moderately elevated pulmonary artery systolic pressure. The estimated right ventricular systolic pressure is 45.2 mmHg.  3. Left atrial size was severely dilated.  4. Right atrial size was mildly dilated.  5. The mitral valve is  degenerative. Trivial mitral valve regurgitation. No evidence of mitral stenosis.  6. The aortic valve is tricuspid. There is mild calcification of the aortic valve. There is mild thickening of the aortic valve. Aortic valve regurgitation is mild. Mild aortic valve sclerosis is present, with no evidence of aortic valve stenosis.  7. The inferior vena cava is dilated in size with <50% respiratory variability, suggesting right atrial pressure of 15 mmHg. Comparison(s): Changes from prior study are noted. In Afib with RVR. EF remains the same. RVSP ~45 mmHG on this study. FINDINGS  Left Ventricle: Left ventricular ejection fraction, by estimation, is 60 to 65%. The left ventricle has normal function. The left ventricle has no regional wall motion abnormalities. The left ventricular internal cavity size was normal in size. There is  moderate concentric left ventricular hypertrophy. Left ventricular diastolic function could not be evaluated due to atrial fibrillation. Left ventricular diastolic function could not be evaluated. Right Ventricle: The right ventricular size is  normal. No increase in right ventricular wall thickness. Right ventricular systolic function is normal. There is moderately elevated pulmonary artery systolic pressure. The tricuspid regurgitant velocity is 2.75 m/s, and with an assumed right atrial pressure of 15 mmHg, the estimated right ventricular systolic pressure is 45.2 mmHg. Left Atrium: Left atrial size was severely dilated. Right Atrium: Right atrial size was mildly dilated. Pericardium: Trivial pericardial effusion is present. Presence of pericardial fat pad. Mitral Valve: The mitral valve is degenerative in appearance. Mild to moderate mitral annular calcification. Trivial mitral valve regurgitation. No evidence of mitral valve stenosis. Tricuspid Valve: The tricuspid valve is grossly normal. Tricuspid valve regurgitation is mild . No evidence of tricuspid stenosis. Aortic Valve: The aortic  valve is tricuspid. There is mild calcification of the aortic valve. There is mild thickening of the aortic valve. Aortic valve regurgitation is mild. Aortic regurgitation PHT measures 716 msec. Mild aortic valve sclerosis is present, with no evidence of aortic valve stenosis. Aortic valve mean gradient measures 6.0 mmHg. Aortic valve peak gradient measures 10.1 mmHg. Aortic valve area, by VTI measures 1.64 cm. Pulmonic Valve: The pulmonic valve was grossly normal. Pulmonic valve regurgitation is not visualized. No evidence of pulmonic stenosis. Aorta: The aortic root is normal in size and structure. Venous: The inferior vena cava is dilated in size with less than 50% respiratory variability, suggesting right atrial pressure of 15 mmHg. IAS/Shunts: The atrial septum is grossly normal. Additional Comments: There is a small pleural effusion in the left lateral region.  LEFT VENTRICLE PLAX 2D LVIDd:         3.80 cm LVIDs:         2.40 cm LV PW:         1.48 cm LV IVS:        1.50 cm LVOT diam:     1.90 cm LV SV:         55 LV SV Index:   30 LVOT Area:     2.84 cm  RIGHT VENTRICLE            IVC RV Basal diam:  3.20 cm    IVC diam: 2.40 cm RV S prime:     9.45 cm/s TAPSE (M-mode): 2.3 cm LEFT ATRIUM              Index       RIGHT ATRIUM           Index LA diam:        4.00 cm  2.16 cm/m  RA Area:     21.30 cm LA Vol (A2C):   103.0 ml 55.53 ml/m RA Volume:   62.00 ml  33.43 ml/m LA Vol (A4C):   96.3 ml  51.92 ml/m LA Biplane Vol: 100.0 ml 53.91 ml/m  AORTIC VALVE AV Area (Vmax):    2.00 cm AV Area (Vmean):   1.70 cm AV Area (VTI):     1.64 cm AV Vmax:           159.00 cm/s AV Vmean:          116.000 cm/s AV VTI:            0.334 m AV Peak Grad:      10.1 mmHg AV Mean Grad:      6.0 mmHg LVOT Vmax:         112.00 cm/s LVOT Vmean:        69.500 cm/s LVOT VTI:  0.193 m LVOT/AV VTI ratio: 0.58 AI PHT:            716 msec  AORTA Ao Root diam: 3.20 cm TRICUSPID VALVE TR Peak grad:   30.2 mmHg TR Vmax:         275.00 cm/s  SHUNTS Systemic VTI:  0.19 m Systemic Diam: 1.90 cm Lennie Odor MD Electronically signed by Lennie Odor MD Signature Date/Time: 03/18/2020/2:39:14 PM    Final (Updated)      Assessment and Plan:   1. Newly diagnosed atrial fibrillation with RVR  -Echocardiogram showed normal EF, severely dilated left atrium  -CHA2DS2-Vasc score 4 (age, female, CHF)  -no contraindication of NOAC, stop aspirin and Lovenox (last dose 3:26PM), start Eliquis tomorrow afternoon at 5mg  BID, will enlist the help of our pharmacist to help with timing.   - started on short acting diltiazem, plan to consolidate to long acting tomorrow. Current 60mg  QID of short acting diltiazem equates to the 240mg  daily of long acting diltiazem.  Restart home toprol XL.   - if her DOE and fatigue resolve with better rate control, will likely leave her in afib and pursue rate control only. However if she is still symptomatic with good rate control, we may consider DCCV as outpatient, unfortunately her chance of maintain sinus rhythm is fairly low with severely dilated left atrium.  2. Acute diastolic heart failure related to rapid A. Fib  - although BNP and CXR suggested volume overload on arrival, with better rate control, volume overload has improved. Only trace ankle edema, lung is now clear  3. History of SVT  - previously on 50mg  daily metoprolol succinate, will restart on top of diltiazem to give better rate control  4. Left eye blindness: History of retinal detachment  5. Abnormal thyroid level: normal TSH but borderline elevated free T4, will attach free T3 to the previous lab        New York Heart Association (NYHA) Functional Class NYHA Class II   CHA2DS2-VASc Score = 4  This indicates a 4.8% annual risk of stroke. The patient's score is based upon: CHF History: 1 HTN History: 0 Diabetes History: 0 Stroke History: 0 Vascular Disease History: 0 Age Score: 2 Gender Score: 1         For  questions or updates, please contact [facility] Please consult [email or web address] for contact info under    ,  03/18/2020 4:18 PM

## 2020-03-18 NOTE — Progress Notes (Signed)
Physical Therapy Treatment Patient Details Name: Regina Taylor MRN: [id number] DOB: Oct 17, 1927 Today's Date: 03/18/2020    History of Present Illness Pt presented with SOB and found to be in afib with RVR. PMH - arthritis, SVT, blind lt eye, rt thr.     PT Comments    Pt seen for second session to incr activity and work toward return home alone.    Follow Up Recommendations  No PT follow up;Supervision - Intermittent     Equipment Recommendations  Other (comment) (possibly rollator)    Recommendations for Other Services       Precautions / Restrictions Precautions Precautions: Other (comment) Precaution Comments: watch HR    Mobility  Bed Mobility               General bed mobility comments: Up in chair  Transfers Overall transfer level: Needs assistance Equipment used: None Transfers: Sit to/from Stand Sit to Stand: Supervision         General transfer comment: for safety and lines  Ambulation/Gait Ambulation/Gait assistance: Supervision Gait Distance (Feet): 250 Feet Assistive device: None Gait Pattern/deviations: Step-through pattern;Decreased stride length Gait velocity: decr Gait velocity interpretation: 1.31 - 2.62 ft/sec, indicative of limited community ambulator General Gait Details: Slightly hestitant due to feeling slightly unsteady but no loss of balance   Stairs             Wheelchair Mobility    Modified Rankin (Stroke Patients Only)       Balance Overall balance assessment: Mild deficits observed, not formally tested                                          Cognition Arousal/Alertness: Awake/alert Behavior During Therapy: WFL for tasks assessed/performed Overall Cognitive Status: Within Functional Limits for tasks assessed                                        Exercises      General Comments General comments (skin integrity, edema, etc.): RHR 104. HR with amb 120       Pertinent Vitals/Pain Pain Assessment: No/denies pain    Home Living                      Prior Function            PT Goals (current goals can now be found in the care plan section) Acute Rehab PT Goals Patient Stated Goal: return home    Frequency    Min 3X/week      PT Plan Current plan remains appropriate    Co-evaluation              AM-PAC PT "6 Clicks" Mobility   Outcome Measure  Help needed turning from your back to your side while in a flat bed without using bedrails?: None Help needed moving from lying on your back to sitting on the side of a flat bed without using bedrails?: None Help needed moving to and from a bed to a chair (including a wheelchair)?: A Little Help needed standing up from a chair using your arms (e.g., wheelchair or bedside chair)?: A Little Help needed to walk in hospital room?: A Little Help needed climbing 3-5 steps with a railing? : A Little 6 Click Score:  20    End of Session   Activity Tolerance: Patient tolerated treatment well Patient left: in chair;with call bell/phone within reach;with family/visitor present Nurse Communication: Mobility status PT Visit Diagnosis: Unsteadiness on feet (R26.81);Muscle weakness (generalized) (M62.81)     Time: 4982-6415 PT Time Calculation (min) (ACUTE ONLY): 9 min  Charges:  $Gait Training: 8-22 mins                     [facility] PT Acute Rehabilitation Services Pager [phone] Office [phone]    Angelina Ok [facility] 03/18/2020, 4:42 PM

## 2020-03-18 NOTE — Procedures (Signed)
Echo attempted. Patient eating and receiving nursing care. Will attempt again later.

## 2020-03-18 NOTE — Discharge Instructions (Signed)
Information on my medicine - ELIQUIS (apixaban)  This medication education was reviewed with me or my healthcare representative as part of my discharge preparation.  The pharmacist that spoke with me during my hospital stay was:  Leander Rams, [facility]  Why was Eliquis prescribed for you? Eliquis was prescribed for you to reduce the risk of a blood clot forming that can cause a stroke if you have a medical condition called atrial fibrillation (a type of irregular heartbeat).  What do You need to know about Eliquis ? Take your Eliquis TWICE DAILY - one tablet in the morning and one tablet in the evening with or without food. If you have difficulty swallowing the tablet whole please discuss with your pharmacist how to take the medication safely.  Take Eliquis exactly as prescribed by your doctor and DO NOT stop taking Eliquis without talking to the doctor who prescribed the medication.  Stopping may increase your risk of developing a stroke.  Refill your prescription before you run out.  After discharge, you should have regular check-up appointments with your healthcare provider that is prescribing your Eliquis.  In the future your dose may need to be changed if your kidney function or weight changes by a significant amount or as you get older.  What do you do if you miss a dose? If you miss a dose, take it as soon as you remember on the same day and resume taking twice daily.  Do not take more than one dose of ELIQUIS at the same time to make up a missed dose.  Important Safety Information A possible side effect of Eliquis is bleeding. You should call your healthcare provider right away if you experience any of the following: ? Bleeding from an injury or your nose that does not stop. ? Unusual colored urine (red or dark brown) or unusual colored stools (red or black). ? Unusual bruising for unknown reasons. ? A serious fall or if you hit your head (even if there is no bleeding).  Some  medicines may interact with Eliquis and might increase your risk of bleeding or clotting while on Eliquis. To help avoid this, consult your healthcare provider or pharmacist prior to using any new prescription or non-prescription medications, including herbals, vitamins, non-steroidal anti-inflammatory drugs (NSAIDs) and supplements.  This website has more information on Eliquis (apixaban): http://www.eliquis.com/eliquis/home ============================   Atrial Fibrillation    Atrial fibrillation is a type of heartbeat that is irregular or fast. If you have this condition, your heart beats without any order. This makes it hard for your heart to pump blood in a normal way. Atrial fibrillation may come and go, or it may become a long-lasting problem. If this condition is not treated, it can put you at higher risk for stroke, heart failure, and other heart problems.  What are the causes? This condition may be caused by diseases that damage the heart. They include:  High blood pressure.  Heart failure.  Heart valve disease.  Heart surgery. Other causes include:  Diabetes.  Thyroid disease.  Being overweight.  Kidney disease. Sometimes the cause is not known.  What increases the risk? You are more likely to develop this condition if:  You are older.  You smoke.  You exercise often and very hard.  You have a family history of this condition.  You are a man.  You use drugs.  You drink a lot of alcohol.  You have lung conditions, such as emphysema, pneumonia, or COPD.  You have sleep apnea.   What are the signs or symptoms? Common symptoms of this condition include:  A feeling that your heart is beating very fast.  Chest pain or discomfort.  Feeling short of breath.  Suddenly feeling light-headed or weak.  Getting tired easily during activity.  Fainting.  Sweating. In some cases, there are no symptoms.  How is this treated? Treatment for this  condition depends on underlying conditions and how you feel when you have atrial fibrillation. They include: 1. Medicines to: ? Prevent blood clots. ? Treat heart rate or heart rhythm problems. 2. Using devices, such as a pacemaker, to correct heart rhythm problems. 3. Doing surgery to remove the part of the heart that sends bad signals. 4. Closing an area where clots can form in the heart (left atrial appendage). In some cases, your doctor will treat other underlying conditions.  Follow these instructions at home:  Medicines 1. Take over-the-counter and prescription medicines only as told by your doctor. 2. Do not take any new medicines without first talking to your doctor. 3. If you are taking blood thinners: ? Talk with your doctor before you take any medicines that have aspirin or NSAIDs, such as ibuprofen, in them. ? Take your medicine exactly as told by your doctor. Take it at the same time each day. ? Avoid activities that could hurt or bruise you. Follow instructions about how to prevent falls. ? Wear a bracelet that says you are taking blood thinners. Or, carry a card that lists what medicines you take. Lifestyle          Do not use any products that have nicotine or tobacco in them. These include cigarettes, e-cigarettes, and chewing tobacco. If you need help quitting, ask your doctor.  Eat heart-healthy foods. Talk with your doctor about the right eating plan for you.  Exercise regularly as told by your doctor.  Do not drink alcohol.  Lose weight if you are overweight.  Do not use drugs, including cannabis.  General instructions  If you have a condition that causes breathing to stop for a short period of time (apnea), treat it as told by your doctor.  Keep a healthy weight. Do not use diet pills unless your doctor says they are safe for you. Diet pills may make heart problems worse.  Keep all follow-up visits as told by your doctor. This is important.  Contact  a doctor if:  You notice a change in the speed, rhythm, or strength of your heartbeat.  You are taking a blood-thinning medicine and you get more bruising.  You get tired more easily when you move or exercise.  You have a sudden change in weight.  Get help right away if:    1. You have pain in your chest or your belly (abdomen). 2. You have trouble breathing. 3. You have side effects of blood thinners, such as blood in your vomit, poop (stool), or pee (urine), or bleeding that cannot stop. 4. You have any signs of a stroke. "BE FAST" is an easy way to remember the main warning signs: ? B - Balance. Signs are dizziness, sudden trouble walking, or loss of balance. ? E - Eyes. Signs are trouble seeing or a change in how you see. ? F - Face. Signs are sudden weakness or loss of feeling in the face, or the face or eyelid drooping on one side. ? A - Arms. Signs are weakness or loss of feeling in an arm. This  happens suddenly and usually on one side of the body. ? S - Speech. Signs are sudden trouble speaking, slurred speech, or trouble understanding what people say. ? T - Time. Time to call emergency services. Write down what time symptoms started. 5. You have other signs of a stroke, such as: ? A sudden, very bad headache with no known cause. ? Feeling like you may vomit (nausea). ? Vomiting. ? A seizure.  These symptoms may be an emergency. Do not wait to see if the symptoms will go away. Get medical help right away. Call your local emergency services (911 in the [place] Do not drive yourself to the hospital. Summary  Atrial fibrillation is a type of heartbeat that is irregular or fast.  You are at higher risk of this condition if you smoke, are older, have diabetes, or are overweight.  Follow your doctor's instructions about medicines, diet, exercise, and follow-up visits.  Get help right away if you have signs or symptoms of a stroke.  Get help right away if you cannot catch your  breath, or you have chest pain or discomfort. This information is not intended to replace advice given to you by your health care provider. Make sure you discuss any questions you have with your health care provider. Document Revised: 10/29/2018 Document Reviewed: 10/29/2018 Elsevier Patient Education  2020 [employer].

## 2020-03-18 NOTE — Progress Notes (Signed)
°  Echocardiogram 2D Echocardiogram has been performed.  Gerda Diss 03/18/2020, 11:11 AM

## 2020-03-19 DIAGNOSIS — I4891 Unspecified atrial fibrillation: Secondary | ICD-10-CM | POA: Diagnosis not present

## 2020-03-19 LAB — BASIC METABOLIC PANEL
Anion gap: 13 (ref 5–15)
BUN: 11 mg/dL (ref 8–23)
CO2: 19 mmol/L — ABNORMAL LOW (ref 22–32)
Calcium: 8.9 mg/dL (ref 8.9–10.3)
Chloride: 99 mmol/L (ref 98–111)
Creatinine, Ser: 0.61 mg/dL (ref 0.44–1.00)
GFR, Estimated: >60 mL/min (ref >60)
Glucose, Bld: 119 mg/dL — ABNORMAL HIGH (ref 70–99)
Potassium: 3.9 mmol/L (ref 3.5–5.1)
Sodium: 131 mmol/L — ABNORMAL LOW (ref 135–145)

## 2020-03-19 LAB — MAGNESIUM: Magnesium: 1.8 mg/dL (ref 1.7–2.4)

## 2020-03-19 MED ORDER — MAGNESIUM SULFATE 2 GM/50ML IV SOLN
2.0000 g | Freq: Once | INTRAVENOUS | Status: AC
  Administered 2020-03-19: 2 g via INTRAVENOUS
  Filled 2020-03-19: qty 50

## 2020-03-19 MED ORDER — DILTIAZEM HCL ER COATED BEADS 240 MG PO CP24
240.0000 mg | ORAL_CAPSULE | Freq: Every day | ORAL | Status: DC
  Administered 2020-03-19 – 2020-03-20 (×2): 240 mg via ORAL
  Filled 2020-03-19 (×2): qty 1

## 2020-03-19 NOTE — Progress Notes (Signed)
PROGRESS NOTE    Regina Taylor  RDE:[id number] DOB: [birth year] DOA: 03/17/2020 PCP: Merri Brunette, MD   Brief Narrative:  Patient is 86 year old female with past medical history of palpitations and SVT, GERD, glaucoma presents to emergency department for shortness of breath and orthopnea since few days.  She went to see her PCP and was found to have A. fib with elevated heart rate in 120s therefore she sent to ER for further evaluation and management.  Reports chronic intermittent nonbloody diarrhea since summer.  ED course: Patient received metoprolol and started on Cardizem drip for new onset A. fib with RVR.  Magnesium: Very low.  BNP: 417, initial troponin: 12, TSH: WNL, chest x-ray showed cardiomegaly with diffuse bilateral interstitial prominence.  Magnesium was replenished in ED.  Patient admitted for further evaluation and management of new onset A. fib with RVR and CHF.  Assessment & Plan:   New onset A. fib with RVR: -Troponin: Negative.  TSH: WNL -Initially was on IV Cardizem-switch to p.o. Cardizem 240 mg daily -Echo shows ejection fraction of 60 to 65%, moderate LVH, diastolic function could not be evaluated.  Mild MR, AR, mild aortic valve sclerosis.  Mild pulmonary hypertension, severe LAE. -CHA2DS2-VASc score: 4.  She is now on Eliquis for anticoagulation. -Appreciate cardiology help. -Monitor electrolytes closely.  New onset diastolic CHF: -BNP elevated at 417.  Reviewed chest x-ray. -TSH: WNL -Strict INO's and daily weight.  Monitor electrolytes. -Reviewed echo as above  SVT/palpitation: Continue metoprolol and Cardizem -Monitor on telemetry  chronic intermittent diarrhea: Unknown etiology -TSH is WNL.  Less likely infectious.  Patient is afebrile with no leukocytosis.  No abdominal tenderness.  No vomiting -Encourage increased p.o. intake -As needed Imodium  Hyponatremia: Sodium 131 -Patient is asymptomatic. -Repeat BMP tomorrow a.m.  Hypomagnesemia:  Replenished.   DVT prophylaxis: Eliquis/SCD Code Status: Full code Family Communication: Patient's son present at bedside.  Plan of care discussed with patient in length and he verbalized understanding and agreed with it. Disposition Plan: Likely home tomorrow Consultants:   Cardiology  Procedures:   Echo  Antimicrobials:   None  Status is: Inpatient   Dispo: The patient is from: Home              Anticipated d/c is to: Home              Anticipated d/c date is: 03/20/2020              Patient currently not medically stable for discharge   Subjective: Patient seen and examined.  Sitting comfortably on the bed.  Tells me that she is doing much better today.  Thinks that magnesium helps in her symptoms.  Denies shortness of breath chest pain, leg swelling, palpitation.  Objective: Vitals:   03/18/20 2110 03/19/20 0311 03/19/20 0320 03/19/20 0740  BP: (!) 142/78 124/83 124/83 (!) 132/97  Pulse: 100 (!) 108  100  Resp: 20 18  20   Temp: 98.1 F (36.7 C) 97.7 F (36.5 C)  97.6 F (36.4 C)  TempSrc: Oral Oral  Oral  SpO2: 100% 95%  98%  Weight:  78.2 kg    Height:        Intake/Output Summary (Last 24 hours) at 03/19/2020 1223 Last data filed at 03/19/2020 0641 Gross per 24 hour  Intake 493.83 ml  Output 700 ml  Net -206.17 ml   Filed Weights   03/18/20 0434 03/19/20 0311  Weight: 78 kg 78.2 kg    Examination: General exam:  Appears calm and comfortable, on room air, communicating well Respiratory system: Clear to auscultation. Respiratory effort normal. Cardiovascular system: S1 & S2 heard, RRR. No JVD, murmurs, rubs, gallops or clicks. No pedal edema. Gastrointestinal system: Abdomen is nondistended, soft and nontender. No organomegaly or masses felt. Normal bowel sounds heard. Central nervous system: Alert and oriented. No focal neurological deficits. Extremities: Symmetric 5 x 5 power. Skin: No rashes, lesions or ulcers. Psychiatry: Judgement and insight  appear normal. Mood & affect appropriate.    Data Reviewed: I have personally reviewed following labs and imaging studies  CBC: Recent Labs  Lab 03/17/20 1444 03/18/20 0206  WBC 5.4 6.2  NEUTROABS 3.4  --   HGB 13.2 12.5  HCT 40.0 37.4  MCV 93.2 91.0  PLT 190 172   Basic Metabolic Panel: Recent Labs  Lab 03/17/20 1444 03/18/20 0206 03/19/20 0416  NA 136 134* 131*  K 4.3 3.9 3.9  CL 103 103 99  CO2 24 22 19*  GLUCOSE 113* 113* 119*  BUN CREATININE 0.67 0.63 0.61  CALCIUM 9.3 8.9 8.9  MG <0.1* 2.1 1.8  PHOS  --  3.1  --    GFR: Estimated Creatinine Clearance: 48.3 mL/min (by C-G formula based on SCr of 0.61 mg/dL). Liver Function Tests: No results for input(s): AST, ALT, ALKPHOS, BILITOT, PROT, ALBUMIN in the last 168 hours. No results for input(s): LIPASE, AMYLASE in the last 168 hours. No results for input(s): AMMONIA in the last 168 hours. Coagulation Profile: No results for input(s): INR, PROTIME in the last 168 hours. Cardiac Enzymes: No results for input(s): CKTOTAL, CKMB, CKMBINDEX, TROPONINI in the last 168 hours. BNP (last 3 results) No results for input(s): PROBNP in the last 8760 hours. HbA1C: No results for input(s): HGBA1C in the last 72 hours. CBG: No results for input(s): GLUCAP in the last 168 hours. Lipid Profile: No results for input(s): CHOL, HDL, LDLCALC, TRIG, CHOLHDL, LDLDIRECT in the last 72 hours. Thyroid Function Tests: Recent Labs    03/17/20 1420 03/17/20 1744  TSH 1.341  --   FREET4  --  1.21*   Anemia Panel: No results for input(s): VITAMINB12, FOLATE, FERRITIN, TIBC, IRON, RETICCTPCT in the last 72 hours. Sepsis Labs: Recent Labs  Lab 03/17/20 1442 03/17/20 1559  LATICACIDVEN 1.5 1.4    Recent Results (from the past 240 hour(s))  Respiratory Panel by RT PCR (Flu A&B, Covid) - Nasopharyngeal Swab     Status: None   Collection Time: 03/17/20  2:41 PM   Specimen: Nasopharyngeal Swab  Result Value Ref Range  Status   SARS Coronavirus 2 by RT PCR NEGATIVE NEGATIVE Final    Comment: (NOTE) SARS-CoV-2 target nucleic acids are NOT DETECTED.  The SARS-CoV-2 RNA is generally detectable in upper respiratoy specimens during the acute phase of infection. The lowest concentration of SARS-CoV-2 viral copies this assay can detect is 131 copies/mL. A negative result does not preclude SARS-Cov-2 infection and should not be used as the sole basis for treatment or other patient management decisions. A negative result may occur with  improper specimen collection/handling, submission of specimen other than nasopharyngeal swab, presence of viral mutation(s) within the areas targeted by this assay, and inadequate number of viral copies (<131 copies/mL). A negative result must be combined with clinical observations, patient history, and epidemiological information. The expected result is Negative.  Fact Sheet for Patients:  https://www.moore.com/  Fact Sheet for Healthcare Providers:  https://www.young.biz/  This test is no t yet  approved or cleared by the [place] and  has been authorized for detection and/or diagnosis of SARS-CoV-2 by FDA under an Emergency Use Authorization (EUA). This EUA will remain  in effect (meaning this test can be used) for the duration of the COVID-19 declaration under Section 564(b)(1) of the Act, 21 U.S.C. section 360bbb-3(b)(1), unless the authorization is terminated or revoked sooner.     Influenza A by PCR NEGATIVE NEGATIVE Final   Influenza B by PCR NEGATIVE NEGATIVE Final    Comment: (NOTE) The Xpert Xpress SARS-CoV-2/FLU/RSV assay is intended as an aid in  the diagnosis of influenza from Nasopharyngeal swab specimens and  should not be used as a sole basis for treatment. Nasal washings and  aspirates are unacceptable for Xpert Xpress SARS-CoV-2/FLU/RSV  testing.  Fact Sheet for  Patients: https://www.moore.com/  Fact Sheet for Healthcare Providers: https://www.young.biz/  This test is not yet approved or cleared by the [place] FDA and  has been authorized for detection and/or diagnosis of SARS-CoV-2 by  FDA under an Emergency Use Authorization (EUA). This EUA will remain  in effect (meaning this test can be used) for the duration of the  Covid-19 declaration under Section 564(b)(1) of the Act, 21  U.S.C. section 360bbb-3(b)(1), unless the authorization is  terminated or revoked. Performed at [facility], [address] [place], [place] [address]       Radiology Studies: DG Chest Port 1 View  Result Date: 03/17/2020 CLINICAL DATA:  Shortness of breath.  Cough. EXAM: PORTABLE CHEST 1 VIEW COMPARISON:  No prior. FINDINGS: Cardiomegaly. No pulmonary venous congestion. Diffuse bilateral interstitial prominence. Interstitial edema and/or pneumonitis could present this fashion. Tiny bilateral pleural effusions can not be excluded. No pneumothorax. Thoracic spine scoliosis. IMPRESSION: 1. Cardiomegaly. No pulmonary venous congestion. 2. Diffuse bilateral interstitial prominence. Interstitial edema and/or pneumonitis could present in this fashion. Electronically Signed   By: Maisie Fus  Register   On: 03/17/2020 14:23   ECHOCARDIOGRAM COMPLETE  Result Date: 03/18/2020    ECHOCARDIOGRAM REPORT   Patient Name:   SAHVANNA MCMANIGAL Date of Exam: 03/18/2020 Medical Rec #:  [id number]       Height:       65.0 in Accession #:    [id number]      Weight:       171.9 lb Date of Birth:  11/01/1927        BSA:          1.855 m Patient Age:    89 years        BP:           116/64 mmHg Patient Gender: F               HR:           100 bpm. Exam Location:  Inpatient Procedure: 2D Echo, Cardiac Doppler and Color Doppler Indications:     Congestive Heart Failure  History:         Patient has prior history of Echocardiogram examinations, most                   recent 01/26/2014. Arrythmias:Atrial Fibrillation; Risk                  Factors:Former Smoker. MVP. DOE.  Sonographer:     Ross Ludwig RDCS (AE) Referring Phys:  [id number] Gertha Calkin Diagnosing Phys: Lennie Odor MD IMPRESSIONS  1. Left ventricular ejection fraction, by estimation, is 60 to 65%. The  left ventricle has normal function. The left ventricle has no regional wall motion abnormalities. There is moderate concentric left ventricular hypertrophy. Left ventricular diastolic function could not be evaluated.  2. Right ventricular systolic function is normal. The right ventricular size is normal. There is moderately elevated pulmonary artery systolic pressure. The estimated right ventricular systolic pressure is 45.2 mmHg.  3. Left atrial size was severely dilated.  4. Right atrial size was mildly dilated.  5. The mitral valve is degenerative. Trivial mitral valve regurgitation. No evidence of mitral stenosis.  6. The aortic valve is tricuspid. There is mild calcification of the aortic valve. There is mild thickening of the aortic valve. Aortic valve regurgitation is mild. Mild aortic valve sclerosis is present, with no evidence of aortic valve stenosis.  7. The inferior vena cava is dilated in size with <50% respiratory variability, suggesting right atrial pressure of 15 mmHg. Comparison(s): Changes from prior study are noted. In Afib with RVR. EF remains the same. RVSP ~45 mmHG on this study. FINDINGS  Left Ventricle: Left ventricular ejection fraction, by estimation, is 60 to 65%. The left ventricle has normal function. The left ventricle has no regional wall motion abnormalities. The left ventricular internal cavity size was normal in size. There is  moderate concentric left ventricular hypertrophy. Left ventricular diastolic function could not be evaluated due to atrial fibrillation. Left ventricular diastolic function could not be evaluated. Right Ventricle: The right ventricular size is normal.  No increase in right ventricular wall thickness. Right ventricular systolic function is normal. There is moderately elevated pulmonary artery systolic pressure. The tricuspid regurgitant velocity is 2.75 m/s, and with an assumed right atrial pressure of 15 mmHg, the estimated right ventricular systolic pressure is 45.2 mmHg. Left Atrium: Left atrial size was severely dilated. Right Atrium: Right atrial size was mildly dilated. Pericardium: Trivial pericardial effusion is present. Presence of pericardial fat pad. Mitral Valve: The mitral valve is degenerative in appearance. Mild to moderate mitral annular calcification. Trivial mitral valve regurgitation. No evidence of mitral valve stenosis. Tricuspid Valve: The tricuspid valve is grossly normal. Tricuspid valve regurgitation is mild . No evidence of tricuspid stenosis. Aortic Valve: The aortic valve is tricuspid. There is mild calcification of the aortic valve. There is mild thickening of the aortic valve. Aortic valve regurgitation is mild. Aortic regurgitation PHT measures 716 msec. Mild aortic valve sclerosis is present, with no evidence of aortic valve stenosis. Aortic valve mean gradient measures 6.0 mmHg. Aortic valve peak gradient measures 10.1 mmHg. Aortic valve area, by VTI measures 1.64 cm. Pulmonic Valve: The pulmonic valve was grossly normal. Pulmonic valve regurgitation is not visualized. No evidence of pulmonic stenosis. Aorta: The aortic root is normal in size and structure. Venous: The inferior vena cava is dilated in size with less than 50% respiratory variability, suggesting right atrial pressure of 15 mmHg. IAS/Shunts: The atrial septum is grossly normal. Additional Comments: There is a small pleural effusion in the left lateral region.  LEFT VENTRICLE PLAX 2D LVIDd:         3.80 cm LVIDs:         2.40 cm LV PW:         1.48 cm LV IVS:        1.50 cm LVOT diam:     1.90 cm LV SV:         55 LV SV Index:   30 LVOT Area:     2.84 cm  RIGHT  VENTRICLE  IVC RV Basal diam:  3.20 cm    IVC diam: 2.40 cm RV S prime:     9.45 cm/s TAPSE (M-mode): 2.3 cm LEFT ATRIUM              Index       RIGHT ATRIUM           Index LA diam:        4.00 cm  2.16 cm/m  RA Area:     21.30 cm LA Vol (A2C):   103.0 ml 55.53 ml/m RA Volume:   62.00 ml  33.43 ml/m LA Vol (A4C):   96.3 ml  51.92 ml/m LA Biplane Vol: 100.0 ml 53.91 ml/m  AORTIC VALVE AV Area (Vmax):    2.00 cm AV Area (Vmean):   1.70 cm AV Area (VTI):     1.64 cm AV Vmax:           159.00 cm/s AV Vmean:          116.000 cm/s AV VTI:            0.334 m AV Peak Grad:      10.1 mmHg AV Mean Grad:      6.0 mmHg LVOT Vmax:         112.00 cm/s LVOT Vmean:        69.500 cm/s LVOT VTI:          0.193 m LVOT/AV VTI ratio: 0.58 AI PHT:            716 msec  AORTA Ao Root diam: 3.20 cm TRICUSPID VALVE TR Peak grad:   30.2 mmHg TR Vmax:        275.00 cm/s  SHUNTS Systemic VTI:  0.19 m Systemic Diam: 1.90 cm Lennie Odor MD Electronically signed by Lennie Odor MD Signature Date/Time: 03/18/2020/2:39:14 PM    Final (Updated)     Scheduled Meds: . apixaban  5 mg Oral BID  . diltiazem  240 mg Oral Daily  . metoprolol succinate  50 mg Oral Daily   Continuous Infusions:    LOS: 2 days   Time spent: 35 minutes   Shristi Scheib Estill Cotta, MD [facility]  If 7PM-7AM, please contact night-coverage [email or web address] 03/19/2020, 12:23 PM

## 2020-03-19 NOTE — Progress Notes (Signed)
Progress Note  Patient Name: Regina SallesJane T Taylor Date of Encounter: 03/19/2020  Primary Cardiologist: Dr. SwazilandJordan  Subjective   Feels improved following magnesium replacement;  Inpatient Medications    Scheduled Meds: . apixaban  5 mg Oral BID  . diltiazem  60 mg Oral Q6H  . metoprolol succinate  50 mg Oral Daily   Continuous Infusions:  PRN Meds: acetaminophen, ondansetron (ZOFRAN) IV   Vital Signs    Vitals:   03/18/20 2110 03/19/20 0311 03/19/20 0320 03/19/20 0740  BP: (!) 142/78 124/83 124/83 (!) 132/97  Pulse: 100 (!) 108  100  Resp: 20 18  20   Temp: 98.1 F (36.7 C) 97.7 F (36.5 C)  97.6 F (36.4 C)  TempSrc: Oral Oral  Oral  SpO2: 100% 95%  98%  Weight:  78.2 kg    Height:        Intake/Output Summary (Last 24 hours) at 03/19/2020 1144 Last data filed at 03/19/2020 0641 Gross per 24 hour  Intake 493.83 ml  Output 700 ml  Net -206.17 ml    I/O since admission: -64  Filed Weights   03/18/20 0434 03/19/20 0311  Weight: 78 kg 78.2 kg    Telemetry    AF at 112 - Personally Reviewed  ECG    ECG (independently read by me): Atrial fibrillation at 116  Physical Exam   BP (!) 132/97 (BP Location: Right Arm)   Pulse 100   Temp 97.6 F (36.4 C) (Oral)   Resp 20   Ht 5\' 5"  (1.651 m)   Wt 78.2 kg   SpO2 98%   BMI 28.71 kg/m  General: Alert, oriented, no distress.  Skin: normal turgor, no rashes, warm and dry HEENT: Normocephalic, atraumatic. Pupils equal round and reactive to light; sclera anicteric; extraocular muscles intact; Nose without nasal septal hypertrophy Mouth/Parynx benign; Mallinpatti scale Neck: No JVD, no carotid bruits; normal carotid upstroke Lungs: clear to ausculatation and percussion; no wheezing or rales Chest wall: without tenderness to palpitation Heart: PMI not displaced, irregular regular with heart rate approximately 112 bpm, s1 s2 normal, 1/6 systolic murmur, no diastolic murmur, no rubs, gallops, thrills, or  heaves Abdomen: soft, nontender; no hepatosplenomehaly, BS+; abdominal aorta nontender and not dilated by palpation. Back: no CVA tenderness Pulses 2+ Musculoskeletal: full range of motion, normal strength, no joint deformities Extremities: no clubbing cyanosis or edema, Homan's sign negative  Neurologic: grossly nonfocal; Cranial nerves grossly wnl Psychologic: Normal mood and affect   Labs    Chemistry Recent Labs  Lab 03/17/20 1444 03/18/20 0206 03/19/20 0416  NA 136 134* 131*  K 4.3 3.9 3.9  CL 103 103 99  CO2 24 22 19*  GLUCOSE 113* 113* 119*  BUN 10 12 11   CREATININE 0.67 0.63 0.61  CALCIUM 9.3 8.9 8.9  GFRNONAA >60 >60 >60  ANIONGAP 9 9 13      Hematology Recent Labs  Lab 03/17/20 1444 03/18/20 0206  WBC 5.4 6.2  RBC 4.29 4.11  HGB 13.2 12.5  HCT 40.0 37.4  MCV 93.2 91.0  MCH 30.8 30.4  MCHC 33.0 33.4  RDW 13.1 13.0  PLT 190 172    Cardiac EnzymesNo results for input(s): TROPONINI in the last 168 hours. No results for input(s): TROPIPOC in the last 168 hours.   BNP Recent Labs  Lab 03/17/20 1444  BNP 417.4*     DDimer No results for input(s): DDIMER in the last 168 hours.   Lipid Panel     Component  Value Date/Time   CHOL 147 01/25/2014 2340   TRIG 63 01/25/2014 2340   HDL 47 01/25/2014 2340   CHOLHDL 3.1 01/25/2014 2340   VLDL 13 01/25/2014 2340   LDLCALC 87 01/25/2014 2340     Radiology    DG Chest Port 1 View  Result Date: 03/17/2020 CLINICAL DATA:  Shortness of breath.  Cough. EXAM: PORTABLE CHEST 1 VIEW COMPARISON:  No prior. FINDINGS: Cardiomegaly. No pulmonary venous congestion. Diffuse bilateral interstitial prominence. Interstitial edema and/or pneumonitis could present this fashion. Tiny bilateral pleural effusions can not be excluded. No pneumothorax. Thoracic spine scoliosis. IMPRESSION: 1. Cardiomegaly. No pulmonary venous congestion. 2. Diffuse bilateral interstitial prominence. Interstitial edema and/or pneumonitis could  present in this fashion. Electronically Signed   By: Maisie Fus  Register   On: 03/17/2020 14:23   ECHOCARDIOGRAM COMPLETE  Result Date: 03/18/2020    ECHOCARDIOGRAM REPORT   Patient Name:   Regina Taylor Date of Exam: 03/18/2020 Medical Rec #:  [id number]       Height:       65.0 in Accession #:    [id number]      Weight:       171.9 lb Date of Birth:  Jan 02, 1928        BSA:          1.855 m Patient Age:    86 years        BP:           116/64 mmHg Patient Gender: F               HR:           100 bpm. Exam Location:  Inpatient Procedure: 2D Echo, Cardiac Doppler and Color Doppler Indications:     Congestive Heart Failure  History:         Patient has prior history of Echocardiogram examinations, most                  recent 01/26/2014. Arrythmias:Atrial Fibrillation; Risk                  Factors:Former Smoker. MVP. DOE.  Sonographer:     Ross Ludwig RDCS (AE) Referring Phys:  [id number] Gertha Calkin Diagnosing Phys: Lennie Odor MD IMPRESSIONS  1. Left ventricular ejection fraction, by estimation, is 60 to 65%. The left ventricle has normal function. The left ventricle has no regional wall motion abnormalities. There is moderate concentric left ventricular hypertrophy. Left ventricular diastolic function could not be evaluated.  2. Right ventricular systolic function is normal. The right ventricular size is normal. There is moderately elevated pulmonary artery systolic pressure. The estimated right ventricular systolic pressure is 45.2 mmHg.  3. Left atrial size was severely dilated.  4. Right atrial size was mildly dilated.  5. The mitral valve is degenerative. Trivial mitral valve regurgitation. No evidence of mitral stenosis.  6. The aortic valve is tricuspid. There is mild calcification of the aortic valve. There is mild thickening of the aortic valve. Aortic valve regurgitation is mild. Mild aortic valve sclerosis is present, with no evidence of aortic valve stenosis.  7. The inferior vena cava is dilated in size  with <50% respiratory variability, suggesting right atrial pressure of 15 mmHg. Comparison(s): Changes from prior study are noted. In Afib with RVR. EF remains the same. RVSP ~45 mmHG on this study. FINDINGS  Left Ventricle: Left ventricular ejection fraction, by estimation, is 60 to 65%. The left ventricle has normal function. The left  ventricle has no regional wall motion abnormalities. The left ventricular internal cavity size was normal in size. There is  moderate concentric left ventricular hypertrophy. Left ventricular diastolic function could not be evaluated due to atrial fibrillation. Left ventricular diastolic function could not be evaluated. Right Ventricle: The right ventricular size is normal. No increase in right ventricular wall thickness. Right ventricular systolic function is normal. There is moderately elevated pulmonary artery systolic pressure. The tricuspid regurgitant velocity is 2.75 m/s, and with an assumed right atrial pressure of 15 mmHg, the estimated right ventricular systolic pressure is 45.2 mmHg. Left Atrium: Left atrial size was severely dilated. Right Atrium: Right atrial size was mildly dilated. Pericardium: Trivial pericardial effusion is present. Presence of pericardial fat pad. Mitral Valve: The mitral valve is degenerative in appearance. Mild to moderate mitral annular calcification. Trivial mitral valve regurgitation. No evidence of mitral valve stenosis. Tricuspid Valve: The tricuspid valve is grossly normal. Tricuspid valve regurgitation is mild . No evidence of tricuspid stenosis. Aortic Valve: The aortic valve is tricuspid. There is mild calcification of the aortic valve. There is mild thickening of the aortic valve. Aortic valve regurgitation is mild. Aortic regurgitation PHT measures 716 msec. Mild aortic valve sclerosis is present, with no evidence of aortic valve stenosis. Aortic valve mean gradient measures 6.0 mmHg. Aortic valve peak gradient measures 10.1 mmHg.  Aortic valve area, by VTI measures 1.64 cm. Pulmonic Valve: The pulmonic valve was grossly normal. Pulmonic valve regurgitation is not visualized. No evidence of pulmonic stenosis. Aorta: The aortic root is normal in size and structure. Venous: The inferior vena cava is dilated in size with less than 50% respiratory variability, suggesting right atrial pressure of 15 mmHg. IAS/Shunts: The atrial septum is grossly normal. Additional Comments: There is a small pleural effusion in the left lateral region.  LEFT VENTRICLE PLAX 2D LVIDd:         3.80 cm LVIDs:         2.40 cm LV PW:         1.48 cm LV IVS:        1.50 cm LVOT diam:     1.90 cm LV SV:         55 LV SV Index:   30 LVOT Area:     2.84 cm  RIGHT VENTRICLE            IVC RV Basal diam:  3.20 cm    IVC diam: 2.40 cm RV S prime:     9.45 cm/s TAPSE (M-mode): 2.3 cm LEFT ATRIUM              Index       RIGHT ATRIUM           Index LA diam:        4.00 cm  2.16 cm/m  RA Area:     21.30 cm LA Vol (A2C):   103.0 ml 55.53 ml/m RA Volume:   62.00 ml  33.43 ml/m LA Vol (A4C):   96.3 ml  51.92 ml/m LA Biplane Vol: 100.0 ml 53.91 ml/m  AORTIC VALVE AV Area (Vmax):    2.00 cm AV Area (Vmean):   1.70 cm AV Area (VTI):     1.64 cm AV Vmax:           159.00 cm/s AV Vmean:          116.000 cm/s AV VTI:            0.334 m AV Peak  Grad:      10.1 mmHg AV Mean Grad:      6.0 mmHg LVOT Vmax:         112.00 cm/s LVOT Vmean:        69.500 cm/s LVOT VTI:          0.193 m LVOT/AV VTI ratio: 0.58 AI PHT:            716 msec  AORTA Ao Root diam: 3.20 cm TRICUSPID VALVE TR Peak grad:   30.2 mmHg TR Vmax:        275.00 cm/s  SHUNTS Systemic VTI:  0.19 m Systemic Diam: 1.90 cm Lennie Odor MD Electronically signed by Lennie Odor MD Signature Date/Time: 03/18/2020/2:39:14 PM    Final (Updated)     Cardiac Studies   Echo 03/18/2020 1. Left ventricular ejection fraction, by estimation, is 60 to 65%. The  left ventricle has normal function. The left ventricle has no  regional  wall motion abnormalities. There is moderate concentric left ventricular  hypertrophy. Left ventricular  diastolic function could not be evaluated.  2. Right ventricular systolic function is normal. The right ventricular  size is normal. There is moderately elevated pulmonary artery systolic  pressure. The estimated right ventricular systolic pressure is 45.2 mmHg.  3. Left atrial size was severely dilated.  4. Right atrial size was mildly dilated.  5. The mitral valve is degenerative. Trivial mitral valve regurgitation.  No evidence of mitral stenosis.  6. The aortic valve is tricuspid. There is mild calcification of the  aortic valve. There is mild thickening of the aortic valve. Aortic valve  regurgitation is mild. Mild aortic valve sclerosis is present, with no  evidence of aortic valve stenosis.  7. The inferior vena cava is dilated in size with <50% respiratory  variability, suggesting right atrial pressure of 15 mmHg.    Patient Profile     Regina Taylor is a 86 y.o. 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 Jacqulyn Bath.  Assessment & Plan    1.  AF with RVR;  patient continues to have AF with RVR with heart rates in the low 100s 06/22/2015 range.  She has received several doses of oral Cardizem at 60 mg every 6 hours.  Will transition to Cardizem CD 240 mg today.  I have not recommended that the patient go home today but used today to further obtain improved rate control.  If heart rate continues to be elevated slight titration of beta-blocker may be necessary.  Echo reveals preserved function with moderate.  There is evidence for severe dilation with mild.  CHA2DS2-VASc score is 4; she is now on Eliquis for anticoagulation.  2.  Probable diastolic heart failure; diastolic function not  able to be evaluated echo. BNP 417  3.  History of SVT.  The patient on metoprolol succinate 50 mg daily.  If heart rate continues to be increased despite the addition of Cardizem CD 240 mg, slight titration of beta-blocker may be necessary.  4 Hypomagnesemia: Status post repletion  Anticipate discharge tomorrow  Signed, Lennette Bihari, MD, [name] 03/19/2020, 11:44 AM

## 2020-03-20 DIAGNOSIS — I4891 Unspecified atrial fibrillation: Secondary | ICD-10-CM | POA: Diagnosis not present

## 2020-03-20 DIAGNOSIS — I471 Supraventricular tachycardia: Secondary | ICD-10-CM | POA: Diagnosis not present

## 2020-03-20 DIAGNOSIS — I5189 Other ill-defined heart diseases: Secondary | ICD-10-CM | POA: Diagnosis not present

## 2020-03-20 LAB — BASIC METABOLIC PANEL
Anion gap: 8 (ref 5–15)
BUN: 9 mg/dL (ref 8–23)
CO2: 22 mmol/L (ref 22–32)
Calcium: 9 mg/dL (ref 8.9–10.3)
Chloride: 105 mmol/L (ref 98–111)
Creatinine, Ser: 0.61 mg/dL (ref 0.44–1.00)
GFR, Estimated: >60 mL/min (ref >60)
Glucose, Bld: 101 mg/dL — ABNORMAL HIGH (ref 70–99)
Potassium: 3.7 mmol/L (ref 3.5–5.1)
Sodium: 135 mmol/L (ref 135–145)

## 2020-03-20 LAB — T3, FREE: T3, Free: 3.7 pg/mL (ref 2.0–4.4)

## 2020-03-20 LAB — MAGNESIUM: Magnesium: 2.1 mg/dL (ref 1.7–2.4)

## 2020-03-20 MED ORDER — METOPROLOL SUCCINATE ER 50 MG PO TB24
50.0000 mg | ORAL_TABLET | Freq: Every day | ORAL | 0 refills | Status: DC
Start: 2020-03-21 — End: 2020-04-13

## 2020-03-20 MED ORDER — APIXABAN 5 MG PO TABS
5.0000 mg | ORAL_TABLET | Freq: Two times a day (BID) | ORAL | 0 refills | Status: DC
Start: 2020-03-20 — End: 2020-04-13

## 2020-03-20 MED ORDER — DILTIAZEM HCL ER COATED BEADS 240 MG PO CP24
240.0000 mg | ORAL_CAPSULE | Freq: Every day | ORAL | 0 refills | Status: DC
Start: 2020-03-21 — End: 2020-04-13

## 2020-03-20 NOTE — Discharge Summary (Signed)
Physician Discharge Summary  Regina Taylor:[id number] DOB: July 19, 1927 DOA: 03/17/2020  PCP: Merri Brunette, MD  Admit date: 03/17/2020 Discharge date: 03/20/2020  Admitted From: home Disposition:  home  Recommendations for Outpatient Follow-up:  -Follow-up with PCP in 1 week Follow-up with cardiology in 1 to 2 weeks Repeat BMP and magnesium level on follow-up visit Take Eliquis and Cardizem as prescribed.  Continue same dose of metoprolol.  Home Health: None Equipment/Devices: None Discharge Condition: Stable CODE STATUS: Full code Diet recommendation: Cardiac healthy/low-sodium diet  Brief/Interim Summary: Patient is 86 year old female with past medical history of palpitations and SVT, GERD, glaucoma presents to emergency department for shortness of breath and orthopnea since few days.  She went to see her PCP and was found to have A. fib with elevated heart rate in 120s therefore she sent to ER for further evaluation and management.  Reports chronic intermittent nonbloody diarrhea since summer.  ED course: Patient received metoprolol and started on Cardizem drip for new onset A. fib with RVR.  Magnesium: Very low.  BNP: 417, initial troponin: 12, TSH: WNL, chest x-ray showed cardiomegaly with diffuse bilateral interstitial prominence.  Magnesium was replenished in ED.  Patient admitted for further evaluation and management of new onset A. fib with RVR and CHF.  New onset A. fib with RVR: -Troponin: Negative.  TSH: WNL -Initially patient started on IV Cardizem-switched to p.o. Cardizem 240 mg daily -Echo showed ejection fraction of 60 to 65%, moderate LVH, diastolic function could not be evaluated.  Mild MR, AR, mild aortic valve sclerosis.  Mild pulmonary hypertension, severe LAE. -Cardiology was consulted.  CHA2DS2-VASc score: 4.  Risk and benefits were discussed and patient started on Eliquis 5 mg twice daily. -Continued home dose of Toprol.  Patient's symptoms improved  significantly and and she discharged home on Toprol XL 50 mg daily, diltiazem to 40 mg daily and Eliquis 5 mg twice daily.  New onset diastolic CHF:  -BNP elevated at 417.    Chest x-ray shows cardiomegaly with diffuse bilateral interstitial prominence.  TSH resulted normal. -Strict INO's and daily weight.  Monitored electrolytes.  Magnesium replenished.  Patient symptoms improved  SVT/palpitation:  Remained stable on metoprolol and Cardizem  chronic intermittent diarrhea: Unknown etiology -TSH resulted WNL.  Less likely infectious.  Patient remained afebrile with no leukocytosis.  No abdominal tenderness.  No vomiting -Encourage increased p.o. intake -As needed Imodium.  Symptoms improved on the day of discharge.  Hyponatremia: Sodium 131-improved to 135 at the time of discharge. -Patient remained asymptomatic  Hypomagnesemia: Replenished and resolved at the time of discharge.  Discharge Diagnoses:  New onset A. fib with RVR New onset acute diastolic CHF SVT/palpitation Chronic intermittent diarrhea Hyponatremia Hypomagnesemia  Discharge Instructions  Discharge Instructions    Amb referral to [facility]   Complete by: As directed    Diet - low sodium heart healthy   Complete by: As directed    Discharge instructions   Complete by: As directed    Follow-up with PCP in 1 week Follow-up with cardiology in 1 week Check BMP and magnesium level on follow-up visit Take Eliquis 5 mg twice daily as prescribed Take Cardizem 240 mg daily and continue the same dose of metoprolol 50 mg daily   Increase activity slowly   Complete by: As directed      Allergies as of 03/20/2020      Reactions   Iodinated Diagnostic Agents Nausea And Vomiting   Xalatan    Vision problems  Medication List    TAKE these medications   apixaban 5 MG Tabs tablet Commonly known as: ELIQUIS Take 1 tablet (5 mg total) by mouth 2 (two) times daily.   BION TEARS OP Apply 1 drop to eye  daily as needed (dry eyes).   diltiazem 240 MG 24 hr capsule Commonly known as: CARDIZEM CD Take 1 capsule (240 mg total) by mouth daily. Start taking on: March 21, 2020   fish oil-omega-3 fatty acids 1000 MG capsule Take 1 g by mouth daily.   ICAPS PO Take 1 capsule by mouth daily.   metoprolol succinate 50 MG 24 hr tablet Commonly known as: TOPROL-XL Take 1 tablet (50 mg total) by mouth daily. Take with or immediately following a meal. Start taking on: March 21, 2020 What changed:   when to take this  additional instructions   multivitamin tablet Take 1 tablet by mouth daily.   RED YEAST RICE PO Take 1 tablet by mouth daily.   Systane Overnight Therapy 0.3 % Gel ophthalmic ointment Generic drug: hypromellose Apply 1 drop to eye at bedtime.   Timoptic Ocudose 0.5 % Soln Generic drug: Timolol Maleate PF Place 1 drop into the right eye daily.       Allergies  Allergen Reactions  . Iodinated Diagnostic Agents Nausea And Vomiting  . Xalatan     Vision problems    Consultations:  Cardiology   Procedures/Studies: DG Chest Port 1 View  Result Date: 03/17/2020 CLINICAL DATA:  Shortness of breath.  Cough. EXAM: PORTABLE CHEST 1 VIEW COMPARISON:  No prior. FINDINGS: Cardiomegaly. No pulmonary venous congestion. Diffuse bilateral interstitial prominence. Interstitial edema and/or pneumonitis could present this fashion. Tiny bilateral pleural effusions can not be excluded. No pneumothorax. Thoracic spine scoliosis. IMPRESSION: 1. Cardiomegaly. No pulmonary venous congestion. 2. Diffuse bilateral interstitial prominence. Interstitial edema and/or pneumonitis could present in this fashion. Electronically Signed   By: Maisie Fus  Register   On: 03/17/2020 14:23   ECHOCARDIOGRAM COMPLETE  Result Date: 03/18/2020    ECHOCARDIOGRAM REPORT   Patient Name:   Regina Taylor Date of Exam: 03/18/2020 Medical Rec #:  [id number]       Height:       65.0 in Accession #:     [id number]      Weight:       171.9 lb Date of Birth:  July 17, 1927        BSA:          1.855 m Patient Age:    86 years        BP:           116/64 mmHg Patient Gender: F               HR:           100 bpm. Exam Location:  Inpatient Procedure: 2D Echo, Cardiac Doppler and Color Doppler Indications:     Congestive Heart Failure  History:         Patient has prior history of Echocardiogram examinations, most                  recent 01/26/2014. Arrythmias:Atrial Fibrillation; Risk                  Factors:Former Smoker. MVP. DOE.  Sonographer:     Ross Ludwig RDCS (AE) Referring Phys:  [id number] Gertha Calkin Diagnosing Phys: Lennie Odor MD IMPRESSIONS  1. Left ventricular ejection fraction, by estimation, is 60 to 65%. The  left ventricle has normal function. The left ventricle has no regional wall motion abnormalities. There is moderate concentric left ventricular hypertrophy. Left ventricular diastolic function could not be evaluated.  2. Right ventricular systolic function is normal. The right ventricular size is normal. There is moderately elevated pulmonary artery systolic pressure. The estimated right ventricular systolic pressure is 45.2 mmHg.  3. Left atrial size was severely dilated.  4. Right atrial size was mildly dilated.  5. The mitral valve is degenerative. Trivial mitral valve regurgitation. No evidence of mitral stenosis.  6. The aortic valve is tricuspid. There is mild calcification of the aortic valve. There is mild thickening of the aortic valve. Aortic valve regurgitation is mild. Mild aortic valve sclerosis is present, with no evidence of aortic valve stenosis.  7. The inferior vena cava is dilated in size with <50% respiratory variability, suggesting right atrial pressure of 15 mmHg. Comparison(s): Changes from prior study are noted. In Afib with RVR. EF remains the same. RVSP ~45 mmHG on this study. FINDINGS  Left Ventricle: Left ventricular ejection fraction, by estimation, is 60 to 65%. The left  ventricle has normal function. The left ventricle has no regional wall motion abnormalities. The left ventricular internal cavity size was normal in size. There is  moderate concentric left ventricular hypertrophy. Left ventricular diastolic function could not be evaluated due to atrial fibrillation. Left ventricular diastolic function could not be evaluated. Right Ventricle: The right ventricular size is normal. No increase in right ventricular wall thickness. Right ventricular systolic function is normal. There is moderately elevated pulmonary artery systolic pressure. The tricuspid regurgitant velocity is 2.75 m/s, and with an assumed right atrial pressure of 15 mmHg, the estimated right ventricular systolic pressure is 45.2 mmHg. Left Atrium: Left atrial size was severely dilated. Right Atrium: Right atrial size was mildly dilated. Pericardium: Trivial pericardial effusion is present. Presence of pericardial fat pad. Mitral Valve: The mitral valve is degenerative in appearance. Mild to moderate mitral annular calcification. Trivial mitral valve regurgitation. No evidence of mitral valve stenosis. Tricuspid Valve: The tricuspid valve is grossly normal. Tricuspid valve regurgitation is mild . No evidence of tricuspid stenosis. Aortic Valve: The aortic valve is tricuspid. There is mild calcification of the aortic valve. There is mild thickening of the aortic valve. Aortic valve regurgitation is mild. Aortic regurgitation PHT measures 716 msec. Mild aortic valve sclerosis is present, with no evidence of aortic valve stenosis. Aortic valve mean gradient measures 6.0 mmHg. Aortic valve peak gradient measures 10.1 mmHg. Aortic valve area, by VTI measures 1.64 cm. Pulmonic Valve: The pulmonic valve was grossly normal. Pulmonic valve regurgitation is not visualized. No evidence of pulmonic stenosis. Aorta: The aortic root is normal in size and structure. Venous: The inferior vena cava is dilated in size with less than  50% respiratory variability, suggesting right atrial pressure of 15 mmHg. IAS/Shunts: The atrial septum is grossly normal. Additional Comments: There is a small pleural effusion in the left lateral region.  LEFT VENTRICLE PLAX 2D LVIDd:         3.80 cm LVIDs:         2.40 cm LV PW:         1.48 cm LV IVS:        1.50 cm LVOT diam:     1.90 cm LV SV:         55 LV SV Index:   30 LVOT Area:     2.84 cm  RIGHT VENTRICLE  IVC RV Basal diam:  3.20 cm    IVC diam: 2.40 cm RV S prime:     9.45 cm/s TAPSE (M-mode): 2.3 cm LEFT ATRIUM              Index       RIGHT ATRIUM           Index LA diam:        4.00 cm  2.16 cm/m  RA Area:     21.30 cm LA Vol (A2C):   103.0 ml 55.53 ml/m RA Volume:   62.00 ml  33.43 ml/m LA Vol (A4C):   96.3 ml  51.92 ml/m LA Biplane Vol: 100.0 ml 53.91 ml/m  AORTIC VALVE AV Area (Vmax):    2.00 cm AV Area (Vmean):   1.70 cm AV Area (VTI):     1.64 cm AV Vmax:           159.00 cm/s AV Vmean:          116.000 cm/s AV VTI:            0.334 m AV Peak Grad:      10.1 mmHg AV Mean Grad:      6.0 mmHg LVOT Vmax:         112.00 cm/s LVOT Vmean:        69.500 cm/s LVOT VTI:          0.193 m LVOT/AV VTI ratio: 0.58 AI PHT:            716 msec  AORTA Ao Root diam: 3.20 cm TRICUSPID VALVE TR Peak grad:   30.2 mmHg TR Vmax:        275.00 cm/s  SHUNTS Systemic VTI:  0.19 m Systemic Diam: 1.90 cm Lennie Odor MD Electronically signed by Lennie Odor MD Signature Date/Time: 03/18/2020/2:39:14 PM    Final (Updated)        Subjective: Patient seen and examined.  Sitting comfortably on the chair.  Tells me that magnesium helps her symptoms.  Denies chest pain, shortness of breath, palpitation or leg swelling.  Wishes to go home today. Discharge Exam: Vitals:   03/20/20 0659 03/20/20 0748  BP: 110/79 103/74  Pulse: 90 (!) 103  Resp: 20 16  Temp: 97.7 F (36.5 C) (!) 97.5 F (36.4 C)  SpO2:  96%   Vitals:   03/19/20 0740 03/19/20 2101 03/20/20 0659 03/20/20 0748  BP: (!)  132/97  110/79 103/74  Pulse: 100 94 90 (!) 103  Resp: 20 20 20 16   Temp: 97.6 F (36.4 C) (!) 97.5 F (36.4 C) 97.7 F (36.5 C) (!) 97.5 F (36.4 C)  TempSrc: Oral Oral Oral Oral  SpO2: 98%   96%  Weight:   77.7 kg   Height:        General: Pt is alert, awake, not in acute distress Cardiovascular: RRR, S1/S2 +, no rubs, no gallops Respiratory: CTA bilaterally, no wheezing, no rhonchi Abdominal: Soft, NT, ND, bowel sounds + Extremities: no edema, no cyanosis    The results of significant diagnostics from this hospitalization (including imaging, microbiology, ancillary and laboratory) are listed below for reference.     Microbiology: Recent Results (from the past 240 hour(s))  Respiratory Panel by RT PCR (Flu A&B, Covid) - Nasopharyngeal Swab     Status: None   Collection Time: 03/17/20  2:41 PM   Specimen: Nasopharyngeal Swab  Result Value Ref Range Status   SARS Coronavirus 2 by RT PCR NEGATIVE NEGATIVE Final  Comment: (NOTE) SARS-CoV-2 target nucleic acids are NOT DETECTED.  The SARS-CoV-2 RNA is generally detectable in upper respiratoy specimens during the acute phase of infection. The lowest concentration of SARS-CoV-2 viral copies this assay can detect is 131 copies/mL. A negative result does not preclude SARS-Cov-2 infection and should not be used as the sole basis for treatment or other patient management decisions. A negative result may occur with  improper specimen collection/handling, submission of specimen other than nasopharyngeal swab, presence of viral mutation(s) within the areas targeted by this assay, and inadequate number of viral copies (<131 copies/mL). A negative result must be combined with clinical observations, patient history, and epidemiological information. The expected result is Negative.  Fact Sheet for Patients:  https://www.moore.com/  Fact Sheet for Healthcare Providers:   https://www.young.biz/  This test is no t yet approved or cleared by the [place] FDA and  has been authorized for detection and/or diagnosis of SARS-CoV-2 by FDA under an Emergency Use Authorization (EUA). This EUA will remain  in effect (meaning this test can be used) for the duration of the COVID-19 declaration under Section 564(b)(1) of the Act, 21 U.S.C. section 360bbb-3(b)(1), unless the authorization is terminated or revoked sooner.     Influenza A by PCR NEGATIVE NEGATIVE Final   Influenza B by PCR NEGATIVE NEGATIVE Final    Comment: (NOTE) The Xpert Xpress SARS-CoV-2/FLU/RSV assay is intended as an aid in  the diagnosis of influenza from Nasopharyngeal swab specimens and  should not be used as a sole basis for treatment. Nasal washings and  aspirates are unacceptable for Xpert Xpress SARS-CoV-2/FLU/RSV  testing.  Fact Sheet for Patients: https://www.moore.com/  Fact Sheet for Healthcare Providers: https://www.young.biz/  This test is not yet approved or cleared by the [place] FDA and  has been authorized for detection and/or diagnosis of SARS-CoV-2 by  FDA under an Emergency Use Authorization (EUA). This EUA will remain  in effect (meaning this test can be used) for the duration of the  Covid-19 declaration under Section 564(b)(1) of the Act, 21  U.S.C. section 360bbb-3(b)(1), unless the authorization is  terminated or revoked. Performed at [facility], [address], [place], [place] [address]      Labs: BNP (last 3 results) Recent Labs    03/17/20 1444  BNP 417.4*   Basic Metabolic Panel: Recent Labs  Lab 03/17/20 1444 03/18/20 0206 03/19/20 0416 03/20/20 0445  NA 136 134* 131* 135  K 4.3 3.9 3.9 3.7  CL 103 103 99 105  CO2 24 22 19* 22  GLUCOSE 113* 113* 119* 101*  BUN 10 12 11 9   CREATININE 0.67 0.63 0.61 0.61  CALCIUM 9.3 8.9 8.9 9.0  MG <0.1* 2.1 1.8 2.1  PHOS  --   3.1  --   --    Liver Function Tests: No results for input(s): AST, ALT, ALKPHOS, BILITOT, PROT, ALBUMIN in the last 168 hours. No results for input(s): LIPASE, AMYLASE in the last 168 hours. No results for input(s): AMMONIA in the last 168 hours. CBC: Recent Labs  Lab 03/17/20 1444 03/18/20 0206  WBC 5.4 6.2  NEUTROABS 3.4  --   HGB 13.2 12.5  HCT 40.0 37.4  MCV 93.2 91.0  PLT 190 172   Cardiac Enzymes: No results for input(s): CKTOTAL, CKMB, CKMBINDEX, TROPONINI in the last 168 hours. BNP: Invalid input(s): POCBNP CBG: No results for input(s): GLUCAP in the last 168 hours. D-Dimer No results for input(s): DDIMER in the last 72 hours.  Hgb A1c No results for input(s): HGBA1C in the last 72 hours. Lipid Profile No results for input(s): CHOL, HDL, LDLCALC, TRIG, CHOLHDL, LDLDIRECT in the last 72 hours. Thyroid function studies Recent Labs    03/17/20 1420 03/18/20 1744  TSH 1.341  --   T3FREE  --  3.7   Anemia work up No results for input(s): VITAMINB12, FOLATE, FERRITIN, TIBC, IRON, RETICCTPCT in the last 72 hours. Urinalysis    Component Value Date/Time   COLORURINE YELLOW 01/26/2014 0929   APPEARANCEUR CLEAR 01/26/2014 0929   LABSPEC 1.020 01/26/2014 0929   PHURINE 6.0 01/26/2014 0929   GLUCOSEU NEGATIVE 01/26/2014 0929   HGBUR NEGATIVE 01/26/2014 0929   BILIRUBINUR NEGATIVE 01/26/2014 0929   KETONESUR NEGATIVE 01/26/2014 0929   PROTEINUR NEGATIVE 01/26/2014 0929   UROBILINOGEN 0.2 01/26/2014 0929   NITRITE NEGATIVE 01/26/2014 0929   LEUKOCYTESUR SMALL (A) 01/26/2014 0929   Sepsis Labs Invalid input(s): PROCALCITONIN,  WBC,  LACTICIDVEN Microbiology Recent Results (from the past 240 hour(s))  Respiratory Panel by RT PCR (Flu A&B, Covid) - Nasopharyngeal Swab     Status: None   Collection Time: 03/17/20  2:41 PM   Specimen: Nasopharyngeal Swab  Result Value Ref Range Status   SARS Coronavirus 2 by RT PCR NEGATIVE NEGATIVE Final    Comment:  (NOTE) SARS-CoV-2 target nucleic acids are NOT DETECTED.  The SARS-CoV-2 RNA is generally detectable in upper respiratoy specimens during the acute phase of infection. The lowest concentration of SARS-CoV-2 viral copies this assay can detect is 131 copies/mL. A negative result does not preclude SARS-Cov-2 infection and should not be used as the sole basis for treatment or other patient management decisions. A negative result may occur with  improper specimen collection/handling, submission of specimen other than nasopharyngeal swab, presence of viral mutation(s) within the areas targeted by this assay, and inadequate number of viral copies (<131 copies/mL). A negative result must be combined with clinical observations, patient history, and epidemiological information. The expected result is Negative.  Fact Sheet for Patients:  https://www.moore.com/  Fact Sheet for Healthcare Providers:  https://www.young.biz/  This test is no t yet approved or cleared by the [place] FDA and  has been authorized for detection and/or diagnosis of SARS-CoV-2 by FDA under an Emergency Use Authorization (EUA). This EUA will remain  in effect (meaning this test can be used) for the duration of the COVID-19 declaration under Section 564(b)(1) of the Act, 21 U.S.C. section 360bbb-3(b)(1), unless the authorization is terminated or revoked sooner.     Influenza A by PCR NEGATIVE NEGATIVE Final   Influenza B by PCR NEGATIVE NEGATIVE Final    Comment: (NOTE) The Xpert Xpress SARS-CoV-2/FLU/RSV assay is intended as an aid in  the diagnosis of influenza from Nasopharyngeal swab specimens and  should not be used as a sole basis for treatment. Nasal washings and  aspirates are unacceptable for Xpert Xpress SARS-CoV-2/FLU/RSV  testing.  Fact Sheet for Patients: https://www.moore.com/  Fact Sheet for Healthcare  Providers: https://www.young.biz/  This test is not yet approved or cleared by the [place] FDA and  has been authorized for detection and/or diagnosis of SARS-CoV-2 by  FDA under an Emergency Use Authorization (EUA). This EUA will remain  in effect (meaning this test can be used) for the duration of the  Covid-19 declaration under Section 564(b)(1) of the Act, 21  U.S.C. section 360bbb-3(b)(1), unless the authorization is  terminated or revoked. Performed at [facility], [address] [place], [place]  [address]      Time coordinating discharge: Over 30 minutes  SIGNED:   Ollen Bowl, MD  [facility] 03/20/2020, 11:48 AM Pager   If 7PM-7AM, please contact night-coverage [email or web address]

## 2020-03-20 NOTE — Plan of Care (Signed)

## 2020-03-20 NOTE — Progress Notes (Signed)
Progress Note  Patient Name: Regina Taylor Date of Encounter: 03/20/2020  Primary Cardiologist: Dr. Swaziland  Subjective   Feels improved following magnesium replacement;  Inpatient Medications    Scheduled Meds: . apixaban  5 mg Oral BID  . diltiazem  240 mg Oral Daily  . metoprolol succinate  50 mg Oral Daily   Continuous Infusions:  PRN Meds: acetaminophen, ondansetron (ZOFRAN) IV   Vital Signs    Vitals:   03/19/20 0740 03/19/20 2101 03/20/20 0659 03/20/20 0748  BP: (!) 132/97  110/79 103/74  Pulse: 100 94 90 (!) 103  Resp: 20 20 20 16   Temp: 97.6 F (36.4 C) (!) 97.5 F (36.4 C) 97.7 F (36.5 C) (!) 97.5 F (36.4 C)  TempSrc: Oral Oral Oral Oral  SpO2: 98%   96%  Weight:   77.7 kg   Height:        Intake/Output Summary (Last 24 hours) at 03/20/2020 1103 Last data filed at 03/20/2020 0600 Gross per 24 hour  Intake --  Output 1 ml  Net -1 ml    I/O since admission: -65  Filed Weights   03/18/20 0434 03/19/20 0311 03/20/20 0659  Weight: 78 kg 78.2 kg 77.7 kg    Telemetry    AF rate slightly improved from yesterday now in the 90s to 105 range- Personally Reviewed  ECG    ECG (independently read by me): Atrial fibrillation at 116  Physical Exam   BP 103/74 (BP Location: Right Arm)   Pulse (!) 103   Temp (!) 97.5 F (36.4 C) (Oral)   Resp 16   Ht 5\' 5"  (1.651 m)   Wt 77.7 kg   SpO2 96%   BMI 28.52 kg/m  General: Alert, oriented, no distress.  Skin: normal turgor, no rashes, warm and dry HEENT: Normocephalic, atraumatic. Pupils equal round and reactive to light; sclera anicteric; extraocular muscles intact; Nose without nasal septal hypertrophy Mouth/Parynx benign; Mallinpatti scale Neck: No JVD, no carotid bruits; normal carotid upstroke Lungs: clear to ausculatation and percussion; no wheezing or rales Chest wall: without tenderness to palpitation Heart: PMI not displaced, irregular regular with heart rate approximately rate  improved now in the 90s to low 100s, s1 s2 normal, 1/6 systolic murmur, no diastolic murmur, no rubs, gallops, thrills, or heaves Abdomen: soft, nontender; no hepatosplenomehaly, BS+; abdominal aorta nontender and not dilated by palpation. Back: no CVA tenderness Pulses 2+ Musculoskeletal: full range of motion, normal strength, no joint deformities Extremities: no clubbing cyanosis or edema, Homan's sign negative  Neurologic: grossly nonfocal; Cranial nerves grossly wnl Psychologic: Normal mood and affect   Labs    Chemistry Recent Labs  Lab 03/18/20 0206 03/19/20 0416 03/20/20 0445  NA 134* 131* 135  K 3.9 3.9 3.7  CL 103 99 105  CO2 22 19* 22  GLUCOSE 113* 119* 101*  BUN 12 11 9   CREATININE 0.63 0.61 0.61  CALCIUM 8.9 8.9 9.0  GFRNONAA >60 >60 >60  ANIONGAP 9 13 8      Hematology Recent Labs  Lab 03/17/20 1444 03/18/20 0206  WBC 5.4 6.2  RBC 4.29 4.11  HGB 13.2 12.5  HCT 40.0 37.4  MCV 93.2 91.0  MCH 30.8 30.4  MCHC 33.0 33.4  RDW 13.1 13.0  PLT 190 172    Cardiac EnzymesNo results for input(s): TROPONINI in the last 168 hours. No results for input(s): TROPIPOC in the last 168 hours.   BNP Recent Labs  Lab 03/17/20 1444  BNP 417.4*  DDimer No results for input(s): DDIMER in the last 168 hours.   Lipid Panel     Component Value Date/Time   CHOL 147 01/25/2014 2340   TRIG 63 01/25/2014 2340   HDL 47 01/25/2014 2340   CHOLHDL 3.1 01/25/2014 2340   VLDL 13 01/25/2014 2340   LDLCALC 87 01/25/2014 2340     Radiology    ECHOCARDIOGRAM COMPLETE  Result Date: 03/18/2020    ECHOCARDIOGRAM REPORT   Patient Name:   Regina Taylor Date of Exam: 03/18/2020 Medical Rec #:  [id number]       Height:       65.0 in Accession #:    [id number]      Weight:       171.9 lb Date of Birth:  July 20, 1927        BSA:          1.855 m Patient Age:    86 years        BP:           116/64 mmHg Patient Gender: F               HR:           100 bpm. Exam Location:  Inpatient  Procedure: 2D Echo, Cardiac Doppler and Color Doppler Indications:     Congestive Heart Failure  History:         Patient has prior history of Echocardiogram examinations, most                  recent 01/26/2014. Arrythmias:Atrial Fibrillation; Risk                  Factors:Former Smoker. MVP. DOE.  Sonographer:     Ross Ludwig RDCS (AE) Referring Phys:  [id number] Gertha Calkin Diagnosing Phys: Lennie Odor MD IMPRESSIONS  1. Left ventricular ejection fraction, by estimation, is 60 to 65%. The left ventricle has normal function. The left ventricle has no regional wall motion abnormalities. There is moderate concentric left ventricular hypertrophy. Left ventricular diastolic function could not be evaluated.  2. Right ventricular systolic function is normal. The right ventricular size is normal. There is moderately elevated pulmonary artery systolic pressure. The estimated right ventricular systolic pressure is 45.2 mmHg.  3. Left atrial size was severely dilated.  4. Right atrial size was mildly dilated.  5. The mitral valve is degenerative. Trivial mitral valve regurgitation. No evidence of mitral stenosis.  6. The aortic valve is tricuspid. There is mild calcification of the aortic valve. There is mild thickening of the aortic valve. Aortic valve regurgitation is mild. Mild aortic valve sclerosis is present, with no evidence of aortic valve stenosis.  7. The inferior vena cava is dilated in size with <50% respiratory variability, suggesting right atrial pressure of 15 mmHg. Comparison(s): Changes from prior study are noted. In Afib with RVR. EF remains the same. RVSP ~45 mmHG on this study. FINDINGS  Left Ventricle: Left ventricular ejection fraction, by estimation, is 60 to 65%. The left ventricle has normal function. The left ventricle has no regional wall motion abnormalities. The left ventricular internal cavity size was normal in size. There is  moderate concentric left ventricular hypertrophy. Left ventricular  diastolic function could not be evaluated due to atrial fibrillation. Left ventricular diastolic function could not be evaluated. Right Ventricle: The right ventricular size is normal. No increase in right ventricular wall thickness. Right ventricular systolic function is normal. There is moderately elevated pulmonary artery systolic  pressure. The tricuspid regurgitant velocity is 2.75 m/s, and with an assumed right atrial pressure of 15 mmHg, the estimated right ventricular systolic pressure is 45.2 mmHg. Left Atrium: Left atrial size was severely dilated. Right Atrium: Right atrial size was mildly dilated. Pericardium: Trivial pericardial effusion is present. Presence of pericardial fat pad. Mitral Valve: The mitral valve is degenerative in appearance. Mild to moderate mitral annular calcification. Trivial mitral valve regurgitation. No evidence of mitral valve stenosis. Tricuspid Valve: The tricuspid valve is grossly normal. Tricuspid valve regurgitation is mild . No evidence of tricuspid stenosis. Aortic Valve: The aortic valve is tricuspid. There is mild calcification of the aortic valve. There is mild thickening of the aortic valve. Aortic valve regurgitation is mild. Aortic regurgitation PHT measures 716 msec. Mild aortic valve sclerosis is present, with no evidence of aortic valve stenosis. Aortic valve mean gradient measures 6.0 mmHg. Aortic valve peak gradient measures 10.1 mmHg. Aortic valve area, by VTI measures 1.64 cm. Pulmonic Valve: The pulmonic valve was grossly normal. Pulmonic valve regurgitation is not visualized. No evidence of pulmonic stenosis. Aorta: The aortic root is normal in size and structure. Venous: The inferior vena cava is dilated in size with less than 50% respiratory variability, suggesting right atrial pressure of 15 mmHg. IAS/Shunts: The atrial septum is grossly normal. Additional Comments: There is a small pleural effusion in the left lateral region.  LEFT VENTRICLE PLAX 2D  LVIDd:         3.80 cm LVIDs:         2.40 cm LV PW:         1.48 cm LV IVS:        1.50 cm LVOT diam:     1.90 cm LV SV:         55 LV SV Index:   30 LVOT Area:     2.84 cm  RIGHT VENTRICLE            IVC RV Basal diam:  3.20 cm    IVC diam: 2.40 cm RV S prime:     9.45 cm/s TAPSE (M-mode): 2.3 cm LEFT ATRIUM              Index       RIGHT ATRIUM           Index LA diam:        4.00 cm  2.16 cm/m  RA Area:     21.30 cm LA Vol (A2C):   103.0 ml 55.53 ml/m RA Volume:   62.00 ml  33.43 ml/m LA Vol (A4C):   96.3 ml  51.92 ml/m LA Biplane Vol: 100.0 ml 53.91 ml/m  AORTIC VALVE AV Area (Vmax):    2.00 cm AV Area (Vmean):   1.70 cm AV Area (VTI):     1.64 cm AV Vmax:           159.00 cm/s AV Vmean:          116.000 cm/s AV VTI:            0.334 m AV Peak Grad:      10.1 mmHg AV Mean Grad:      6.0 mmHg LVOT Vmax:         112.00 cm/s LVOT Vmean:        69.500 cm/s LVOT VTI:          0.193 m LVOT/AV VTI ratio: 0.58 AI PHT:            716 msec  AORTA Ao Root diam: 3.20 cm TRICUSPID VALVE TR Peak grad:   30.2 mmHg TR Vmax:        275.00 cm/s  SHUNTS Systemic VTI:  0.19 m Systemic Diam: 1.90 cm Lennie Odor MD Electronically signed by Lennie Odor MD Signature Date/Time: 03/18/2020/2:39:14 PM    Final (Updated)     Cardiac Studies   Echo 03/18/2020 1. Left ventricular ejection fraction, by estimation, is 60 to 65%. The  left ventricle has normal function. The left ventricle has no regional  wall motion abnormalities. There is moderate concentric left ventricular  hypertrophy. Left ventricular  diastolic function could not be evaluated.  2. Right ventricular systolic function is normal. The right ventricular  size is normal. There is moderately elevated pulmonary artery systolic  pressure. The estimated right ventricular systolic pressure is 45.2 mmHg.  3. Left atrial size was severely dilated.  4. Right atrial size was mildly dilated.  5. The mitral valve is degenerative. Trivial mitral valve  regurgitation.  No evidence of mitral stenosis.  6. The aortic valve is tricuspid. There is mild calcification of the  aortic valve. There is mild thickening of the aortic valve. Aortic valve  regurgitation is mild. Mild aortic valve sclerosis is present, with no  evidence of aortic valve stenosis.  7. The inferior vena cava is dilated in size with <50% respiratory  variability, suggesting right atrial pressure of 15 mmHg.    Patient Profile     Regina Taylor is a 86 y.o. 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 Jacqulyn Bath.  Assessment & Plan    1.  AF with RVR: Heart rate improved today Cardizem CD 240 mg was initiated yesterday and the patient continues to be metoprolol succinate 50 mg.  Ventricular rate now in the 90s to low 100s.  Okay for discharge.  Anticipate therapeutic Cardizem level by midweek.  Patient is on anticoagulation with Eliquis.  Echo Doppler study has shown severe left atrial dilatation with mild right atrial dilatation.  CHA2DS2-VASc 2 score is 4.  No bleeding on Eliquis  2.  Probable diastolic heart failure; diastolic function not able to be evaluated echo. BNP 417  3.  History of SVT.  The patient on metoprolol succinate 50 mg daily.  If heart rate continues to be increased despite the addition of Cardizem CD 240 mg, slight titration of beta-blocker may be necessary.  4 Hypomagnesemia: Status post repletion  Patient is stable.  Okay for discharge today with plans for follow-up with Dr. Swaziland in our office.  Signed, Lennette Bihari, MD, [facility] 03/20/2020, 11:03 AM

## 2020-03-30 ENCOUNTER — Telehealth: Payer: Self-pay | Admitting: *Deleted

## 2020-03-30 ENCOUNTER — Ambulatory Visit: Payer: Medicare PPO | Admitting: Internal Medicine

## 2020-03-30 NOTE — Telephone Encounter (Signed)
-----   Message from Marcelino Duster, [name] sent at 03/21/2020  6:40 AM EDT -----  In 7-10 days. ----- Message ----- From: Jacinta Shoe Sent: 03/20/2020  12:57 PM EDT To: Jaynie Bream, PA-C  Can you please arrange a TOC follow-up with Dr. Swaziland or team for Ms. Strohm? She may go home on Sunday, so see if she is still in the hospital.  Otherwise, someone will have to call her with the appointment.Thanks

## 2020-03-30 NOTE — Progress Notes (Signed)
Cardiology Clinic Note   Patient Name: Regina Taylor Date of Encounter: 03/31/2020  Primary Care Provider:  Merri Brunette, MD Primary Cardiologist:  Regina Swaziland, MD  Patient Profile    Regina Taylor 86 year old female presents to the clinic today for follow-up evaluation of her atrial fibrillation shortness of breath.  Past Medical History    Past Medical History:  Diagnosis Date  . Arthritis   . Blindness of left eye   . Fatigue   . GERD (gastroesophageal reflux disease)   . Glaucoma   . Macular degeneration   . MVP (mitral valve prolapse)   . Palpitations   . SVT (supraventricular tachycardia) (HCC)    Past Surgical History:  Procedure Laterality Date  . CATARAC    . CATARACT EXTRACTION    . PARTIAL HIP ARTHROPLASTY    . RETINAL DETACHMENT SURGERY    . TOTAL HIP ARTHROPLASTY Right 1990    Allergies  Allergies  Allergen Reactions  . Iodinated Diagnostic Agents Nausea And Vomiting  . Latanoprost Other (See Comments)    Vision problems  . Xalatan     Vision problems    History of Present Illness    Ms. Regina Taylor has a PMH of palpitations, SVT, GERD, atrial fibrillation, hyponatremia, diastolic CHF, and glaucoma.  She was recently admitted to the hospital 03/17/2020 until 03/20/2020.  She presented to the emergency department with shortness of breath and orthopnea.  She reported this is been present for the past few days.  She presented to her PCP and was found to have A. fib with RVR.  She was diagnosed with new onset atrial fibrillation.  She reported chronic intermittent diarrhea since the summer.  She was placed on metoprolol and received Cardizem drip.  Her lab work showed low magnesium, troponin of 12, TSH was normal, CXR showed cardiomegaly with diffuse bilateral interstitial prominence.  She was given supplemental magnesium in the emergency department.  She was transitioned to p.o. Cardizem.  Her echocardiogram showed an EF of 60-65%, moderate LVH,  mild MR, AR, mild aortic valve sclerosis, mild pulmonary hypertension and severe LAE.  She was placed on Eliquis 5 mg twice daily, and her metoprolol was continued.  She was also diagnosed with new onset diastolic CHF.  She presents to the clinic today for follow-up evaluation with her son and states she feels well.  She continues to take care of her two-story house.  2 months ago she was exercising more regularly but had a fall.  That she had an admission to the hospital.  She has not resumed her previous exercise routine.  She states that she was somewhat hesitant to resume her exercise routine after her fall and hospital admission.  I reviewed her echocardiogram and EKG for today.  She denies bleeding issues.  I will order a BMP and magnesium level and have her follow-up in  3 months.  Today she denies chest pain, shortness of breath, lower extremity edema, fatigue, palpitations, melena, hematuria, hemoptysis, diaphoresis, weakness, presyncope, syncope, orthopnea, and PND.   Home Medications    Prior to Admission medications   Medication Sig Start Date End Date Taking? Authorizing Provider  apixaban (ELIQUIS) 5 MG TABS tablet Take 1 tablet (5 mg total) by mouth 2 (two) times daily. 03/20/20   Pahwani, Kasandra Knudsen, MD  Artificial Tear Solution (BION TEARS OP) Apply 1 drop to eye daily as needed (dry eyes).    [provider]  diltiazem (CARDIZEM CD) 240 MG 24 hr capsule  Take 1 capsule (240 mg total) by mouth daily. 03/21/20   Pahwani, Kasandra Knudseninka R, MD  fish oil-omega-3 fatty acids 1000 MG capsule Take 1 g by mouth daily.     [provider]  Hypromellose (SYSTANE OVERNIGHT THERAPY) 0.3 % GEL Apply 1 drop to eye at bedtime.    [provider]  metoprolol succinate (TOPROL-XL) 50 MG 24 hr tablet Take 1 tablet (50 mg total) by mouth daily. Take with or immediately following a meal. 03/21/20   Pahwani, Kasandra Knudseninka R, MD  Multiple Vitamin (MULTIVITAMIN) tablet Take 1 tablet by mouth daily.      [provider]  Multiple Vitamins-Minerals (ICAPS PO) Take 1 capsule by mouth daily.     [provider]  Red Yeast Rice Extract (RED YEAST RICE PO) Take 1 tablet by mouth daily.     [provider]  TIMOPTIC OCUDOSE 0.5 % SOLN Place 1 drop into the right eye daily.  11/28/13   [provider]    Family History    Family History  Problem Relation Age of Onset  . Cancer Mother   . Heart disease Father   . Heart attack Brother    She indicated that her mother is deceased. She indicated that her father is deceased. She indicated that her brother is deceased.  Social History    Social History   Socioeconomic History  . Marital status: Divorced    Spouse name: Not on file  . Number of children: 1  . Years of education: Not on file  . Highest education level: Not on file  Occupational History  . Occupation: foreign language professor    Employer: RETIRED    Comment: retired  Tobacco Use  . Smoking status: Former Smoker    Quit date: 09/07/1979    Years since quitting: 40.5  . Smokeless tobacco: Never Used  Substance and Sexual Activity  . Alcohol use: Yes    Comment: occaisional wine  . Drug use: No  . Sexual activity: Not on file  Other Topics Concern  . Not on file  Social History Narrative  . Not on file   Social Determinants of Health   Financial Resource Strain:   . Difficulty of Paying Living Expenses: Not on file  Food Insecurity:   . Worried About Programme researcher, broadcasting/film/videounning Out of Food in the Last Year: Not on file  . Ran Out of Food in the Last Year: Not on file  Transportation Needs:   . Lack of Transportation (Medical): Not on file  . Lack of Transportation (Non-Medical): Not on file  Physical Activity:   . Days of Exercise per Week: Not on file  . Minutes of Exercise per Session: Not on file  Stress:   . Feeling of Stress : Not on file  Social Connections:   . Frequency of Communication with Friends and Family: Not on file  .  Frequency of Social Gatherings with Friends and Family: Not on file  . Attends Religious Services: Not on file  . Active Member of Clubs or Organizations: Not on file  . Attends BankerClub or Organization Meetings: Not on file  . Marital Status: Not on file  Intimate Partner Violence:   . Fear of Current or Ex-Partner: Not on file  . Emotionally Abused: Not on file  . Physically Abused: Not on file  . Sexually Abused: Not on file     Review of Systems    General:  No chills, fever, night sweats or weight changes.  Cardiovascular:  No chest pain, dyspnea on exertion, edema, orthopnea, palpitations, paroxysmal nocturnal dyspnea. Dermatological: No rash, lesions/masses Respiratory: No cough, dyspnea Urologic: No hematuria, dysuria Abdominal:   No nausea, vomiting, diarrhea, bright red blood per rectum, melena, or hematemesis Neurologic:  No visual changes, wkns, changes in mental status. All other systems reviewed and are otherwise negative except as noted above.  Physical Exam    VS:  BP 128/68   Pulse 88   Ht 5' 4.5" (1.638 m)   Wt 170 lb (77.1 kg)   BMI 28.73 kg/m  , BMI Body mass index is 28.73 kg/m. GEN: Well nourished, well developed, in no acute distress. HEENT: normal. Neck: Supple, no JVD, carotid bruits, or masses. Cardiac: RRR, no murmurs, rubs, or gallops. No clubbing, cyanosis, edema.  Radials/DP/PT 2+ and equal bilaterally.  Respiratory:  Respirations regular and unlabored, clear to auscultation bilaterally. GI: Soft, nontender, nondistended, BS + x 4. MS: no deformity or atrophy. Skin: warm and dry, no rash. Neuro:  Strength and sensation are intact. Psych: Normal affect.  Accessory Clinical Findings    Recent Labs: 03/17/2020: B Natriuretic Peptide 417.4; TSH 1.341 03/18/2020: Hemoglobin 12.5; Platelets 172 03/20/2020: BUN 9; Creatinine, Ser 0.61; Magnesium 2.1; Potassium 3.7; Sodium 135   Recent Lipid Panel    Component Value Date/Time   CHOL 147  01/25/2014 2340   TRIG 63 01/25/2014 2340   HDL 47 01/25/2014 2340   CHOLHDL 3.1 01/25/2014 2340   VLDL 13 01/25/2014 2340   LDLCALC 87 01/25/2014 2340    ECG personally reviewed by me today-atrial fibrillation with competing junctional pacemaker 88 bpm  Echocardiogram 03/18/2020 1. Left ventricular ejection fraction, by estimation, is 60 to 65%. The  left ventricle has normal function. The left ventricle has no regional  wall motion abnormalities. There is moderate concentric left ventricular  hypertrophy. Left ventricular  diastolic function could not be evaluated.  2. Right ventricular systolic function is normal. The right ventricular  size is normal. There is moderately elevated pulmonary artery systolic  pressure. The estimated right ventricular systolic pressure is 45.2 mmHg.  3. Left atrial size was severely dilated.  4. Right atrial size was mildly dilated.  5. The mitral valve is degenerative. Trivial mitral valve regurgitation.  No evidence of mitral stenosis.  6. The aortic valve is tricuspid. There is mild calcification of the  aortic valve. There is mild thickening of the aortic valve. Aortic valve  regurgitation is mild. Mild aortic valve sclerosis is present, with no  evidence of aortic valve stenosis.  7. The inferior vena cava is dilated in size with <50% respiratory  variability, suggesting right atrial pressure of 15 mmHg.   Assessment & Plan   1.  New onset atrial fibrillation with RVR/history of SVT -EKG today shows atrial fibrillation with competing junctional pacemaker 88 bpm.  She had presented to her PCP with new onset shortness of breath and orthopnea.  She was found to be in A. fib RVR.  She was treated with metoprolol and Cardizem for rate control.  She was also started on apixaban during her admission.  Denies bleeding issues. Continue metoprolol, Cardizem, apixaban Heart healthy low-sodium diet-salty 6 given Increase physical activity as  tolerated Avoid triggers caffeine, chocolate, EtOH etc. Order BMP  Diastolic CHF-no increased activity intolerance or DOE today.  Feeling much better with her heart rate controlled and after receiving supplemental magnesium. Continue metoprolol, Cardizem Heart healthy low-sodium diet-salty 6 given Increase physical activity as tolerated  Hypomagnesemia-initial magnesium  was less than 0.1.  At discharge magnesium 2.1.  Reported chronic diarrhea since summer which is now resolved. Order magnesium lab   Disposition: Follow-up with Dr. Swaziland or me in 3 months.    Thomasene Ripple. Beatrice Sehgal NP-C    03/31/2020, 9:14 AM [facility] [address] Office [phone] Fax [phone]  Notice: This dictation was prepared with Dragon dictation along with smaller phrase technology. Any transcriptional errors that result from this process are unintentional and may not be corrected upon review.

## 2020-03-30 NOTE — Telephone Encounter (Signed)
Patient contacted regarding discharge from [facility] on 03/20/20.  Patient understands to follow up with provider jesse cleaver np on 03/31/20 at 8:45 am at [facility]. Patient understands discharge instructions? yes Patient understands medications and regiment? yes Patient understands to bring all medications to this visit? Yes   Pt was at office today Per pt had several phone calls re appt and there was confusion but is aware of tomorrows appt./cy

## 2020-03-31 ENCOUNTER — Other Ambulatory Visit: Payer: Self-pay

## 2020-03-31 ENCOUNTER — Ambulatory Visit: Payer: Medicare PPO | Admitting: General Practice

## 2020-03-31 ENCOUNTER — Encounter: Payer: Self-pay | Admitting: General Practice

## 2020-03-31 VITALS — BP 128/68 | HR 88 | Ht 64.5 in | Wt 170.0 lb

## 2020-03-31 DIAGNOSIS — I4891 Unspecified atrial fibrillation: Secondary | ICD-10-CM

## 2020-03-31 DIAGNOSIS — Z79899 Other long term (current) drug therapy: Secondary | ICD-10-CM

## 2020-03-31 DIAGNOSIS — I5031 Acute diastolic (congestive) heart failure: Secondary | ICD-10-CM

## 2020-03-31 LAB — BASIC METABOLIC PANEL
BUN/Creatinine Ratio: 20 (ref 12–28)
BUN: 16 mg/dL (ref 10–36)
CO2: 23 mmol/L (ref 20–29)
Calcium: 9.9 mg/dL (ref 8.7–10.3)
Chloride: 100 mmol/L (ref 96–106)
Creatinine, Ser: 0.79 mg/dL (ref 0.57–1.00)
GFR calc Af Amer: 76 mL/min/{1.73_m2} (ref >59)
GFR calc non Af Amer: 66 mL/min/{1.73_m2} (ref >59)
Glucose: 82 mg/dL (ref 65–99)
Potassium: 4.8 mmol/L (ref 3.5–5.2)
Sodium: 137 mmol/L (ref 134–144)

## 2020-03-31 LAB — MAGNESIUM: Magnesium: 2.1 mg/dL (ref 1.6–2.3)

## 2020-03-31 NOTE — Patient Instructions (Addendum)
Medication Instructions:  The current medical regimen is effective;  continue present plan and medications as directed. Please refer to the Current Medication list given to you today.  *If you need a refill on your cardiac medications before your next appointment, please call your pharmacy*  Lab Work:   Testing/Procedures:  BMET,MAG TODAY  NONE  Special Instructions Please try to avoid these triggers:  Do not use any products that have nicotine or tobacco in them. These include cigarettes, e-cigarettes, and chewing tobacco. If you need help quitting, ask your doctor.  Eat heart-healthy foods. Talk with your doctor about the right eating plan for you.  Exercise regularly as told by your doctor.  Do not drink alcohol, Caffeine or chocolate.  Lose weight if you are overweight.  Do not use drugs, including cannabis   Follow-Up: Your next appointment:  3 month(s) In Person with Peter Swaziland, MD OR IF UNAVAILABLE JESSE CLEAVER, FNP-C   At [facility], you and your health needs are our priority.  As part of our continuing mission to provide you with exceptional heart care, we have created designated Provider Care Teams.  These Care Teams include your primary Cardiologist (physician) and Advanced Practice Providers (APPs -  Physician Assistants and Nurse Practitioners) who all work together to provide you with the care you need, when you need it.

## 2020-04-04 DIAGNOSIS — R194 Change in bowel habit: Secondary | ICD-10-CM | POA: Diagnosis not present

## 2020-04-04 DIAGNOSIS — Z09 Encounter for follow-up examination after completed treatment for conditions other than malignant neoplasm: Secondary | ICD-10-CM | POA: Diagnosis not present

## 2020-04-04 DIAGNOSIS — R790 Abnormal level of blood mineral: Secondary | ICD-10-CM | POA: Diagnosis not present

## 2020-04-04 DIAGNOSIS — I4819 Other persistent atrial fibrillation: Secondary | ICD-10-CM | POA: Diagnosis not present

## 2020-04-13 ENCOUNTER — Other Ambulatory Visit: Payer: Self-pay | Admitting: Cardiology

## 2020-04-13 ENCOUNTER — Ambulatory Visit: Payer: Medicare PPO | Admitting: General Practice

## 2020-05-18 NOTE — Telephone Encounter (Signed)
Spoke with pt on the phone. Pt states that she has newly developed symptoms of bilateral ankle and leg swelling that can be red and itchy at times. Pt also mentions that she has trouble getting out to walk her dog and gets SOB with short distances.  Asked pt if she is taking her medication the way it is prescribed with no missed doses of any medication.  Pt does not weigh on a daily basis but was able to take her blood pressure while on the phone. Pt got results of 165/113 HR-90 and 157/91 HR-92.  Pt states that she does not keep a daily B/P log.  Able to get pt an appointment with Edd Fabian, NP on 05/26/20 at 10:45am.  Will pass this along to [facility] and Dr. Swaziland to advise.

## 2020-05-19 NOTE — Telephone Encounter (Signed)
Thanks. Will await evaluation with Regina  Landis Dowdy Swaziland MD, [facility]

## 2020-05-23 DIAGNOSIS — I4819 Other persistent atrial fibrillation: Secondary | ICD-10-CM | POA: Diagnosis not present

## 2020-05-23 DIAGNOSIS — K625 Hemorrhage of anus and rectum: Secondary | ICD-10-CM | POA: Diagnosis not present

## 2020-05-23 DIAGNOSIS — R109 Unspecified abdominal pain: Secondary | ICD-10-CM | POA: Diagnosis not present

## 2020-05-23 DIAGNOSIS — R140 Abdominal distension (gaseous): Secondary | ICD-10-CM | POA: Diagnosis not present

## 2020-05-24 NOTE — Progress Notes (Signed)
Cardiology Clinic Note   Patient Name: Regina Taylor Date of Encounter: 05/26/2020  Primary Care Provider:  Merri Brunette, MD Primary Cardiologist:  Peter Swaziland, MD  Patient Profile    Regina Taylor 87 year old female presents to the clinic today for follow-up evaluation of her atrial fibrillation shortness of breath.  Past Medical History    Past Medical History:  Diagnosis Date  . Arthritis   . Blindness of left eye   . Fatigue   . GERD (gastroesophageal reflux disease)   . Glaucoma   . Macular degeneration   . MVP (mitral valve prolapse)   . Palpitations   . SVT (supraventricular tachycardia) (HCC)    Past Surgical History:  Procedure Laterality Date  . CATARAC    . CATARACT EXTRACTION    . PARTIAL HIP ARTHROPLASTY    . RETINAL DETACHMENT SURGERY    . TOTAL HIP ARTHROPLASTY Right 1990    Allergies  Allergies  Allergen Reactions  . Iodinated Diagnostic Agents Nausea And Vomiting  . Latanoprost Other (See Comments)    Vision problems  . Xalatan     Vision problems    History of Present Illness    Ms. Meenan has a PMH of palpitations, SVT, GERD, atrial fibrillation, hyponatremia, diastolic CHF, and glaucoma.  She was  admitted to the hospital 03/17/2020 until 03/20/2020.  She presented to the emergency department with shortness of breath and orthopnea.  She reported this is been present for the past few days.  She presented to her PCP and was found to have A. fib with RVR.  She was diagnosed with new onset atrial fibrillation.  She reported chronic intermittent diarrhea since the summer.  She was placed on metoprolol and received Cardizem drip.  Her lab work showed low magnesium, troponin of 12, TSH was normal, CXR showed cardiomegaly with diffuse bilateral interstitial prominence.  She was given supplemental magnesium in the emergency department.  She was transitioned to p.o. Cardizem.  Her echocardiogram showed an EF of 60-65%, moderate LVH, mild MR,  AR, mild aortic valve sclerosis, mild pulmonary hypertension and severe LAE.  She was placed on Eliquis 5 mg twice daily, and her metoprolol was continued.  She was also diagnosed with new onset diastolic CHF.  She presented to the clinic 03/31/20 for follow-up evaluation with her son and stated she felt well.  She continued to take care of her two-story house.  She was exercising more regularly but had a fall 9/21.  She had an admission to the hospital.  She had not resumed her previous exercise routine.  She stated that she was somewhat hesitant to resume her exercise routine after her fall and hospital admission.  I reviewed her echocardiogram and EKG .  She denied bleeding issues.  I will ordered a BMP and magnesium level and planned follow-up in  3 months.  Her BMP was stable 03/31/2020.  She presents the clinic today for follow-up evaluation states over the last few weeks she has noticed increased lower extremity swelling and increased work of breathing.  She has also noticed some increased fatigue.  She has also noticed a dry hacking type cough.  She states that she has been eating a heart healthy diet but has not been reducing her salt intake.  She contacted nurse triage line on 05/18/2020 and was educated about sodium restriction.  I will give her the [place] support stocking sheet, salty 6 diet sheet, have encouraged her to wear self daily and contact the  office with weight gain of 3 pounds overnight or 5 pounds in 1 week, will give her furosemide for 3 days, order a BMP in 1 week and have her return for follow-up with Dr. Swaziland as scheduled.  Today she denies chest pain,palpitations, melena, hematuria, hemoptysis, diaphoresis, weakness, presyncope, syncope, orthopnea, and PND.  Home Medications    Prior to Admission medications   Medication Sig Start Date End Date Taking? Authorizing Provider  Artificial Tear Solution (BION TEARS OP) Apply 1 drop to eye daily as needed (dry eyes).     [provider]  diltiazem (CARDIZEM CD) 240 MG 24 hr capsule Take 1 capsule (240 mg total) by mouth daily. 04/13/20   Jimi Schappert, Thomasene Ripple, NP  ELIQUIS 5 MG TABS tablet TAKE 1 TABLET BY MOUTH TWICE DAILY. 04/13/20   Ronney Asters, NP  fish oil-omega-3 fatty acids 1000 MG capsule Take 1 g by mouth daily.     [provider]  Hypromellose (SYSTANE OVERNIGHT THERAPY) 0.3 % GEL Apply 1 drop to eye at bedtime.    [provider]  metoprolol succinate (TOPROL-XL) 50 MG 24 hr tablet Take 1 tablet (50 mg total) by mouth daily. 04/13/20   Ronney Asters, NP  Multiple Vitamin (MULTIVITAMIN) tablet Take 1 tablet by mouth daily.     [provider]  Multiple Vitamins-Minerals (ICAPS PO) Take 1 capsule by mouth daily.     [provider]  Red Yeast Rice Extract (RED YEAST RICE PO) Take 1 tablet by mouth daily.     [provider]  TIMOPTIC OCUDOSE 0.5 % SOLN Place 1 drop into the right eye daily.  11/28/13   [provider]    Family History    Family History  Problem Relation Age of Onset  . Cancer Mother   . Heart disease Father   . Heart attack Brother    She indicated that her mother is deceased. She indicated that her father is deceased. She indicated that her brother is deceased.  Social History    Social History   Socioeconomic History  . Marital status: Divorced    Spouse name: Not on file  . Number of children: 1  . Years of education: Not on file  . Highest education level: Not on file  Occupational History  . Occupation: foreign language professor    Employer: RETIRED    Comment: retired  Tobacco Use  . Smoking status: Former Smoker    Quit date: 09/07/1979    Years since quitting: 40.7  . Smokeless tobacco: Never Used  Substance and Sexual Activity  . Alcohol use: Yes    Comment: occaisional wine  . Drug use: No  . Sexual activity: Not on file  Other Topics Concern  . Not on file  Social History Narrative   . Not on file   Social Determinants of Health   Financial Resource Strain: Not on file  Food Insecurity: Not on file  Transportation Needs: Not on file  Physical Activity: Not on file  Stress: Not on file  Social Connections: Not on file  Intimate Partner Violence: Not on file     Review of Systems    General:  No chills, fever, night sweats or weight changes.  Cardiovascular:  No chest pain, dyspnea on exertion, edema, orthopnea, palpitations, paroxysmal nocturnal dyspnea. Dermatological: No rash, lesions/masses Respiratory: No cough, dyspnea Urologic: No hematuria, dysuria Abdominal:   No nausea, vomiting, diarrhea, bright red blood per rectum, melena, or hematemesis Neurologic:  No visual changes, wkns, changes in mental status. All other systems reviewed and are otherwise negative except as noted above.  Physical Exam    VS:  BP 138/84   Pulse 92   Ht 5\' 5"  (1.651 m)   Wt 171 lb 6.4 oz (77.7 kg)   SpO2 95%   BMI 28.52 kg/m  , BMI Body mass index is 28.52 kg/m. GEN: Well nourished, well developed, in no acute distress. HEENT: normal. Neck: Supple, no JVD, carotid bruits, or masses. Cardiac: Irregularly irregular, no murmurs, rubs, or gallops. No clubbing, cyanosis, +2 pitting bilateral lower extremity edema.  Radials/DP/PT 2+ and equal bilaterally.  Respiratory:  Respirations regular and unlabored, clear to auscultation bilaterally. GI: Soft, nontender, nondistended, BS + x 4. MS: no deformity or atrophy. Skin: warm and dry, no rash. Neuro:  Strength and sensation are intact. Psych: Normal affect.  Accessory Clinical Findings    Recent Labs: 03/17/2020: B Natriuretic Peptide 417.4; TSH 1.341 03/18/2020: Hemoglobin 12.5; Platelets 172 03/31/2020: BUN 16; Creatinine, Ser 0.79; Magnesium 2.1; Potassium 4.8; Sodium 137   Recent Lipid Panel    Component Value Date/Time   CHOL 147 01/25/2014 2340   TRIG 63 01/25/2014 2340   HDL 47 01/25/2014 2340   CHOLHDL 3.1  01/25/2014 2340   VLDL 13 01/25/2014 2340   LDLCALC 87 01/25/2014 2340    ECG personally reviewed by me today-none today.  EKG 03/31/2020 atrial fibrillation with competing junctional pacemaker 88 bpm  Echocardiogram 03/18/2020 1. Left ventricular ejection fraction, by estimation, is 60 to 65%. The  left ventricle has normal function. The left ventricle has no regional  wall motion abnormalities. There is moderate concentric left ventricular  hypertrophy. Left ventricular  diastolic function could not be evaluated.  2. Right ventricular systolic function is normal. The right ventricular  size is normal. There is moderately elevated pulmonary artery systolic  pressure. The estimated right ventricular systolic pressure is 45.2 mmHg.  3. Left atrial size was severely dilated.  4. Right atrial size was mildly dilated.  5. The mitral valve is degenerative. Trivial mitral valve regurgitation.  No evidence of mitral stenosis.  6. The aortic valve is tricuspid. There is mild calcification of the  aortic valve. There is mild thickening of the aortic valve. Aortic valve  regurgitation is mild. Mild aortic valve sclerosis is present, with no  evidence of aortic valve stenosis.  7. The inferior vena cava is dilated in size with <50% respiratory  variability, suggesting right atrial pressure of 15 mmHg.   Assessment & Plan   1.  Atrial fibrillation with RVR/history of SVT -heart rate today 92.  She had previously presented to her PCP with new onset shortness of breath and orthopnea.  She was found to be in A. fib RVR.  She was treated with metoprolol and Cardizem for rate control.  She was also started on apixaban during her admission.  Denies bleeding issues. Continue metoprolol, Cardizem, apixaban Heart healthy low-sodium diet-salty 6 given Increase physical activity as tolerated Avoid triggers caffeine, chocolate, EtOH etc.  Acute diastolic CHF-+2 pitting bilateral lower  extremity.  Has noticed increased activity intolerance or DOE today.   Order furosemide 20 mg daily x3 days then stop Continue metoprolol, Cardizem Heart healthy low-sodium diet-salty 6 given Increase physical activity as tolerated [place] support stocking sheet given Daily weights-contact office with 3 pound weight gain overnight or 5 pound in 1 week BMP in 1 week  Hypomagnesemia-initial magnesium was less than 0.1.  At discharge  magnesium 2.1.  Reported chronic diarrhea since summer which is now resolved.   Disposition: Follow-up with Dr. [place] or me in 6 months.   Jossie Ng. Hampton Wixom NP-C    05/26/2020, 11:07 AM [place] [address] [address] Office [phone] Fax [phone]  Notice: This dictation was prepared with Dragon dictation along with smaller phrase technology. Any transcriptional errors that result from this process are unintentional and may not be corrected upon review.

## 2020-05-26 ENCOUNTER — Ambulatory Visit: Payer: Medicare PPO | Admitting: General Practice

## 2020-05-26 ENCOUNTER — Encounter: Payer: Self-pay | Admitting: General Practice

## 2020-05-26 ENCOUNTER — Other Ambulatory Visit: Payer: Self-pay

## 2020-05-26 VITALS — BP 138/84 | HR 92 | Ht 65.0 in | Wt 171.4 lb

## 2020-05-26 DIAGNOSIS — Z79899 Other long term (current) drug therapy: Secondary | ICD-10-CM

## 2020-05-26 DIAGNOSIS — I4819 Other persistent atrial fibrillation: Secondary | ICD-10-CM

## 2020-05-26 DIAGNOSIS — I5030 Unspecified diastolic (congestive) heart failure: Secondary | ICD-10-CM

## 2020-05-26 MED ORDER — FUROSEMIDE 20 MG PO TABS: 20.0000 mg | ORAL_TABLET | Freq: Every day | ORAL | 0 refills | Status: DC

## 2020-05-26 NOTE — Patient Instructions (Signed)
Medication Instructions:  TAKE FUROSEMIDE 20MG  DAILY FOR 3 DAYS THEN STOP *If you need a refill on your cardiac medications before your next appointment, please call your pharmacy*  Lab Work:    NON FASTING BMET IN 1 WEEK 06-02-2020-HERE IN OUR OFFICE OR ANY [facility]  Special Instructions TAKE AND LOG YOUR WEIGHT DAILY. PLEASE CALL OUR OFFICE IF YOUR WEIGHT IS >3 POUNDS IN A DAY OF 5 POUNDS IN A WEEK  PLEASE READ AND FOLLOW SALTY 6-ATTACHED-1,800mg  daily  PLEASE PURCHASE AND WEAR COMPRESSION STOCKINGS DAILY AND TAKE OFF AT BEDTIME. Compression stockings are elastic socks that squeeze the legs. They help to increase blood flow to the legs and to decrease swelling in the legs from fluid retention, and reduce the chance of developing blood clots in the lower legs. Please put on in the AM when dressing and off at night when dressing for bed. [employer]  [address] 06-04-2020 ([phone]); [place], [place]; [phone]  EMAIL   [email or web address] LET THEM KNOW THAT YOU NEED KNEE HIGH'S AND COMPRESSION OF 15-20 mmhg.  PLEASE MAKE SURE TO ELEVATE YOUR FEET & LEGS WHILE SITTING, THIS WILL HELP WITH THE SWELLING ALSO.   Follow-Up: Your next appointment:  KEEP SCHEDULED APPOINTMENT  In Person with Peter [phone], MD   At [facility], you and your health needs are our priority.  As part of our continuing mission to provide you with exceptional heart care, we have created designated Provider Care Teams.  These Care Teams include your primary Cardiologist (physician) and Advanced Practice Providers (APPs -  Physician Assistants and Nurse Practitioners) who all work together to provide you with the care you need, when you need it.            6 SALTY THINGS TO AVOID     1,800MG  DAILY

## 2020-06-01 DIAGNOSIS — I5030 Unspecified diastolic (congestive) heart failure: Secondary | ICD-10-CM | POA: Diagnosis not present

## 2020-06-01 DIAGNOSIS — Z79899 Other long term (current) drug therapy: Secondary | ICD-10-CM | POA: Diagnosis not present

## 2020-06-01 DIAGNOSIS — I4819 Other persistent atrial fibrillation: Secondary | ICD-10-CM | POA: Diagnosis not present

## 2020-06-02 LAB — BASIC METABOLIC PANEL
BUN/Creatinine Ratio: 19 (ref 12–28)
BUN: 16 mg/dL (ref 10–36)
CO2: 21 mmol/L (ref 20–29)
Calcium: 9.6 mg/dL (ref 8.7–10.3)
Chloride: 102 mmol/L (ref 96–106)
Creatinine, Ser: 0.84 mg/dL (ref 0.57–1.00)
GFR calc Af Amer: 71 mL/min/{1.73_m2} (ref >59)
GFR calc non Af Amer: 61 mL/min/{1.73_m2} (ref >59)
Glucose: 89 mg/dL (ref 65–99)
Potassium: 5 mmol/L (ref 3.5–5.2)
Sodium: 140 mmol/L (ref 134–144)

## 2020-06-13 ENCOUNTER — Other Ambulatory Visit: Payer: Self-pay | Admitting: Gastroenterology

## 2020-06-13 DIAGNOSIS — R1319 Other dysphagia: Secondary | ICD-10-CM

## 2020-06-13 DIAGNOSIS — R140 Abdominal distension (gaseous): Secondary | ICD-10-CM | POA: Diagnosis not present

## 2020-06-13 DIAGNOSIS — Z8679 Personal history of other diseases of the circulatory system: Secondary | ICD-10-CM | POA: Diagnosis not present

## 2020-06-13 DIAGNOSIS — K625 Hemorrhage of anus and rectum: Secondary | ICD-10-CM | POA: Diagnosis not present

## 2020-06-16 DIAGNOSIS — H31001 Unspecified chorioretinal scars, right eye: Secondary | ICD-10-CM | POA: Diagnosis not present

## 2020-06-16 DIAGNOSIS — H401113 Primary open-angle glaucoma, right eye, severe stage: Secondary | ICD-10-CM | POA: Diagnosis not present

## 2020-06-16 DIAGNOSIS — H04123 Dry eye syndrome of bilateral lacrimal glands: Secondary | ICD-10-CM | POA: Diagnosis not present

## 2020-06-20 ENCOUNTER — Ambulatory Visit
Admission: RE | Admit: 2020-06-20 | Discharge: 2020-06-20 | Disposition: A | Discharge status: Home / Self Care | Payer: Medicare PPO | Source: Ambulatory Visit | Attending: Gastroenterology | Admitting: Gastroenterology

## 2020-06-20 DIAGNOSIS — R1319 Other dysphagia: Secondary | ICD-10-CM

## 2020-06-20 DIAGNOSIS — K224 Dyskinesia of esophagus: Secondary | ICD-10-CM | POA: Diagnosis not present

## 2020-06-27 ENCOUNTER — Other Ambulatory Visit (HOSPITAL_COMMUNITY): Payer: Self-pay

## 2020-06-27 DIAGNOSIS — R1310 Dysphagia, unspecified: Secondary | ICD-10-CM

## 2020-07-05 ENCOUNTER — Other Ambulatory Visit: Payer: Self-pay

## 2020-07-05 ENCOUNTER — Ambulatory Visit (HOSPITAL_COMMUNITY)
Admission: RE | Admit: 2020-07-05 | Discharge: 2020-07-05 | Disposition: A | Discharge status: Home / Self Care | Payer: Medicare PPO | Source: Ambulatory Visit | Attending: Gastroenterology | Admitting: Gastroenterology

## 2020-07-05 DIAGNOSIS — R059 Cough, unspecified: Secondary | ICD-10-CM | POA: Diagnosis not present

## 2020-07-05 DIAGNOSIS — R1310 Dysphagia, unspecified: Secondary | ICD-10-CM | POA: Insufficient documentation

## 2020-07-05 NOTE — Progress Notes (Signed)
Modified Barium Swallow Progress Note  Patient Details  Name: Regina Taylor MRN: [id number] Date of Birth: Jan 26, 1928  Today's Date: 07/05/2020  Modified Barium Swallow completed.  Full report located under Chart Review in the Imaging Section.  Brief recommendations include the following:  Clinical Impression  Pt exhibited a mild pharyngoesophageal dysphagia with minimal penetration. Pt's oral-motor examnination, vocal quality and cough was unremarkable. Oral phase was within functional limits with a noted piecemeal transit pattern. Her laryngeal elevation was mildly decreased and laryngeal closure was incomplete intermittently. Thin barium penetration mild amount into vestibule and remained on anterior wall of vestibule. Mild pyriform sinsus residue that she eventually reflexively swallowed to clear. Her cricopharyngeal muscle is prominent but opened and closed adequately. Barium pill remained in valleculae for several seconds until spontaneous second swallow cleared. MBS does not diagnose below the level of the UES but scan did not reveal significant findings. Pt's symptoms likely from esophageal dysmotility with times of decreased laryngeal closure resulting in transient penetration. Provided pt with handout and verbal education on esophageal precautions. Recommend regular diet/thin liquids with precautions and pills whole in puree.   Swallow Evaluation Recommendations       SLP Diet Recommendations: Regular solids;Thin liquid   Liquid Administration via: Straw;Cup   Medication Administration: Whole meds with puree   Supervision: Patient able to self feed   Compensations: Slow rate;Small sips/bites;Multiple dry swallows after each bite/sip   Postural Changes: Seated upright at 90 degrees;Remain semi-upright after after feeds/meals (Comment)   Oral Care Recommendations: Oral care BID        Royce Macadamia 07/05/2020,3:53 PM  Breck Coons Lonell Face.Ed  Nurse, children's [phone] Office [phone]

## 2020-07-16 NOTE — Progress Notes (Signed)
Cardiology Office Note   Date:  07/18/2020   ID:  Payten, Hobin [birth year], MRN [id number]  PCP:  Merri Brunette, MD  Cardiologist:   Zalia Hautala [place], MD   Chief Complaint  Patient presents with  . Congestive Heart Failure      History of Present Illness: Regina Taylor is a 87 y.o. female who presents for follow up Afib. She has a PMH of SVT, GERD, atrial fibrillation, hyponatremia, diastolic CHF, and glaucoma.  She was  admitted to the hospital 03/17/2020.She presented to the emergency department with shortness of breath and orthopnea. She reported this is been present for the past few days. She  was found to have A. fib with RVR. Her echocardiogram showed an EF of 60-65%, moderate LVH, mild MR, AR, mild aortic valve sclerosis, mild pulmonary hypertension and severe LAE. She was placed on Eliquis 5 mg twice daily, and rate control with Cardizem and  Metoprolol. She was diuresed but not continued on diuretics.   She was seen in January with some increased swelling. Given lasix for 3 days and instructed on low sodium diet. She has been using support hose.   Despite good rate control she has continued to have significant symptoms of dyspnea with any exertion. Is constantly SOB. Was able to walk her dog before but no longer can. Still has some swelling in her legs. Occasional lightheadedness. No chest pain. Has had swallowing evaluation with some dysmotility noted.     Past Medical History:  Diagnosis Date  . Arthritis   . Blindness of left eye   . Fatigue   . GERD (gastroesophageal reflux disease)   . Glaucoma   . Macular degeneration   . MVP (mitral valve prolapse)   . Palpitations   . SVT (supraventricular tachycardia) (HCC)     Past Surgical History:  Procedure Laterality Date  . CATARAC    . CATARACT EXTRACTION    . PARTIAL HIP ARTHROPLASTY    . RETINAL DETACHMENT SURGERY    . TOTAL HIP ARTHROPLASTY Right 1990     Current Outpatient Medications   Medication Sig Dispense Refill  . amiodarone (PACERONE) 200 MG tablet Take 2 tablets (400 mg total) by mouth daily. 180 tablet 3  . Artificial Tear Solution (BION TEARS OP) Apply 1 drop to eye daily as needed (dry eyes).    Marland Kitchen diltiazem (CARDIZEM CD) 240 MG 24 hr capsule Take 1 capsule (240 mg total) by mouth daily. 60 capsule 1  . ELIQUIS 5 MG TABS tablet TAKE 1 TABLET BY MOUTH TWICE DAILY. 180 tablet 1  . hypromellose (GENTEAL) 0.3 % GEL ophthalmic ointment Apply 1 drop to eye at bedtime.    . metoprolol succinate (TOPROL-XL) 50 MG 24 hr tablet Take 1 tablet (50 mg total) by mouth daily. 60 tablet 1  . Multiple Vitamin (MULTIVITAMIN) tablet Take 1 tablet by mouth daily.    . Multiple Vitamins-Minerals (ICAPS PO) Take 1 capsule by mouth daily.    . pantoprazole (PROTONIX) 40 MG tablet 1 tablet    . Red Yeast Rice Extract (RED YEAST RICE PO) Take 1 tablet by mouth daily.    Marland Kitchen TIMOPTIC OCUDOSE 0.5 % SOLN Place 1 drop into the right eye daily.      No current facility-administered medications for this visit.    Allergies:   Shellfish-derived products, Iodinated diagnostic agents, Latanoprost, and Xalatan    Social History:  The patient  reports that she quit smoking about 40 years ago.  She has never used smokeless tobacco. She reports current alcohol use. She reports that she does not use drugs.   Family History:  The patient's family history includes Cancer in her mother; Heart attack in her brother; Heart disease in her father.    ROS:  Please see the history of present illness.   Otherwise, review of systems are positive for none.   All other systems are reviewed and negative.    PHYSICAL EXAM: VS:  BP 140/78   Pulse 88   Ht 5' 4.5" (1.638 m)   Wt 169 lb 9.6 oz (76.9 kg)   SpO2 97%   BMI 28.66 kg/m  , BMI Body mass index is 28.66 kg/m. GEN: Well nourished, well developed, in no acute distress  HEENT: normal  Neck: no JVD, carotid bruits, or masses Cardiac: IRRR; no murmurs,  rubs, or gallops, 1+ edema - support stockings in place.  Respiratory:  clear to auscultation bilaterally, normal work of breathing GI: soft, nontender, nondistended, + BS MS: no deformity or atrophy  Skin: warm and dry, no rash Neuro:  Strength and sensation are intact Psych: euthymic mood, full affect   EKG:  EKG is not ordered today. The ekg ordered today demonstrates N/A   Recent Labs: 03/17/2020: B Natriuretic Peptide 417.4; TSH 1.341 03/18/2020: Hemoglobin 12.5; Platelets 172 03/31/2020: Magnesium 2.1 06/01/2020: BUN 16; Creatinine, Ser 0.84; Potassium 5.0; Sodium 140    Lipid Panel    Component Value Date/Time   CHOL 147 01/25/2014 2340   TRIG 63 01/25/2014 2340   HDL 47 01/25/2014 2340   CHOLHDL 3.1 01/25/2014 2340   VLDL 13 01/25/2014 2340   LDLCALC 87 01/25/2014 2340    Dated 06/13/20: Normal CMET and CBC  Wt Readings from Last 3 Encounters:  07/18/20 169 lb 9.6 oz (76.9 kg)  05/26/20 171 lb 6.4 oz (77.7 kg)  03/31/20 170 lb (77.1 kg)      Other studies Reviewed: Additional studies/ records that were reviewed today include:   Echo 03/18/20: IMPRESSIONS    1. Left ventricular ejection fraction, by estimation, is 60 to 65%. The  left ventricle has normal function. The left ventricle has no regional  wall motion abnormalities. There is moderate concentric left ventricular  hypertrophy. Left ventricular  diastolic function could not be evaluated.  2. Right ventricular systolic function is normal. The right ventricular  size is normal. There is moderately elevated pulmonary artery systolic  pressure. The estimated right ventricular systolic pressure is 45.2 mmHg.  3. Left atrial size was severely dilated.  4. Right atrial size was mildly dilated.  5. The mitral valve is degenerative. Trivial mitral valve regurgitation.  No evidence of mitral stenosis.  6. The aortic valve is tricuspid. There is mild calcification of the  aortic valve. There is mild  thickening of the aortic valve. Aortic valve  regurgitation is mild. Mild aortic valve sclerosis is present, with no  evidence of aortic valve stenosis.  7. The inferior vena cava is dilated in size with <50% respiratory  variability, suggesting right atrial pressure of 15 mmHg.   Comparison(s): Changes from prior study are noted. In Afib with RVR. EF  remains the same. RVSP ~45 mmHG on this study  ASSESSMENT AND PLAN:   1.  Atrial fibrillation -persistent and symptomatic - severe LAE - on metoprolol and Cardizem for rate control.  - on  apixaban for anticoagulation.  - Heart healthy low-sodium diet - due to refractory and limiting symptoms I have recommended we  attempt to restore NSR. We discussed AAD therapy and ablation. Will initiate amiodarone at 400 mg daily. Continue current therapy. Follow up in 4 weeks. If tolerating medication and still in Afib will arrange for DCCV.   2. Acute on chronic diastolic CHF-+1   Class 3 symptoms Continuemetoprolol, Cardizem Heart healthy low-sodium diet continue support stocking Daily weights-contact office with 3 pound weight gain overnight or 5 pound in 1 week   3. Hypomagnesemia-repleted. Reported chronic diarrhea since summer which is now resolved.   Current medicines are reviewed at length with the patient today.  The patient does not have concerns regarding medicines.  The following changes have been made:  See above  Labs/ tests ordered today include:  No orders of the defined types were placed in this encounter.    Disposition:   FU with me in 4 weeks  Signed, Zedric Deroy [place], MD  07/18/2020 10:06 AM    [facility] [address], [place], [place], [address] Phone [phone], Fax [phone]

## 2020-07-18 ENCOUNTER — Encounter: Payer: Self-pay | Admitting: Cardiology

## 2020-07-18 ENCOUNTER — Ambulatory Visit: Payer: Medicare PPO | Admitting: Cardiology

## 2020-07-18 ENCOUNTER — Other Ambulatory Visit: Payer: Self-pay

## 2020-07-18 VITALS — BP 140/78 | HR 88 | Ht 64.5 in | Wt 169.6 lb

## 2020-07-18 DIAGNOSIS — I5033 Acute on chronic diastolic (congestive) heart failure: Secondary | ICD-10-CM

## 2020-07-18 DIAGNOSIS — I4819 Other persistent atrial fibrillation: Secondary | ICD-10-CM | POA: Diagnosis not present

## 2020-07-18 MED ORDER — AMIODARONE HCL 200 MG PO TABS: 400.0000 mg | ORAL_TABLET | Freq: Every day | ORAL | 3 refills | Status: DC

## 2020-07-18 NOTE — Patient Instructions (Signed)
Start on amiodarone 400 mg daily  Follow up in one month

## 2020-07-29 DIAGNOSIS — H4421 Degenerative myopia, right eye: Secondary | ICD-10-CM | POA: Diagnosis not present

## 2020-07-29 DIAGNOSIS — H35341 Macular cyst, hole, or pseudohole, right eye: Secondary | ICD-10-CM | POA: Diagnosis not present

## 2020-07-29 DIAGNOSIS — Z970 Presence of artificial eye: Secondary | ICD-10-CM | POA: Diagnosis not present

## 2020-07-29 DIAGNOSIS — H401110 Primary open-angle glaucoma, right eye, stage unspecified: Secondary | ICD-10-CM | POA: Diagnosis not present

## 2020-07-29 DIAGNOSIS — H18421 Band keratopathy, right eye: Secondary | ICD-10-CM | POA: Diagnosis not present

## 2020-08-03 ENCOUNTER — Other Ambulatory Visit: Payer: Self-pay | Admitting: General Practice

## 2020-08-12 NOTE — H&P (View-Only) (Signed)
Cardiology Office Note   Date:  08/15/2020   ID:  [name], [name] 12-16-1927, MRN [id number]  PCP:  Merri Brunette, MD  Cardiologist:   Demarcus Thielke [place], MD   No chief complaint on file.     History of Present Illness: Regina Taylor is a 87 y.o. female who presents for follow up Afib. She has a PMH of SVT, GERD, atrial fibrillation, hyponatremia, diastolic CHF, and glaucoma.  She was  admitted to the hospital 03/17/2020.She presented to the emergency department with shortness of breath and orthopnea. She reported this is been present for the past few days. She  was found to have A. fib with RVR. Her echocardiogram showed an EF of 60-65%, moderate LVH, mild MR, AR, mild aortic valve sclerosis, mild pulmonary hypertension and severe LAE. She was placed on Eliquis 5 mg twice daily, and rate control with Cardizem and  Metoprolol. She was diuresed but not continued on diuretics.   She was seen in January with some increased swelling. Given lasix for 3 days and instructed on low sodium diet. She has been using support hose.   When seen one month ago she continued to have significant symptoms of dyspnea with any exertion. Is constantly SOB. Was able to walk her dog before but no longer can. Still has some swelling in her legs. Occasional lightheadedness. No chest pain. Has had swallowing evaluation with some dysmotility noted. After discussion of treatment options she was started on amiodarone- currently on 400 mg daily.    She reports she is tolerating this well. Still doesn't feel great in the morning and still gets SOB with activity.      Past Medical History:  Diagnosis Date  . Arthritis   . Blindness of left eye   . Fatigue   . GERD (gastroesophageal reflux disease)   . Glaucoma   . Macular degeneration   . MVP (mitral valve prolapse)   . Palpitations   . SVT (supraventricular tachycardia) (HCC)     Past Surgical History:  Procedure Laterality Date  . CATARAC    .  CATARACT EXTRACTION    . PARTIAL HIP ARTHROPLASTY    . RETINAL DETACHMENT SURGERY    . TOTAL HIP ARTHROPLASTY Right 1990     Current Outpatient Medications  Medication Sig Dispense Refill  . amiodarone (PACERONE) 200 MG tablet Take 2 tablets (400 mg total) by mouth daily. 180 tablet 3  . Artificial Tear Solution (BION TEARS OP) Apply 1 drop to eye daily as needed (dry eyes).    Marland Kitchen diltiazem (CARDIZEM CD) 240 MG 24 hr capsule TAKE (1) CAPSULE DAILY. 60 capsule 1  . ELIQUIS 5 MG TABS tablet TAKE 1 TABLET BY MOUTH TWICE DAILY. 180 tablet 1  . hypromellose (GENTEAL) 0.3 % GEL ophthalmic ointment Apply 1 drop to eye at bedtime.    . metoprolol succinate (TOPROL-XL) 50 MG 24 hr tablet TAKE 1 TABLET DAILY. 60 tablet 1  . Multiple Vitamins-Minerals (ICAPS PO) Take 1 capsule by mouth daily.    . pantoprazole (PROTONIX) 40 MG tablet 1 tablet    . Red Yeast Rice Extract (RED YEAST RICE PO) Take 1 tablet by mouth daily.    Marland Kitchen TIMOPTIC OCUDOSE 0.5 % SOLN Place 1 drop into the right eye daily.      No current facility-administered medications for this visit.    Allergies:   Shellfish-derived products, Iodinated diagnostic agents, Latanoprost, and Xalatan    Social History:  The patient  reports  that she quit smoking about 40 years ago. She has never used smokeless tobacco. She reports current alcohol use. She reports that she does not use drugs.   Family History:  The patient's family history includes Cancer in her mother; Heart attack in her brother; Heart disease in her father.    ROS:  Please see the history of present illness.   Otherwise, review of systems are positive for none.   All other systems are reviewed and negative.    PHYSICAL EXAM: VS:  BP (!) 152/80 (BP Location: Left Arm, Patient Position: Sitting)   Pulse 65   Ht 5' 4.5" (1.638 m)   Wt 168 lb (76.2 kg)   SpO2 99%   BMI 28.39 kg/m  , BMI Body mass index is 28.39 kg/m. GEN: Well nourished, well developed, in no acute  distress  HEENT: normal  Neck: no JVD, carotid bruits, or masses Cardiac: IRRR; no murmurs, rubs, or gallops, 1+ edema - support stockings in place.  Respiratory:  clear to auscultation bilaterally, normal work of breathing GI: soft, nontender, nondistended, + BS MS: no deformity or atrophy  Skin: warm and dry, no rash Neuro:  Strength and sensation are intact Psych: euthymic mood, full affect   EKG:  EKG is ordered today. The ekg ordered today demonstrates Afib rate 72. Otherwise normal. I have personally reviewed and interpreted this study.    Recent Labs: 03/17/2020: B Natriuretic Peptide 417.4; TSH 1.341 03/18/2020: Hemoglobin 12.5; Platelets 172 03/31/2020: Magnesium 2.1 06/01/2020: BUN 16; Creatinine, Ser 0.84; Potassium 5.0; Sodium 140    Lipid Panel    Component Value Date/Time   CHOL 147 01/25/2014 2340   TRIG 63 01/25/2014 2340   HDL 47 01/25/2014 2340   CHOLHDL 3.1 01/25/2014 2340   VLDL 13 01/25/2014 2340   LDLCALC 87 01/25/2014 2340    Dated 06/13/20: Normal CMET and CBC  Wt Readings from Last 3 Encounters:  08/15/20 168 lb (76.2 kg)  07/18/20 169 lb 9.6 oz (76.9 kg)  05/26/20 171 lb 6.4 oz (77.7 kg)      Other studies Reviewed: Additional studies/ records that were reviewed today include:   Echo 03/18/20: IMPRESSIONS    1. Left ventricular ejection fraction, by estimation, is 60 to 65%. The  left ventricle has normal function. The left ventricle has no regional  wall motion abnormalities. There is moderate concentric left ventricular  hypertrophy. Left ventricular  diastolic function could not be evaluated.  2. Right ventricular systolic function is normal. The right ventricular  size is normal. There is moderately elevated pulmonary artery systolic  pressure. The estimated right ventricular systolic pressure is 45.2 mmHg.  3. Left atrial size was severely dilated.  4. Right atrial size was mildly dilated.  5. The mitral valve is  degenerative. Trivial mitral valve regurgitation.  No evidence of mitral stenosis.  6. The aortic valve is tricuspid. There is mild calcification of the  aortic valve. There is mild thickening of the aortic valve. Aortic valve  regurgitation is mild. Mild aortic valve sclerosis is present, with no  evidence of aortic valve stenosis.  7. The inferior vena cava is dilated in size with <50% respiratory  variability, suggesting right atrial pressure of 15 mmHg.   Comparison(s): Changes from prior study are noted. In Afib with RVR. EF  remains the same. RVSP ~45 mmHG on this study  ASSESSMENT AND PLAN:   1.  Atrial fibrillation -persistent and symptomatic - severe LAE - on metoprolol and Cardizem for  rate control.  - on  apixaban for anticoagulation.  - has been on amiodarone 400 mg daily for one month. Will reduce dose to 200 mg daily for her age.  - due to refractory and limiting symptoms I have recommended we attempt to restore NSR. Will arrange for DCCV. - check lytes, LFTs, TSH.    2. Acute on chronic diastolic CHF-+1   Class 3 symptoms Continuemetoprolol, Cardizem Heart healthy low-sodium diet continue support stocking Daily weights-contact office with 3 pound weight gain overnight or 5 pound in 1 week   3. Hypomagnesemia-repleted. Reported chronic diarrhea since summer which is now resolved.    Current medicines are reviewed at length with the patient today.  The patient does not have concerns regarding medicines.  The following changes have been made:  See above  Labs/ tests ordered today include:  No orders of the defined types were placed in this encounter.    Disposition:   FU 2 weeks post DCCV  Signed, Dasan Hardman [place], MD  08/15/2020 10:39 AM    [facility] [address], [place], [place], [address] Phone [phone], Fax [phone]

## 2020-08-12 NOTE — Progress Notes (Signed)
Cardiology Office Note   Date:  08/15/2020   ID:  Regina, Taylor 12-16-1927, MRN [id number]  PCP:  Merri Brunette, MD  Cardiologist:   Peter [place], MD   No chief complaint on file.     History of Present Illness: Regina Taylor is a 87 y.o. female who presents for follow up Afib. She has a PMH of SVT, GERD, atrial fibrillation, hyponatremia, diastolic CHF, and glaucoma.  She was  admitted to the hospital 03/17/2020.She presented to the emergency department with shortness of breath and orthopnea. She reported this is been present for the past few days. She  was found to have A. fib with RVR. Her echocardiogram showed an EF of 60-65%, moderate LVH, mild MR, AR, mild aortic valve sclerosis, mild pulmonary hypertension and severe LAE. She was placed on Eliquis 5 mg twice daily, and rate control with Cardizem and  Metoprolol. She was diuresed but not continued on diuretics.   She was seen in January with some increased swelling. Given lasix for 3 days and instructed on low sodium diet. She has been using support hose.   When seen one month ago she continued to have significant symptoms of dyspnea with any exertion. Is constantly SOB. Was able to walk her dog before but no longer can. Still has some swelling in her legs. Occasional lightheadedness. No chest pain. Has had swallowing evaluation with some dysmotility noted. After discussion of treatment options she was started on amiodarone- currently on 400 mg daily.    She reports she is tolerating this well. Still doesn't feel great in the morning and still gets SOB with activity.      Past Medical History:  Diagnosis Date  . Arthritis   . Blindness of left eye   . Fatigue   . GERD (gastroesophageal reflux disease)   . Glaucoma   . Macular degeneration   . MVP (mitral valve prolapse)   . Palpitations   . SVT (supraventricular tachycardia) (HCC)     Past Surgical History:  Procedure Laterality Date  . CATARAC    .  CATARACT EXTRACTION    . PARTIAL HIP ARTHROPLASTY    . RETINAL DETACHMENT SURGERY    . TOTAL HIP ARTHROPLASTY Right 1990     Current Outpatient Medications  Medication Sig Dispense Refill  . amiodarone (PACERONE) 200 MG tablet Take 2 tablets (400 mg total) by mouth daily. 180 tablet 3  . Artificial Tear Solution (BION TEARS OP) Apply 1 drop to eye daily as needed (dry eyes).    Marland Kitchen diltiazem (CARDIZEM CD) 240 MG 24 hr capsule TAKE (1) CAPSULE DAILY. 60 capsule 1  . ELIQUIS 5 MG TABS tablet TAKE 1 TABLET BY MOUTH TWICE DAILY. 180 tablet 1  . hypromellose (GENTEAL) 0.3 % GEL ophthalmic ointment Apply 1 drop to eye at bedtime.    . metoprolol succinate (TOPROL-XL) 50 MG 24 hr tablet TAKE 1 TABLET DAILY. 60 tablet 1  . Multiple Vitamins-Minerals (ICAPS PO) Take 1 capsule by mouth daily.    . pantoprazole (PROTONIX) 40 MG tablet 1 tablet    . Red Yeast Rice Extract (RED YEAST RICE PO) Take 1 tablet by mouth daily.    Marland Kitchen TIMOPTIC OCUDOSE 0.5 % SOLN Place 1 drop into the right eye daily.      No current facility-administered medications for this visit.    Allergies:   Shellfish-derived products, Iodinated diagnostic agents, Latanoprost, and Xalatan    Social History:  The patient  reports  that she quit smoking about 40 years ago. She has never used smokeless tobacco. She reports current alcohol use. She reports that she does not use drugs.   Family History:  The patient's family history includes Cancer in her mother; Heart attack in her brother; Heart disease in her father.    ROS:  Please see the history of present illness.   Otherwise, review of systems are positive for none.   All other systems are reviewed and negative.    PHYSICAL EXAM: VS:  BP (!) 152/80 (BP Location: Left Arm, Patient Position: Sitting)   Pulse 65   Ht 5' 4.5" (1.638 m)   Wt 168 lb (76.2 kg)   SpO2 99%   BMI 28.39 kg/m  , BMI Body mass index is 28.39 kg/m. GEN: Well nourished, well developed, in no acute  distress  HEENT: normal  Neck: no JVD, carotid bruits, or masses Cardiac: IRRR; no murmurs, rubs, or gallops, 1+ edema - support stockings in place.  Respiratory:  clear to auscultation bilaterally, normal work of breathing GI: soft, nontender, nondistended, + BS MS: no deformity or atrophy  Skin: warm and dry, no rash Neuro:  Strength and sensation are intact Psych: euthymic mood, full affect   EKG:  EKG is ordered today. The ekg ordered today demonstrates Afib rate 72. Otherwise normal. I have personally reviewed and interpreted this study.    Recent Labs: 03/17/2020: B Natriuretic Peptide 417.4; TSH 1.341 03/18/2020: Hemoglobin 12.5; Platelets 172 03/31/2020: Magnesium 2.1 06/01/2020: BUN 16; Creatinine, Ser 0.84; Potassium 5.0; Sodium 140    Lipid Panel    Component Value Date/Time   CHOL 147 01/25/2014 2340   TRIG 63 01/25/2014 2340   HDL 47 01/25/2014 2340   CHOLHDL 3.1 01/25/2014 2340   VLDL 13 01/25/2014 2340   LDLCALC 87 01/25/2014 2340    Dated 06/13/20: Normal CMET and CBC  Wt Readings from Last 3 Encounters:  08/15/20 168 lb (76.2 kg)  07/18/20 169 lb 9.6 oz (76.9 kg)  05/26/20 171 lb 6.4 oz (77.7 kg)      Other studies Reviewed: Additional studies/ records that were reviewed today include:   Echo 03/18/20: IMPRESSIONS    1. Left ventricular ejection fraction, by estimation, is 60 to 65%. The  left ventricle has normal function. The left ventricle has no regional  wall motion abnormalities. There is moderate concentric left ventricular  hypertrophy. Left ventricular  diastolic function could not be evaluated.  2. Right ventricular systolic function is normal. The right ventricular  size is normal. There is moderately elevated pulmonary artery systolic  pressure. The estimated right ventricular systolic pressure is 45.2 mmHg.  3. Left atrial size was severely dilated.  4. Right atrial size was mildly dilated.  5. The mitral valve is  degenerative. Trivial mitral valve regurgitation.  No evidence of mitral stenosis.  6. The aortic valve is tricuspid. There is mild calcification of the  aortic valve. There is mild thickening of the aortic valve. Aortic valve  regurgitation is mild. Mild aortic valve sclerosis is present, with no  evidence of aortic valve stenosis.  7. The inferior vena cava is dilated in size with <50% respiratory  variability, suggesting right atrial pressure of 15 mmHg.   Comparison(s): Changes from prior study are noted. In Afib with RVR. EF  remains the same. RVSP ~45 mmHG on this study  ASSESSMENT AND PLAN:   1.  Atrial fibrillation -persistent and symptomatic - severe LAE - on metoprolol and Cardizem for   rate control.  - on  apixaban for anticoagulation.  - has been on amiodarone 400 mg daily for one month. Will reduce dose to 200 mg daily for her age.  - due to refractory and limiting symptoms I have recommended we attempt to restore NSR. Will arrange for DCCV. - check lytes, LFTs, TSH.    2. Acute on chronic diastolic CHF-+1   Class 3 symptoms Continuemetoprolol, Cardizem Heart healthy low-sodium diet continue support stocking Daily weights-contact office with 3 pound weight gain overnight or 5 pound in 1 week   3. Hypomagnesemia-repleted. Reported chronic diarrhea since summer which is now resolved.    Current medicines are reviewed at length with the patient today.  The patient does not have concerns regarding medicines.  The following changes have been made:  See above  Labs/ tests ordered today include:  No orders of the defined types were placed in this encounter.    Disposition:   FU 2 weeks post DCCV  Signed, Peter [place], MD  08/15/2020 10:39 AM    [facility] [address], [place], [place], [address] Phone [phone], Fax [phone]

## 2020-08-13 ENCOUNTER — Other Ambulatory Visit: Payer: Self-pay | Admitting: General Practice

## 2020-08-15 ENCOUNTER — Other Ambulatory Visit: Payer: Self-pay | Admitting: Cardiology

## 2020-08-15 ENCOUNTER — Other Ambulatory Visit: Payer: Self-pay

## 2020-08-15 ENCOUNTER — Ambulatory Visit: Payer: Medicare PPO | Admitting: Cardiology

## 2020-08-15 ENCOUNTER — Encounter: Payer: Self-pay | Admitting: Cardiology

## 2020-08-15 VITALS — BP 152/80 | HR 65 | Ht 64.5 in | Wt 168.0 lb

## 2020-08-15 DIAGNOSIS — I4819 Other persistent atrial fibrillation: Secondary | ICD-10-CM | POA: Diagnosis not present

## 2020-08-15 DIAGNOSIS — I5033 Acute on chronic diastolic (congestive) heart failure: Secondary | ICD-10-CM

## 2020-08-15 LAB — HEPATIC FUNCTION PANEL
ALT: 19 IU/L (ref 0–32)
AST: 18 IU/L (ref 0–40)
Albumin: 4.2 g/dL (ref 3.5–4.6)
Alkaline Phosphatase: 92 IU/L (ref 44–121)
Bilirubin Total: 0.5 mg/dL (ref 0.0–1.2)
Bilirubin, Direct: 0.14 mg/dL (ref 0.00–0.40)
Total Protein: 7.3 g/dL (ref 6.0–8.5)

## 2020-08-15 LAB — CBC WITH DIFFERENTIAL/PLATELET
Basophils Absolute: 0 10*3/uL (ref 0.0–0.2)
Basos: 1 %
EOS (ABSOLUTE): 0.1 10*3/uL (ref 0.0–0.4)
Eos: 2 %
Hematocrit: 40.5 % (ref 34.0–46.6)
Hemoglobin: 13.5 g/dL (ref 11.1–15.9)
Immature Grans (Abs): 0 10*3/uL (ref 0.0–0.1)
Immature Granulocytes: 0 %
Lymphocytes Absolute: 1.2 10*3/uL (ref 0.7–3.1)
Lymphs: 21 %
MCH: 30 pg (ref 26.6–33.0)
MCHC: 33.3 g/dL (ref 31.5–35.7)
MCV: 90 fL (ref 79–97)
Monocytes Absolute: 0.7 10*3/uL (ref 0.1–0.9)
Monocytes: 11 %
Neutrophils Absolute: 3.9 10*3/uL (ref 1.4–7.0)
Neutrophils: 65 %
Platelets: 203 10*3/uL (ref 150–450)
RBC: 4.5 x10E6/uL (ref 3.77–5.28)
RDW: 13.1 % (ref 11.7–15.4)
WBC: 6 10*3/uL (ref 3.4–10.8)

## 2020-08-15 LAB — BASIC METABOLIC PANEL
BUN/Creatinine Ratio: 28 (ref 12–28)
BUN: 23 mg/dL (ref 10–36)
CO2: 22 mmol/L (ref 20–29)
Calcium: 9.7 mg/dL (ref 8.7–10.3)
Chloride: 100 mmol/L (ref 96–106)
Creatinine, Ser: 0.83 mg/dL (ref 0.57–1.00)
Glucose: 89 mg/dL (ref 65–99)
Potassium: 5.1 mmol/L (ref 3.5–5.2)
Sodium: 136 mmol/L (ref 134–144)
eGFR: 67 mL/min/{1.73_m2} (ref >59)

## 2020-08-15 LAB — MAGNESIUM: Magnesium: 2.2 mg/dL (ref 1.6–2.3)

## 2020-08-15 LAB — TSH: TSH: 1.78 u[IU]/mL (ref 0.450–4.500)

## 2020-08-15 MED ORDER — AMIODARONE HCL 200 MG PO TABS: 200.0000 mg | ORAL_TABLET | Freq: Every day | ORAL | 3 refills | Status: DC

## 2020-08-15 NOTE — Patient Instructions (Addendum)
Reduce amiodarone to 200 mg daily   Electrical Cardioversion Electrical cardioversion is the delivery of a jolt of electricity to restore a normal rhythm to the heart. A rhythm that is too fast or is not regular keeps the heart from pumping well. In this procedure, sticky patches or metal paddles are placed on the chest to deliver electricity to the heart from a device. This procedure may be done in an emergency if:  There is low or no blood pressure as a result of the heart rhythm.  Normal rhythm must be restored as fast as possible to protect the brain and heart from further damage.  It may save a life. This may also be a scheduled procedure for irregular or fast heart rhythms that are not immediately life-threatening. Tell a health care provider about:  Any allergies you have.  All medicines you are taking, including vitamins, herbs, eye drops, creams, and over-the-counter medicines.  Any problems you or family members have had with anesthetic medicines.  Any blood disorders you have.  Any surgeries you have had.  Any medical conditions you have.  Whether you are pregnant or may be pregnant. What are the risks? Generally, this is a safe procedure. However, problems may occur, including:  Allergic reactions to medicines.  A blood clot that breaks free and travels to other parts of your body.  The possible return of an abnormal heart rhythm within hours or days after the procedure.  Your heart stopping (cardiac arrest). This is rare. What happens before the procedure? Medicines  Your health care provider may have you start taking: ? Blood-thinning medicines (anticoagulants) so your blood does not clot as easily. ? Medicines to help stabilize your heart rate and rhythm.  Ask your health care provider about: ? Changing or stopping your regular medicines. This is especially important if you are taking diabetes medicines or blood thinners. ? Taking medicines such as  aspirin and ibuprofen. These medicines can thin your blood. Do not take these medicines unless your health care provider tells you to take them. ? Taking over-the-counter medicines, vitamins, herbs, and supplements. General instructions  Follow instructions from your health care provider about eating or drinking restrictions.  Plan to have someone take you home from the hospital or clinic.  If you will be going home right after the procedure, plan to have someone with you for 24 hours.  Ask your health care provider what steps will be taken to help prevent infection. These may include washing your skin with a germ-killing soap. What happens during the procedure?  An IV will be inserted into one of your veins.  Sticky patches (electrodes) or metal paddles may be placed on your chest.  You will be given a medicine to help you relax (sedative).  An electrical shock will be delivered. The procedure may vary among health care providers and hospitals.   What can I expect after the procedure?  Your blood pressure, heart rate, breathing rate, and blood oxygen level will be monitored until you leave the hospital or clinic.  Your heart rhythm will be watched to make sure it does not change.  You may have some redness on the skin where the shocks were given. Follow these instructions at home:  Do not drive for 24 hours if you were given a sedative during your procedure.  Take over-the-counter and prescription medicines only as told by your health care provider.  Ask your health care provider how to check your pulse. Check it often.  Rest for 48 hours after the procedure or as told by your health care provider.  Avoid or limit your caffeine use as told by your health care provider.  Keep all follow-up visits as told by your health care provider. This is important. Contact a health care provider if:  You feel like your heart is beating too quickly or your pulse is not regular.  You  have a serious muscle cramp that does not go away. Get help right away if:  You have discomfort in your chest.  You are dizzy or you feel faint.  You have trouble breathing or you are short of breath.  Your speech is slurred.  You have trouble moving an arm or leg on one side of your body.  Your fingers or toes turn cold or blue. Summary  Electrical cardioversion is the delivery of a jolt of electricity to restore a normal rhythm to the heart.  This procedure may be done right away in an emergency or may be a scheduled procedure if the condition is not an emergency.  Generally, this is a safe procedure.  After the procedure, check your pulse often as told by your health care provider. This information is not intended to replace advice given to you by your health care provider. Make sure you discuss any questions you have with your health care provider. Document Revised: 12/08/2018 Document Reviewed: 12/08/2018 Elsevier Patient Education  2021 Elsevier Inc.       You are scheduled for a Cardioversion on Thursday 08/25/20 with Dr.Schumann.  Please arrive at the [facility] (Main Entrance A) at [facility]: [address] [place], [place] [address] at 9:30 am.  DIET: Nothing to eat or drink after midnight except a sip of water with medications (see medication instructions below)  Medication Instructions:   Continue your anticoagulant: Eliquis You will need to continue your anticoagulant after your procedure until you  are told by your  Provider that it is safe to stop   Labs: Today at Dr.Ande Therrell's office   [facility]    [address]     Monday 08/22/20 at 11:20 am  Quarantine until after cardioversion  You must have a responsible person to drive you home and stay in the waiting area during your procedure. Failure to do so could result in cancellation.  Bring your insurance cards.  *Special Note: Every effort is made to have your procedure done on  time. Occasionally there are emergencies that occur at the hospital that may cause delays. Please be patient if a delay does occur.

## 2020-08-15 NOTE — Addendum Note (Signed)
Addended by: Neoma Laming on: 08/15/2020 10:56 AM   Modules accepted: Orders

## 2020-08-22 ENCOUNTER — Other Ambulatory Visit (HOSPITAL_COMMUNITY)
Admission: RE | Admit: 2020-08-22 | Discharge: 2020-08-22 | Disposition: A | Discharge status: Home / Self Care | Payer: Medicare PPO | Source: Ambulatory Visit | Attending: Cardiology | Admitting: Cardiology

## 2020-08-22 DIAGNOSIS — Z01812 Encounter for preprocedural laboratory examination: Secondary | ICD-10-CM | POA: Insufficient documentation

## 2020-08-22 DIAGNOSIS — Z20822 Contact with and (suspected) exposure to covid-19: Secondary | ICD-10-CM | POA: Diagnosis not present

## 2020-08-22 LAB — SARS CORONAVIRUS 2 (TAT 6-24 HRS): SARS Coronavirus 2: NEGATIVE

## 2020-08-24 ENCOUNTER — Telehealth: Payer: Self-pay | Admitting: Cardiology

## 2020-08-24 NOTE — Telephone Encounter (Signed)
Patient would like to know if she needs to hold her Metoprolol for her procedure tomorrow, 08/25/20 with Dr. Bjorn Pippin. She requested to speak with Elnita Maxwell specifically, but I informed her Elnita Maxwell is off today. She understood; she is willing to speak with anyone to go over her instructions.

## 2020-08-24 NOTE — Telephone Encounter (Signed)
Returned the call to the patient. All questions were answered. Verified that the patient has not missed any doses of her Eliquis.

## 2020-08-25 ENCOUNTER — Other Ambulatory Visit: Payer: Self-pay

## 2020-08-25 ENCOUNTER — Ambulatory Visit (HOSPITAL_COMMUNITY)
Admission: RE | Admit: 2020-08-25 | Discharge: 2020-08-25 | Disposition: A | Discharge status: Home / Self Care | Payer: Medicare PPO | Attending: Cardiology | Admitting: Cardiology

## 2020-08-25 ENCOUNTER — Ambulatory Visit (HOSPITAL_COMMUNITY): Payer: Medicare PPO | Admitting: Anesthesiology

## 2020-08-25 ENCOUNTER — Encounter (HOSPITAL_COMMUNITY)
Admission: RE | Disposition: A | Discharge status: Home / Self Care | Payer: Self-pay | Source: Home / Self Care | Attending: Cardiology

## 2020-08-25 DIAGNOSIS — M1990 Unspecified osteoarthritis, unspecified site: Secondary | ICD-10-CM | POA: Diagnosis not present

## 2020-08-25 DIAGNOSIS — Z87891 Personal history of nicotine dependence: Secondary | ICD-10-CM | POA: Diagnosis not present

## 2020-08-25 DIAGNOSIS — I4819 Other persistent atrial fibrillation: Secondary | ICD-10-CM | POA: Diagnosis not present

## 2020-08-25 DIAGNOSIS — I341 Nonrheumatic mitral (valve) prolapse: Secondary | ICD-10-CM | POA: Diagnosis not present

## 2020-08-25 DIAGNOSIS — Z79899 Other long term (current) drug therapy: Secondary | ICD-10-CM | POA: Diagnosis not present

## 2020-08-25 DIAGNOSIS — I471 Supraventricular tachycardia: Secondary | ICD-10-CM | POA: Diagnosis not present

## 2020-08-25 DIAGNOSIS — I5033 Acute on chronic diastolic (congestive) heart failure: Secondary | ICD-10-CM | POA: Insufficient documentation

## 2020-08-25 DIAGNOSIS — I4891 Unspecified atrial fibrillation: Secondary | ICD-10-CM | POA: Diagnosis not present

## 2020-08-25 DIAGNOSIS — Z7901 Long term (current) use of anticoagulants: Secondary | ICD-10-CM | POA: Insufficient documentation

## 2020-08-25 HISTORY — PX: CARDIOVERSION: SHX1299

## 2020-08-25 SURGERY — CARDIOVERSION
Anesthesia: General

## 2020-08-25 MED ORDER — SODIUM CHLORIDE 0.9 % IV SOLN: INTRAVENOUS | Status: DC | PRN

## 2020-08-25 MED ORDER — PROPOFOL 10 MG/ML IV BOLUS
INTRAVENOUS | Status: DC | PRN
  Administered 2020-08-25: 15 mg via INTRAVENOUS
  Administered 2020-08-25: 40 mg via INTRAVENOUS

## 2020-08-25 MED ORDER — LIDOCAINE 2% (20 MG/ML) 5 ML SYRINGE
INTRAMUSCULAR | Status: DC | PRN
  Administered 2020-08-25: 60 mg via INTRAVENOUS

## 2020-08-25 NOTE — Anesthesia Preprocedure Evaluation (Addendum)
Anesthesia Evaluation  Patient identified by MRN, date of birth, ID band Patient awake    Reviewed: Allergy & Precautions, NPO status , Patient's Chart, lab work & pertinent test results, reviewed documented beta blocker date and time   Airway Mallampati: III  TM Distance: >3 FB Neck ROM: Full    Dental  (+) Dental Advisory Given, Teeth Intact   Pulmonary former smoker,    Pulmonary exam normal breath sounds clear to auscultation       Cardiovascular hypertension, Pt. on medications and Pt. on home beta blockers (-) angina+ DOE  (-) Past MI + dysrhythmias Atrial Fibrillation and Supra Ventricular Tachycardia + Valvular Problems/Murmurs MVP  Rhythm:Irregular Rate:Abnormal  Echo 03/18/20: 1. Left ventricular ejection fraction, by estimation, is 60 to 65%. The  left ventricle has normal function. The left ventricle has no regional  wall motion abnormalities. There is moderate concentric left ventricular  hypertrophy. Left ventricular  diastolic function could not be evaluated.  2. Right ventricular systolic function is normal. The right ventricular  size is normal. There is moderately elevated pulmonary artery systolic  pressure. The estimated right ventricular systolic pressure is 45.2 mmHg.  3. Left atrial size was severely dilated.  4. Right atrial size was mildly dilated.  5. The mitral valve is degenerative. Trivial mitral valve regurgitation.  No evidence of mitral stenosis.  6. The aortic valve is tricuspid. There is mild calcification of the  aortic valve. There is mild thickening of the aortic valve. Aortic valve  regurgitation is mild. Mild aortic valve sclerosis is present, with no  evidence of aortic valve stenosis.  7. The inferior vena cava is dilated in size with <50% respiratory  variability, suggesting right atrial pressure of 15 mmHg.    Neuro/Psych negative neurological ROS     GI/Hepatic Neg liver  ROS, GERD  Medicated,  Endo/Other  negative endocrine ROS  Renal/GU negative Renal ROS     Musculoskeletal  (+) Arthritis ,   Abdominal   Peds  Hematology  (+) Blood dyscrasia (Eliquis), ,   Anesthesia Other Findings Day of surgery medications reviewed with the patient.  Reproductive/Obstetrics                            Anesthesia Physical Anesthesia Plan  ASA: III  Anesthesia Plan: General   Post-op Pain Management:    Induction: Intravenous  PONV Risk Score and Plan: 3 and TIVA and Treatment may vary due to age or medical condition  Airway Management Planned: Mask  Additional Equipment:   Intra-op Plan:   Post-operative Plan:   Informed Consent: I have reviewed the patients History and Physical, chart, labs and discussed the procedure including the risks, benefits and alternatives for the proposed anesthesia with the patient or authorized representative who has indicated his/her understanding and acceptance.     Dental advisory given  Plan Discussed with: CRNA  Anesthesia Plan Comments:         Anesthesia Quick Evaluation

## 2020-08-25 NOTE — Anesthesia Procedure Notes (Signed)
Procedure Name: MAC Date/Time: 08/25/2020 11:00 AM Performed by: Alain Marion, CRNA Pre-anesthesia Checklist: Patient identified, Emergency Drugs available, Suction available, Patient being monitored and Timeout performed Patient Re-evaluated:Patient Re-evaluated prior to induction Oxygen Delivery Method: Ambu bag Placement Confirmation: positive ETCO2

## 2020-08-25 NOTE — Interval H&P Note (Signed)
History and Physical Interval Note:  08/25/2020 10:47 AM  Regina Taylor  has presented today for surgery, with the diagnosis of AFIB.  The various methods of treatment have been discussed with the patient and family. After consideration of risks, benefits and other options for treatment, the patient has consented to  Procedure(s): CARDIOVERSION (N/A) as a surgical intervention.  The patient's history has been reviewed, patient examined, no change in status, stable for surgery.  I have reviewed the patient's chart and labs.  Questions were answered to the patient's satisfaction.     Little Ishikawa

## 2020-08-25 NOTE — Discharge Instructions (Signed)

## 2020-08-25 NOTE — Transfer of Care (Signed)
Immediate Anesthesia Transfer of Care Note  Patient: Regina Taylor  Procedure(s) Performed: CARDIOVERSION (N/A )  Patient Location: Endoscopy Unit  Anesthesia Type:MAC  Level of Consciousness: awake, alert  and oriented  Airway & Oxygen Therapy: Patient Spontanous Breathing  Post-op Assessment: Report given to RN and Post -op Vital signs reviewed and stable  Post vital signs: Reviewed, stable  Last Vitals:  Vitals Value Taken Time  BP 153/54   Temp    Pulse 56 08/25/20 1118  Resp 15 08/25/20 1118  SpO2 96 % 08/25/20 1118    Last Pain:  Vitals:   08/25/20 0939  TempSrc: Tympanic  PainSc: 0-No pain         Complications: No complications documented.

## 2020-08-25 NOTE — CV Procedure (Addendum)
Procedure:   DCCV  Indication:  Symptomatic atrial fibrillation  Procedure Note:  The patient signed informed consent.  They have had had therapeutic anticoagulation with Eliquis greater than 3 weeks.  Anesthesia was administered by Dr. Desmond Lope.  Adequate airway was maintained throughout and vital followed per protocol.  They were cardioverted x 1 with 200J of biphasic synchronized energy.  They converted to NSR with rate 50s-60s.  There were no apparent complications.  The patient had normal neuro status and respiratory status post procedure with vitals stable as recorded elsewhere.    Follow up:  They will continue on current medical therapy and follow up with cardiology as scheduled.  Epifanio Lesches, MD 08/25/2020 11:20 AM

## 2020-08-25 NOTE — Anesthesia Postprocedure Evaluation (Signed)
Anesthesia Post Note  Patient: Regina Taylor  Procedure(s) Performed: CARDIOVERSION (N/A )     Patient location during evaluation: Endoscopy Anesthesia Type: General Level of consciousness: awake and alert Pain management: pain level controlled Vital Signs Assessment: post-procedure vital signs reviewed and stable Respiratory status: spontaneous breathing, nonlabored ventilation and respiratory function stable Cardiovascular status: blood pressure returned to baseline and stable Postop Assessment: no apparent nausea or vomiting Anesthetic complications: no   No complications documented.  Last Vitals:  Vitals:   08/25/20 1131 08/25/20 1141  BP: (!) 158/63 (!) 158/64  Pulse: 64 61  Resp: 16 (!) 24  Temp:    SpO2: 98% 99%    Last Pain:  Vitals:   08/25/20 1141  TempSrc:   PainSc: 0-No pain                 Cecile Hearing

## 2020-08-28 ENCOUNTER — Encounter (HOSPITAL_COMMUNITY): Payer: Self-pay | Admitting: Cardiology

## 2020-09-19 NOTE — Progress Notes (Signed)
Cardiology Office Note   Date:  09/22/2020   ID:  Regina Taylor, DOB [birth year], MRN [id number]  PCP:  Merri Brunette, MD  Cardiologist:   Sherry Rogus [place], MD   Chief Complaint  Patient presents with  . Atrial Fibrillation  . Congestive Heart Failure      History of Present Illness: Regina Taylor is a 87 y.o. female who presents for follow up Afib. She has a PMH of SVT, GERD, atrial fibrillation, hyponatremia, diastolic CHF, and glaucoma.  She was  admitted to the hospital 03/17/2020.She presented to the emergency department with shortness of breath and orthopnea.  She  was found to have A. fib with RVR. Her echocardiogram showed an EF of 60-65%, moderate LVH, mild MR, AR, mild aortic valve sclerosis, mild pulmonary hypertension and severe LAE. She was placed on Eliquis 5 mg twice daily, and rate control with Cardizem and  Metoprolol. She was diuresed but not continued on diuretics.   She was seen in January with some increased swelling. Given lasix for 3 days and instructed on low sodium diet. She has been using support hose.   When seen one month ago she continued to have significant symptoms of dyspnea with any exertion. Is constantly SOB. Was able to walk her dog before but no longer can. Still has some swelling in her legs. Occasional lightheadedness. No chest pain. Has had swallowing evaluation with some dysmotility noted. After discussion of treatment options she was started on amiodarone- currently on 200 mg daily.  She subsequently underwent elective DCCV on 08/25/20.   On follow up today she notes she feels better. She has started walking her dog again. Can tell her breathing is better. Energy level is OK. Tolerating medication well.     Past Medical History:  Diagnosis Date  . Arthritis   . Blindness of left eye   . Fatigue   . GERD (gastroesophageal reflux disease)   . Glaucoma   . Macular degeneration   . MVP (mitral valve prolapse)   . Palpitations   . SVT  (supraventricular tachycardia) (HCC)     Past Surgical History:  Procedure Laterality Date  . CARDIOVERSION N/A 08/25/2020   Procedure: CARDIOVERSION;  Surgeon: Little Ishikawa, MD;  Location: [facility] ENDOSCOPY;  Service: Cardiovascular;  Laterality: N/A;  Leonard Downing    . CATARACT EXTRACTION    . PARTIAL HIP ARTHROPLASTY    . RETINAL DETACHMENT SURGERY    . TOTAL HIP ARTHROPLASTY Right 1990     Current Outpatient Medications  Medication Sig Dispense Refill  . amiodarone (PACERONE) 200 MG tablet Take 1 tablet (200 mg total) by mouth daily. 180 tablet 3  . apixaban (ELIQUIS) 5 MG TABS tablet Take 5 mg by mouth 2 (two) times daily. 1 tablet twice Daily    . Artificial Tear Solution (BION TEARS OP) Apply 1 drop to eye daily as needed (dry eyes).    Marland Kitchen diltiazem (CARDIZEM LA) 240 MG 24 hr tablet Take 240 mg by mouth daily. 1 Daily    . hypromellose (GENTEAL) 0.3 % GEL ophthalmic ointment Place 1 drop into the right eye at bedtime.    . Metoprolol Succinate 50 MG CS24 Take by mouth.    . Multiple Vitamins-Minerals (ICAPS PO) Take 1 capsule by mouth daily.    . pantoprazole (PROTONIX) 40 MG tablet Take 40 mg by mouth daily as needed (reflux).    . Red Yeast Rice Extract (RED YEAST RICE PO) Take 1 tablet by mouth  daily.    Marland Kitchen TIMOPTIC OCUDOSE 0.5 % SOLN Place 1 drop into the right eye daily.      No current facility-administered medications for this visit.    Allergies:   Shellfish-derived products, Iodinated diagnostic agents, Latanoprost, and Xalatan    Social History:  The patient  reports that she quit smoking about 41 years ago. She has never used smokeless tobacco. She reports current alcohol use. She reports that she does not use drugs.   Family History:  The patient's family history includes Cancer in her mother; Heart attack in her brother; Heart disease in her father.    ROS:  Please see the history of present illness.   Otherwise, review of systems are positive for none.   All  other systems are reviewed and negative.    PHYSICAL EXAM: VS:  BP 128/74   Pulse (!) 51   Ht 5' 4.5" (1.638 m)   Wt 164 lb (74.4 kg)   SpO2 97%   BMI 27.72 kg/m  , BMI Body mass index is 27.72 kg/m. GEN: Well nourished, well developed, in no acute distress  HEENT: normal  Neck: no JVD, carotid bruits, or masses Cardiac: IRRR; no murmurs, rubs, or gallops, 1+ edema - support stockings in place.  Respiratory:  clear to auscultation bilaterally, normal work of breathing GI: soft, nontender, nondistended, + BS MS: no deformity or atrophy  Skin: warm and dry, no rash Neuro:  Strength and sensation are intact Psych: euthymic mood, full affect   EKG:  EKG is ordered today. The ekg ordered today demonstrates NSR rate 51,  Otherwise normal. I have personally reviewed and interpreted this study.    Recent Labs: 03/17/2020: B Natriuretic Peptide 417.4 08/15/2020: ALT 19; BUN 23; Creatinine, Ser 0.83; Hemoglobin 13.5; Magnesium 2.2; Platelets 203; Potassium 5.1; Sodium 136; TSH 1.780    Lipid Panel    Component Value Date/Time   CHOL 147 01/25/2014 2340   TRIG 63 01/25/2014 2340   HDL 47 01/25/2014 2340   CHOLHDL 3.1 01/25/2014 2340   VLDL 13 01/25/2014 2340   LDLCALC 87 01/25/2014 2340    Dated 06/13/20: Normal CMET and CBC  Wt Readings from Last 3 Encounters:  09/22/20 164 lb (74.4 kg)  08/25/20 168 lb (76.2 kg)  08/15/20 168 lb (76.2 kg)      Other studies Reviewed: Additional studies/ records that were reviewed today include:   Echo 03/18/20: IMPRESSIONS    1. Left ventricular ejection fraction, by estimation, is 60 to 65%. The  left ventricle has normal function. The left ventricle has no regional  wall motion abnormalities. There is moderate concentric left ventricular  hypertrophy. Left ventricular  diastolic function could not be evaluated.  2. Right ventricular systolic function is normal. The right ventricular  size is normal. There is moderately  elevated pulmonary artery systolic  pressure. The estimated right ventricular systolic pressure is 45.2 mmHg.  3. Left atrial size was severely dilated.  4. Right atrial size was mildly dilated.  5. The mitral valve is degenerative. Trivial mitral valve regurgitation.  No evidence of mitral stenosis.  6. The aortic valve is tricuspid. There is mild calcification of the  aortic valve. There is mild thickening of the aortic valve. Aortic valve  regurgitation is mild. Mild aortic valve sclerosis is present, with no  evidence of aortic valve stenosis.  7. The inferior vena cava is dilated in size with <50% respiratory  variability, suggesting right atrial pressure of 15 mmHg.  Comparison(s): Changes from prior study are noted. In Afib with RVR. EF  remains the same. RVSP ~45 mmHG on this study  ASSESSMENT AND PLAN:   1.  Atrial fibrillation -persistent and symptomatic - severe LAE - on metoprolol and Cardizem for rate control.  - on  apixaban for anticoagulation.  - has been on amiodarone  200 mg daily for her age.  - due to refractory and limiting symptoms she underwent DCCV on 4/7//22 - maintaining NSR for now.  - continue current therapy. Follow up in 3-4 months with CMET and TSH.  2. Acute on chronic diastolic CHF-   Improved class 2 symptoms. Continuemetoprolol, Cardizem Heart healthy low-sodium diet continue support stocking Daily weights   3. Hypomagnesemia-repleted. Reported chronic diarrhea since summer which is now resolved.    Current medicines are reviewed at length with the patient today.  The patient does not have concerns regarding medicines.  The following changes have been made:  See above  Labs/ tests ordered today include:   Orders Placed This Encounter  Procedures  . Comprehensive Metabolic Panel (CMET)  . TSH     Disposition:   FU 3-4 months  Signed, Jenai Scaletta [place], MD  09/22/2020 10:26 AM    [facility] [address], [place], [place], [address] Phone [phone], Fax [phone]

## 2020-09-22 ENCOUNTER — Encounter: Payer: Self-pay | Admitting: Cardiology

## 2020-09-22 ENCOUNTER — Other Ambulatory Visit: Payer: Self-pay

## 2020-09-22 ENCOUNTER — Ambulatory Visit: Payer: Medicare PPO | Admitting: Cardiology

## 2020-09-22 VITALS — BP 128/74 | HR 51 | Ht 64.5 in | Wt 164.0 lb

## 2020-09-22 DIAGNOSIS — I4819 Other persistent atrial fibrillation: Secondary | ICD-10-CM

## 2020-09-22 DIAGNOSIS — I5030 Unspecified diastolic (congestive) heart failure: Secondary | ICD-10-CM | POA: Diagnosis not present

## 2020-09-22 NOTE — Addendum Note (Signed)
Addended by: Neoma Laming on: 09/22/2020 10:40 AM   Modules accepted: Orders

## 2020-11-11 DIAGNOSIS — M25552 Pain in left hip: Secondary | ICD-10-CM | POA: Diagnosis not present

## 2020-11-29 DIAGNOSIS — M25552 Pain in left hip: Secondary | ICD-10-CM | POA: Diagnosis not present

## 2020-12-01 ENCOUNTER — Other Ambulatory Visit: Payer: Self-pay | Admitting: Cardiology

## 2020-12-01 DIAGNOSIS — M25552 Pain in left hip: Secondary | ICD-10-CM | POA: Insufficient documentation

## 2020-12-12 ENCOUNTER — Other Ambulatory Visit: Payer: Self-pay | Admitting: Cardiology

## 2020-12-13 ENCOUNTER — Telehealth: Payer: Self-pay | Admitting: Cardiology

## 2020-12-13 ENCOUNTER — Other Ambulatory Visit: Payer: Self-pay | Admitting: Cardiology

## 2020-12-14 ENCOUNTER — Telehealth: Payer: Self-pay | Admitting: Cardiology

## 2020-12-14 MED ORDER — DILTIAZEM HCL ER COATED BEADS 240 MG PO TB24: 240.0000 mg | ORAL_TABLET | Freq: Every day | ORAL | 1 refills | Status: DC

## 2020-12-14 MED ORDER — METOPROLOL SUCCINATE 50 MG PO CS24: 1.0000 | EXTENDED_RELEASE_CAPSULE | Freq: Every day | ORAL | 1 refills | Status: DC

## 2020-12-14 NOTE — Telephone Encounter (Signed)
Let patient know that refills has been sent to [facility].

## 2020-12-14 NOTE — Telephone Encounter (Signed)
*  STAT* If patient is at the pharmacy, call can be transferred to refill team.   1. Which medications need to be refilled? (please list name of each medication and dose if known) diltiazem (CARDIZEM LA) 240 MG 24 hr tablet Metoprolol Succinate 50 MG CS24  2. Which pharmacy/location (including street and city if local pharmacy) is medication to be sent to? [facility] [place], [place] - [address]  3. Do they need a 30 day or 90 day supply? 90  Patient states that we denied it because I provider to write the prescription. Patient states she was giving the prescription while she was in the hospital.

## 2020-12-15 MED ORDER — DILTIAZEM HCL ER COATED BEADS 240 MG PO CP24: 240.0000 mg | ORAL_CAPSULE | Freq: Every day | ORAL | 3 refills | Status: DC

## 2020-12-15 NOTE — Telephone Encounter (Signed)
Pt c/o medication issue: 1. Name of Medication: Metoprolol and diltiazem 2. How are you currently taking this medication (dosage and times per day)? Once a day  3. Are you having a reaction (difficulty breathing--STAT)?  No  4. What is your medication issue? Patient states this medication was sent over but the pharmacy do not have this in stock they told the patient. Please advise.

## 2020-12-15 NOTE — Telephone Encounter (Signed)
Tried to call pt and phone just keeps ringing Call and spoke with son and he states to call pharmacy to discuss Called [facility]-they will fill today all questions answered.

## 2020-12-16 ENCOUNTER — Other Ambulatory Visit: Payer: Self-pay

## 2020-12-16 MED ORDER — TOPROL XL 50 MG PO TB24: 50.0000 mg | ORAL_TABLET | Freq: Every day | ORAL | 3 refills | Status: DC

## 2020-12-16 MED ORDER — DILTIAZEM HCL ER COATED BEADS 240 MG PO CP24: 240.0000 mg | ORAL_CAPSULE | Freq: Every day | ORAL | 3 refills | Status: DC

## 2020-12-22 DIAGNOSIS — M25552 Pain in left hip: Secondary | ICD-10-CM | POA: Diagnosis not present

## 2020-12-28 DIAGNOSIS — M25552 Pain in left hip: Secondary | ICD-10-CM | POA: Diagnosis not present

## 2021-01-02 NOTE — Telephone Encounter (Signed)
I would recommend reducing amiodarone to 100 mg daily. We can reassess at the time of her visit on the 22nd and see if symptoms any better.  Treyden Hakim [place] MD, [facility]

## 2021-01-03 ENCOUNTER — Other Ambulatory Visit: Payer: Self-pay

## 2021-01-03 NOTE — Telephone Encounter (Signed)
Spoke to patient Dr.Jordan advised to decrease Amiodarone to 100 mg daily.She stated she has a 200 mg tablet.She will take 1/2 tablet daily.Advised to keep appointment with Dr.Jordan Mon 8/22 at 11:40 am.

## 2021-01-05 DIAGNOSIS — I4819 Other persistent atrial fibrillation: Secondary | ICD-10-CM | POA: Diagnosis not present

## 2021-01-05 DIAGNOSIS — I5030 Unspecified diastolic (congestive) heart failure: Secondary | ICD-10-CM | POA: Diagnosis not present

## 2021-01-06 LAB — COMPREHENSIVE METABOLIC PANEL
ALT: 24 IU/L (ref 0–32)
AST: 18 IU/L (ref 0–40)
Albumin/Globulin Ratio: 1.3 (ref 1.2–2.2)
Albumin: 3.8 g/dL (ref 3.5–4.6)
Alkaline Phosphatase: 96 IU/L (ref 44–121)
BUN/Creatinine Ratio: 14 (ref 12–28)
BUN: 13 mg/dL (ref 10–36)
Bilirubin Total: 0.3 mg/dL (ref 0.0–1.2)
CO2: 23 mmol/L (ref 20–29)
Calcium: 9.3 mg/dL (ref 8.7–10.3)
Chloride: 99 mmol/L (ref 96–106)
Creatinine, Ser: 0.94 mg/dL (ref 0.57–1.00)
Globulin, Total: 3 g/dL (ref 1.5–4.5)
Glucose: 82 mg/dL (ref 65–99)
Potassium: 4.8 mmol/L (ref 3.5–5.2)
Sodium: 136 mmol/L (ref 134–144)
Total Protein: 6.8 g/dL (ref 6.0–8.5)
eGFR: 57 mL/min/{1.73_m2} — ABNORMAL LOW (ref >59)

## 2021-01-06 LAB — TSH: TSH: 0.77 u[IU]/mL (ref 0.450–4.500)

## 2021-01-06 NOTE — Progress Notes (Signed)
Cardiology Office Note   Date:  01/09/2021   ID:  Regina Taylor, Regina Taylor July 10, 1927, MRN [id number]  PCP:  Merri Brunette, MD  Cardiologist:   Arlynn Stare [place], MD   No chief complaint on file.     History of Present Illness: LOUGENIA MORRISSEY is a 87 y.o. female who presents for follow up Afib. She has a PMH of SVT, GERD, atrial fibrillation, hyponatremia, diastolic CHF, and glaucoma.   She was  admitted to the hospital 03/17/2020. She presented to the emergency department with shortness of breath and orthopnea.    She  was found to have A. fib with RVR.  Her echocardiogram showed an EF of 60-65%, moderate LVH, mild MR, AR, mild aortic valve sclerosis, mild pulmonary hypertension and severe LAE.  She was placed on Eliquis 5 mg twice daily, and rate control with Cardizem and  Metoprolol. She was diuresed but not continued on diuretics.   She was seen in January with some increased swelling. Given lasix for 3 days and instructed on low sodium diet. She has been using support hose.   She continued to have significant symptoms of dyspnea with any exertion. Is constantly SOB. Was able to walk her dog before but no longer can. Still has some swelling in her legs. Occasional lightheadedness. No chest pain. Has had swallowing evaluation with some dysmotility noted. After discussion of treatment options she was started on amiodarone- currently on 200 mg daily.  She subsequently underwent elective DCCV on 08/25/20.   On follow up today she notes she complains of  upper abdominal tightness and "ickiness". Some nausea, poor  appetite.  She has started walking her dog again. Still has some SOB.  Energy level is OK.     Past Medical History:  Diagnosis Date   Arthritis    Blindness of left eye    Fatigue    GERD (gastroesophageal reflux disease)    Glaucoma    Macular degeneration    MVP (mitral valve prolapse)    Palpitations    SVT (supraventricular tachycardia) (HCC)     Past Surgical History:   Procedure Laterality Date   CARDIOVERSION N/A 08/25/2020   Procedure: CARDIOVERSION;  Surgeon: Little Ishikawa, MD;  Location: [facility] ENDOSCOPY;  Service: Cardiovascular;  Laterality: N/A;   CATARAC     CATARACT EXTRACTION     PARTIAL HIP ARTHROPLASTY     RETINAL DETACHMENT SURGERY     TOTAL HIP ARTHROPLASTY Right 1990     Current Outpatient Medications  Medication Sig Dispense Refill   amiodarone (PACERONE) 200 MG tablet 100 mg. Take 1/2 tablet ( 100 mg ) daily 90 tablet 3   apixaban (ELIQUIS) 5 MG TABS tablet Take 5 mg by mouth 2 (two) times daily. 1 tablet twice Daily     Artificial Tear Solution (BION TEARS OP) Apply 1 drop to eye daily as needed (dry eyes).     diltiazem (CARDIZEM CD) 240 MG 24 hr capsule Take 1 capsule (240 mg total) by mouth daily. 90 capsule 3   hypromellose (GENTEAL) 0.3 % GEL ophthalmic ointment Place 1 drop into the right eye at bedtime.     Multiple Vitamins-Minerals (ICAPS PO) Take 1 capsule by mouth daily.     Red Yeast Rice Extract (RED YEAST RICE PO) Take 1 tablet by mouth daily.     TIMOPTIC OCUDOSE 0.5 % SOLN Place 1 drop into the right eye daily.      TOPROL XL 50 MG 24 hr  tablet Take 1 tablet (50 mg total) by mouth daily. Take with or immediately following a meal. 90 tablet 3   pantoprazole (PROTONIX) 40 MG tablet Take 40 mg by mouth daily as needed (reflux). (Patient not taking: Reported on 01/09/2021)     No current facility-administered medications for this visit.    Allergies:   Shellfish-derived products, Iodinated diagnostic agents, Latanoprost, and Xalatan    Social History:  The patient  reports that she quit smoking about 41 years ago. Her smoking use included cigarettes. She has never used smokeless tobacco. She reports current alcohol use. She reports that she does not use drugs.   Family History:  The patient's family history includes Cancer in her mother; Heart attack in her brother; Heart disease in her father.    ROS:  Please  see the history of present illness.   Otherwise, review of systems are positive for none.   All other systems are reviewed and negative.    PHYSICAL EXAM: VS:  BP (!) 160/66 (BP Location: Left Arm)   Pulse 63   Ht 5\' 5"  (1.651 m)   Wt 160 lb 12.8 oz (72.9 kg)   SpO2 98%   BMI 26.76 kg/m  , BMI Body mass index is 26.76 kg/m. GEN: Well nourished, well developed, in no acute distress  HEENT: normal  Neck: no JVD, carotid bruits, or masses Cardiac: IRRR; no murmurs, rubs, or gallops, 1+ edema - support stockings in place.  Respiratory:  clear to auscultation bilaterally, normal work of breathing GI: soft, nontender, nondistended, + BS MS: no deformity or atrophy  Skin: warm and dry, no rash Neuro:  Strength and sensation are intact Psych: euthymic mood, full affect   EKG:  EKG is not ordered today.     Recent Labs: 03/17/2020: B Natriuretic Peptide 417.4 08/15/2020: Hemoglobin 13.5; Magnesium 2.2; Platelets 203 01/05/2021: ALT 24; BUN 13; Creatinine, Ser 0.94; Potassium 4.8; Sodium 136; TSH 0.770    Lipid Panel    Component Value Date/Time   CHOL 147 01/25/2014 2340   TRIG 63 01/25/2014 2340   HDL 47 01/25/2014 2340   CHOLHDL 3.1 01/25/2014 2340   VLDL 13 01/25/2014 2340   LDLCALC 87 01/25/2014 2340    Dated 06/13/20: Normal CMET and CBC  Wt Readings from Last 3 Encounters:  01/09/21 160 lb 12.8 oz (72.9 kg)  09/22/20 164 lb (74.4 kg)  08/25/20 168 lb (76.2 kg)      Other studies Reviewed: Additional studies/ records that were reviewed today include:   Echo 03/18/20: IMPRESSIONS     1. Left ventricular ejection fraction, by estimation, is 60 to 65%. The  left ventricle has normal function. The left ventricle has no regional  wall motion abnormalities. There is moderate concentric left ventricular  hypertrophy. Left ventricular  diastolic function could not be evaluated.   2. Right ventricular systolic function is normal. The right ventricular  size is  normal. There is moderately elevated pulmonary artery systolic  pressure. The estimated right ventricular systolic pressure is 45.2 mmHg.   3. Left atrial size was severely dilated.   4. Right atrial size was mildly dilated.   5. The mitral valve is degenerative. Trivial mitral valve regurgitation.  No evidence of mitral stenosis.   6. The aortic valve is tricuspid. There is mild calcification of the  aortic valve. There is mild thickening of the aortic valve. Aortic valve  regurgitation is mild. Mild aortic valve sclerosis is present, with no  evidence of aortic  valve stenosis.   7. The inferior vena cava is dilated in size with <50% respiratory  variability, suggesting right atrial pressure of 15 mmHg.   Comparison(s): Changes from prior study are noted. In Afib with RVR. EF  remains the same. RVSP ~45 mmHG on this study  ASSESSMENT AND PLAN:   1.  Atrial fibrillation -persistent and symptomatic - severe LAE - on metoprolol and Cardizem for rate control.  - on  apixaban for anticoagulation.  - has been on amiodarone  - s/p DCCV on 4/7//22 - maintaining NSR for now.  - follow up labs look good - she is having GI side effects on amiodarone. Plan to reduce dose to 100 mg daily. If symptoms do not improve with dose reduction may need to stop it.    2. Acute on chronic diastolic CHF-   Improved class 2 symptoms. Continue metoprolol, Cardizem Heart healthy low-sodium diet continue support stocking Daily weights    3. Hypomagnesemia-repleted.     Current medicines are reviewed at length with the patient today.  The patient does not have concerns regarding medicines.  The following changes have been made:  See above  Labs/ tests ordered today include:   No orders of the defined types were placed in this encounter.    Disposition:   FU 3-4 months  Signed, Jonice Cerra [place], MD  01/09/2021 12:08 PM    [facility] [address] [place], [place],  [address] Phone [phone], Fax [phone]

## 2021-01-09 ENCOUNTER — Other Ambulatory Visit: Payer: Self-pay

## 2021-01-09 ENCOUNTER — Ambulatory Visit: Payer: Medicare PPO | Admitting: Cardiology

## 2021-01-09 ENCOUNTER — Encounter: Payer: Self-pay | Admitting: Cardiology

## 2021-01-09 VITALS — BP 160/66 | HR 63 | Ht 65.0 in | Wt 160.8 lb

## 2021-01-09 DIAGNOSIS — I4819 Other persistent atrial fibrillation: Secondary | ICD-10-CM | POA: Diagnosis not present

## 2021-01-09 DIAGNOSIS — I5030 Unspecified diastolic (congestive) heart failure: Secondary | ICD-10-CM | POA: Diagnosis not present

## 2021-01-16 DIAGNOSIS — H0288A Meibomian gland dysfunction right eye, upper and lower eyelids: Secondary | ICD-10-CM | POA: Diagnosis not present

## 2021-01-16 DIAGNOSIS — H409 Unspecified glaucoma: Secondary | ICD-10-CM | POA: Diagnosis not present

## 2021-01-16 DIAGNOSIS — H18421 Band keratopathy, right eye: Secondary | ICD-10-CM | POA: Diagnosis not present

## 2021-01-16 DIAGNOSIS — H16221 Keratoconjunctivitis sicca, not specified as Sjogren's, right eye: Secondary | ICD-10-CM | POA: Diagnosis not present

## 2021-01-16 DIAGNOSIS — H179 Unspecified corneal scar and opacity: Secondary | ICD-10-CM | POA: Diagnosis not present

## 2021-01-16 DIAGNOSIS — H0288B Meibomian gland dysfunction left eye, upper and lower eyelids: Secondary | ICD-10-CM | POA: Diagnosis not present

## 2021-01-16 DIAGNOSIS — Z961 Presence of intraocular lens: Secondary | ICD-10-CM | POA: Diagnosis not present

## 2021-03-17 NOTE — Progress Notes (Signed)
Cardiology Office Note   Date:  03/31/2021   ID:  Regina Taylor, DOB April 08, 1928, MRN [id number]  PCP:  Merri Brunette, MD  Cardiologist:   Doriann Zuch [place], MD   Chief Complaint  Patient presents with   Atrial Fibrillation       History of Present Illness: Regina Taylor is a 87 y.o. female who presents for follow up Afib. She has a PMH of SVT, GERD, atrial fibrillation, hyponatremia, diastolic CHF, and glaucoma.   She was  admitted to the hospital 03/17/2020. She presented to the emergency department with shortness of breath and orthopnea.    She  was found to have A. fib with RVR.  Her echocardiogram showed an EF of 60-65%, moderate LVH, mild MR, AR, mild aortic valve sclerosis, mild pulmonary hypertension and severe LAE.  She was placed on Eliquis 5 mg twice daily, and rate control with Cardizem and  Metoprolol. She was diuresed but not continued on diuretics.   She was seen in January with some increased swelling. Given lasix for 3 days and instructed on low sodium diet.    She continued to have significant symptoms of dyspnea with any exertion. After discussion of treatment options she was started on amiodarone.  She subsequently underwent elective DCCV on 08/25/20. She did develop GI side effects on amiodarone so her dose was reduced to 100 mg daily. This did help her GI side effects and she is tolerating well. She still notes a lack of energy and some SOB. Is limited by arthritis in her hip.     Past Medical History:  Diagnosis Date   Arthritis    Blindness of left eye    Fatigue    GERD (gastroesophageal reflux disease)    Glaucoma    Macular degeneration    MVP (mitral valve prolapse)    Palpitations    SVT (supraventricular tachycardia) (HCC)     Past Surgical History:  Procedure Laterality Date   CARDIOVERSION N/A 08/25/2020   Procedure: CARDIOVERSION;  Surgeon: Little Ishikawa, MD;  Location: [facility] ENDOSCOPY;  Service: Cardiovascular;  Laterality: N/A;    CATARAC     CATARACT EXTRACTION     PARTIAL HIP ARTHROPLASTY     RETINAL DETACHMENT SURGERY     TOTAL HIP ARTHROPLASTY Right 1990     Current Outpatient Medications  Medication Sig Dispense Refill   amiodarone (PACERONE) 200 MG tablet 100 mg. Take 1/2 tablet ( 100 mg ) daily 90 tablet 3   apixaban (ELIQUIS) 5 MG TABS tablet Take 5 mg by mouth 2 (two) times daily. 1 tablet twice Daily     Artificial Tear Solution (BION TEARS OP) Apply 1 drop to eye daily as needed (dry eyes).     hypromellose (GENTEAL) 0.3 % GEL ophthalmic ointment Place 1 drop into the right eye at bedtime.     losartan (COZAAR) 25 MG tablet Take 1 tablet (25 mg total) by mouth daily. 90 tablet 3   Multiple Vitamins-Minerals (ICAPS PO) Take 1 capsule by mouth daily.     Red Yeast Rice Extract (RED YEAST RICE PO) Take 1 tablet by mouth daily.     TIMOPTIC OCUDOSE 0.5 % SOLN Place 1 drop into the right eye daily.      TOPROL XL 50 MG 24 hr tablet Take 1 tablet (50 mg total) by mouth daily. Take with or immediately following a meal. 90 tablet 3   diltiazem (CARDIZEM CD) 240 MG 24 hr capsule Take 1 capsule (  240 mg total) by mouth daily. 90 capsule 3   XIIDRA 5 % SOLN Place 1 drop into the right eye 2 (two) times daily.     No current facility-administered medications for this visit.    Allergies:   Shellfish-derived products, Iodinated diagnostic agents, Latanoprost, and Xalatan    Social History:  The patient  reports that she quit smoking about 41 years ago. Her smoking use included cigarettes. She has never used smokeless tobacco. She reports current alcohol use. She reports that she does not use drugs.   Family History:  The patient's family history includes Cancer in her mother; Heart attack in her brother; Heart disease in her father.    ROS:  Please see the history of present illness.   Otherwise, review of systems are positive for none.   All other systems are reviewed and negative.    PHYSICAL EXAM: VS:  BP  (!) 180/80 (BP Location: Right Arm, Cuff Size: Normal)   Pulse (!) 54   Ht 5\' 5"  (1.651 m)   Wt 162 lb 6.4 oz (73.7 kg)   SpO2 98%   BMI 27.02 kg/m  , BMI Body mass index is 27.02 kg/m. GEN: Well nourished, well developed, in no acute distress  HEENT: normal  Neck: no JVD, carotid bruits, or masses Cardiac: RRR; no murmurs, rubs, or gallops, 1+ edema - support stockings in place.  Respiratory:  clear to auscultation bilaterally, normal work of breathing GI: soft, nontender, nondistended, + BS MS: no deformity or atrophy  Skin: warm and dry, no rash Neuro:  Strength and sensation are intact Psych: euthymic mood, full affect   EKG:  EKG is not ordered today.     Recent Labs: 08/15/2020: Hemoglobin 13.5; Magnesium 2.2; Platelets 203 01/05/2021: ALT 24; BUN 13; Creatinine, Ser 0.94; Potassium 4.8; Sodium 136; TSH 0.770  Dated 09/16/19: cholesterol 194, triglycerides 108, HDL 49, LDL 125.   Lipid Panel    Component Value Date/Time   CHOL 147 01/25/2014 2340   TRIG 63 01/25/2014 2340   HDL 47 01/25/2014 2340   CHOLHDL 3.1 01/25/2014 2340   VLDL 13 01/25/2014 2340   LDLCALC 87 01/25/2014 2340    Dated 06/13/20: Normal CMET and CBC  Wt Readings from Last 3 Encounters:  03/31/21 162 lb 6.4 oz (73.7 kg)  01/09/21 160 lb 12.8 oz (72.9 kg)  09/22/20 164 lb (74.4 kg)      Other studies Reviewed: Additional studies/ records that were reviewed today include:   Echo 03/18/20: IMPRESSIONS     1. Left ventricular ejection fraction, by estimation, is 60 to 65%. The  left ventricle has normal function. The left ventricle has no regional  wall motion abnormalities. There is moderate concentric left ventricular  hypertrophy. Left ventricular  diastolic function could not be evaluated.   2. Right ventricular systolic function is normal. The right ventricular  size is normal. There is moderately elevated pulmonary artery systolic  pressure. The estimated right ventricular systolic  pressure is 45.2 mmHg.   3. Left atrial size was severely dilated.   4. Right atrial size was mildly dilated.   5. The mitral valve is degenerative. Trivial mitral valve regurgitation.  No evidence of mitral stenosis.   6. The aortic valve is tricuspid. There is mild calcification of the  aortic valve. There is mild thickening of the aortic valve. Aortic valve  regurgitation is mild. Mild aortic valve sclerosis is present, with no  evidence of aortic valve stenosis.   7.  The inferior vena cava is dilated in size with <50% respiratory  variability, suggesting right atrial pressure of 15 mmHg.   Comparison(s): Changes from prior study are noted. In Afib with RVR. EF  remains the same. RVSP ~45 mmHG on this study  ASSESSMENT AND PLAN:   1.  Atrial fibrillation -persistent and symptomatic - severe LAE - on metoprolol and Cardizem for rate control.  - on  apixaban for anticoagulation.  - has been on amiodarone dose reduced due to GI side effects.  - s/p DCCV on 4/7//22 - maintaining NSR for now pulse is regular today - follow up in 6 months with lab.    2. Chronic diastolic CHF-   Improved class 2 symptoms. Continue metoprolol, Cardizem Heart healthy low-sodium diet continue support stocking Daily weights    3. Hypertension- BP 180 systolic today. Will add losartan 25 mg daily. Restrict salt intake.       Disposition:   FU 6 months  Signed, Morgan Rennert [place], MD  03/31/2021 10:42 AM    [facility] [address] [place], [place], [address] Phone [phone], Fax [phone]

## 2021-03-31 ENCOUNTER — Other Ambulatory Visit: Payer: Self-pay

## 2021-03-31 ENCOUNTER — Encounter: Payer: Self-pay | Admitting: Cardiology

## 2021-03-31 ENCOUNTER — Ambulatory Visit: Payer: Medicare PPO | Admitting: Cardiology

## 2021-03-31 VITALS — BP 180/80 | HR 54 | Ht 65.0 in | Wt 162.4 lb

## 2021-03-31 DIAGNOSIS — I4819 Other persistent atrial fibrillation: Secondary | ICD-10-CM | POA: Diagnosis not present

## 2021-03-31 DIAGNOSIS — I5030 Unspecified diastolic (congestive) heart failure: Secondary | ICD-10-CM

## 2021-03-31 DIAGNOSIS — I10 Essential (primary) hypertension: Secondary | ICD-10-CM | POA: Diagnosis not present

## 2021-03-31 MED ORDER — LOSARTAN POTASSIUM 25 MG PO TABS: 25.0000 mg | ORAL_TABLET | Freq: Every day | ORAL | 3 refills | Status: DC

## 2021-03-31 NOTE — Patient Instructions (Signed)
Will add losartan 25 mg daily for high blood pressure  Restrict your sodium intake  Continue your other medication  Check your blood pressure at home periodically- would like to see your blood pressure less than 150   Follow up in 6 months

## 2021-04-03 DIAGNOSIS — H16221 Keratoconjunctivitis sicca, not specified as Sjogren's, right eye: Secondary | ICD-10-CM | POA: Diagnosis not present

## 2021-04-03 DIAGNOSIS — H0288A Meibomian gland dysfunction right eye, upper and lower eyelids: Secondary | ICD-10-CM | POA: Diagnosis not present

## 2021-04-03 DIAGNOSIS — H409 Unspecified glaucoma: Secondary | ICD-10-CM | POA: Diagnosis not present

## 2021-04-03 DIAGNOSIS — H0288B Meibomian gland dysfunction left eye, upper and lower eyelids: Secondary | ICD-10-CM | POA: Diagnosis not present

## 2021-04-03 DIAGNOSIS — H18421 Band keratopathy, right eye: Secondary | ICD-10-CM | POA: Diagnosis not present

## 2021-04-03 DIAGNOSIS — H353111 Nonexudative age-related macular degeneration, right eye, early dry stage: Secondary | ICD-10-CM | POA: Diagnosis not present

## 2021-04-03 DIAGNOSIS — H179 Unspecified corneal scar and opacity: Secondary | ICD-10-CM | POA: Diagnosis not present

## 2021-04-03 DIAGNOSIS — Z961 Presence of intraocular lens: Secondary | ICD-10-CM | POA: Diagnosis not present

## 2021-04-11 ENCOUNTER — Other Ambulatory Visit: Payer: Self-pay | Admitting: General Practice

## 2021-04-11 NOTE — Telephone Encounter (Signed)
Prescription refill request for Eliquis received. Indication: Afib  Last office visit:03/31/21 ([place])  Scr: 0.94 (01/05/21)  Age: 87 Weight: 73.3kg  Appropriate dose and refill sent to requested pharmacy.

## 2021-06-12 DIAGNOSIS — I471 Supraventricular tachycardia: Secondary | ICD-10-CM | POA: Diagnosis not present

## 2021-06-12 DIAGNOSIS — Z23 Encounter for immunization: Secondary | ICD-10-CM | POA: Diagnosis not present

## 2021-06-12 DIAGNOSIS — Z1389 Encounter for screening for other disorder: Secondary | ICD-10-CM | POA: Diagnosis not present

## 2021-06-12 DIAGNOSIS — I4819 Other persistent atrial fibrillation: Secondary | ICD-10-CM | POA: Diagnosis not present

## 2021-06-12 DIAGNOSIS — Z0000 Encounter for general adult medical examination without abnormal findings: Secondary | ICD-10-CM | POA: Diagnosis not present

## 2021-06-12 DIAGNOSIS — K625 Hemorrhage of anus and rectum: Secondary | ICD-10-CM | POA: Diagnosis not present

## 2021-06-12 DIAGNOSIS — I10 Essential (primary) hypertension: Secondary | ICD-10-CM | POA: Diagnosis not present

## 2021-06-12 DIAGNOSIS — E7800 Pure hypercholesterolemia, unspecified: Secondary | ICD-10-CM | POA: Diagnosis not present

## 2021-07-06 DIAGNOSIS — S29012A Strain of muscle and tendon of back wall of thorax, initial encounter: Secondary | ICD-10-CM | POA: Diagnosis not present

## 2021-07-17 DIAGNOSIS — M5451 Vertebrogenic low back pain: Secondary | ICD-10-CM | POA: Diagnosis not present

## 2021-07-17 DIAGNOSIS — M545 Low back pain, unspecified: Secondary | ICD-10-CM | POA: Insufficient documentation

## 2021-07-31 DIAGNOSIS — H5319 Other subjective visual disturbances: Secondary | ICD-10-CM | POA: Diagnosis not present

## 2021-07-31 DIAGNOSIS — H4421 Degenerative myopia, right eye: Secondary | ICD-10-CM | POA: Diagnosis not present

## 2021-07-31 DIAGNOSIS — H04123 Dry eye syndrome of bilateral lacrimal glands: Secondary | ICD-10-CM | POA: Diagnosis not present

## 2021-07-31 DIAGNOSIS — H1789 Other corneal scars and opacities: Secondary | ICD-10-CM | POA: Diagnosis not present

## 2021-07-31 DIAGNOSIS — H31091 Other chorioretinal scars, right eye: Secondary | ICD-10-CM | POA: Diagnosis not present

## 2021-07-31 DIAGNOSIS — H401110 Primary open-angle glaucoma, right eye, stage unspecified: Secondary | ICD-10-CM | POA: Diagnosis not present

## 2021-07-31 DIAGNOSIS — H53411 Scotoma involving central area, right eye: Secondary | ICD-10-CM | POA: Diagnosis not present

## 2021-07-31 DIAGNOSIS — Z970 Presence of artificial eye: Secondary | ICD-10-CM | POA: Diagnosis not present

## 2021-08-03 DIAGNOSIS — M546 Pain in thoracic spine: Secondary | ICD-10-CM | POA: Diagnosis not present

## 2021-08-03 DIAGNOSIS — M25552 Pain in left hip: Secondary | ICD-10-CM | POA: Diagnosis not present

## 2021-08-09 ENCOUNTER — Other Ambulatory Visit: Payer: Self-pay

## 2021-08-09 ENCOUNTER — Encounter: Payer: Self-pay | Admitting: Cardiology

## 2021-08-09 MED ORDER — AMIODARONE HCL 200 MG PO TABS: ORAL_TABLET | ORAL | 3 refills | Status: DC

## 2021-08-17 DIAGNOSIS — R051 Acute cough: Secondary | ICD-10-CM | POA: Diagnosis not present

## 2021-08-17 DIAGNOSIS — J208 Acute bronchitis due to other specified organisms: Secondary | ICD-10-CM | POA: Diagnosis not present

## 2021-10-02 DIAGNOSIS — H16221 Keratoconjunctivitis sicca, not specified as Sjogren's, right eye: Secondary | ICD-10-CM | POA: Diagnosis not present

## 2021-10-02 DIAGNOSIS — H442B1 Degenerative myopia with macular hole, right eye: Secondary | ICD-10-CM | POA: Diagnosis not present

## 2021-10-02 DIAGNOSIS — Z9889 Other specified postprocedural states: Secondary | ICD-10-CM | POA: Diagnosis not present

## 2021-10-02 DIAGNOSIS — H31091 Other chorioretinal scars, right eye: Secondary | ICD-10-CM | POA: Diagnosis not present

## 2021-10-02 DIAGNOSIS — H409 Unspecified glaucoma: Secondary | ICD-10-CM | POA: Diagnosis not present

## 2021-10-02 DIAGNOSIS — H0288A Meibomian gland dysfunction right eye, upper and lower eyelids: Secondary | ICD-10-CM | POA: Diagnosis not present

## 2021-10-02 DIAGNOSIS — H18421 Band keratopathy, right eye: Secondary | ICD-10-CM | POA: Diagnosis not present

## 2021-10-02 DIAGNOSIS — H4421 Degenerative myopia, right eye: Secondary | ICD-10-CM | POA: Diagnosis not present

## 2021-10-02 DIAGNOSIS — H0288B Meibomian gland dysfunction left eye, upper and lower eyelids: Secondary | ICD-10-CM | POA: Diagnosis not present

## 2021-10-07 ENCOUNTER — Other Ambulatory Visit: Payer: Self-pay | Admitting: Cardiology

## 2021-10-09 NOTE — Telephone Encounter (Signed)
Prescription refill request for Eliquis received. Indication:Afib Last office visit:11/22 Scr:0.9 Age: 89 Weight:73.7 kg  Prescription refilled

## 2021-10-18 NOTE — Progress Notes (Signed)
f    Cardiology Office Note   Date:  10/23/2021   ID:  Regina Taylor, DOB 08/23/1927, MRN [id number]  PCP:  Merri BrunetteSmith, Candace, MD  Cardiologist:   Olof Marcil [place], MD   Chief Complaint  Patient presents with   Atrial Fibrillation       History of Present Illness: Regina SallesJane T Taylor is a 88 y.o. female who presents for follow up Afib. She has a PMH of SVT, GERD, atrial fibrillation, hyponatremia, diastolic CHF, and glaucoma.   She was  admitted to the hospital 03/17/2020. She presented to the emergency department with shortness of breath and orthopnea.    She  was found to have A. fib with RVR.  Her echocardiogram showed an EF of 60-65%, moderate LVH, mild MR, AR, mild aortic valve sclerosis, mild pulmonary hypertension and severe LAE.  She was placed on Eliquis 5 mg twice daily, and rate control with Cardizem and  Metoprolol. She was diuresed but not continued on diuretics.   She was seen in January with some increased swelling. Given lasix for 3 days and instructed on low sodium diet.    She continued to have significant symptoms of dyspnea with any exertion. After discussion of treatment options she was started on amiodarone.  She subsequently underwent elective DCCV on 08/25/20. She did develop GI side effects on amiodarone so her dose was reduced to 100 mg daily. This did help her GI side effects and she is tolerating well. She still notes a lack of energy and some SOB. Is limited by arthritis in her hip.   On follow up today she is seen with her son. She notes she has a little nausea in the am. She has SOB. Sometimes gets SOB walking across the room. Has bad arthritis in her left leg that limits her mobility. No edema. Weight is down 2 lbs.     Past Medical History:  Diagnosis Date   Arthritis    Blindness of left eye    Fatigue    GERD (gastroesophageal reflux disease)    Glaucoma    Macular degeneration    MVP (mitral valve prolapse)    Palpitations    SVT (supraventricular  tachycardia) (HCC)     Past Surgical History:  Procedure Laterality Date   CARDIOVERSION N/A 08/25/2020   Procedure: CARDIOVERSION;  Surgeon: Little IshikawaSchumann, Christopher L, MD;  [facility];  Service: Cardiovascular;  Laterality: N/A;   CATARAC     CATARACT EXTRACTION     PARTIAL HIP ARTHROPLASTY     RETINAL DETACHMENT SURGERY     TOTAL HIP ARTHROPLASTY Right 1990     Current Outpatient Medications  Medication Sig Dispense Refill   amiodarone (PACERONE) 200 MG tablet Take 1/2 tablet ( 100 mg ) daily 90 tablet 3   Artificial Tear Solution (BION TEARS OP) Apply 1 drop to eye daily as needed (dry eyes).     BION TEARS PF 0.1-0.3 % SOLN SMARTSIG:1 Drop(s) Right Eye PRN     COSOPT PF 2-0.5 % SOLN ophthalmic solution Place 1 drop into the right eye 2 (two) times daily.     diltiazem (CARDIZEM CD) 240 MG 24 hr capsule Take 1 capsule (240 mg total) by mouth daily. 90 capsule 3   ELIQUIS 5 MG TABS tablet TAKE 1 TABLET BY MOUTH TWICE DAILY. 180 tablet 1   hypromellose (GENTEAL) 0.3 % GEL ophthalmic ointment Place 1 drop into the right eye at bedtime.     losartan (COZAAR) 25 MG tablet  Take 1 tablet (25 mg total) by mouth daily. 90 tablet 3   Multiple Vitamins-Minerals (ICAPS PO) Take 1 capsule by mouth daily.     Red Yeast Rice Extract (RED YEAST RICE PO) Take 1 tablet by mouth daily.     REFRESH CELLUVISC 1 % GEL Place 1 drop into the right eye at bedtime.     TOPROL XL 50 MG 24 hr tablet Take 1 tablet (50 mg total) by mouth daily. Take with or immediately following a meal. 90 tablet 3   XIIDRA 5 % SOLN Place 1 drop into the right eye 2 (two) times daily.     No current facility-administered medications for this visit.    Allergies:   Shellfish-derived products, Iodinated contrast media, Latanoprost, and Xalatan    Social History:  The patient  reports that she quit smoking about 42 years ago. Her smoking use included cigarettes. She has never used smokeless tobacco. She reports  current alcohol use. She reports that she does not use drugs.   Family History:  The patient's family history includes Cancer in her mother; Heart attack in her brother; Heart disease in her father.    ROS:  Please see the history of present illness.   Otherwise, review of systems are positive for none.   All other systems are reviewed and negative.    PHYSICAL EXAM: VS:  BP (!) 152/66   Pulse (!) 51   Ht 5' 4.05" (1.627 m)   Wt 160 lb 6.4 oz (72.8 kg)   SpO2 98%   BMI 27.49 kg/m  , BMI Body mass index is 27.49 kg/m. GEN: Well nourished, well developed, in no acute distress  HEENT: normal  Neck: no JVD, carotid bruits, or masses Cardiac: RRR; no murmurs, rubs, or gallops, 1+ edema - support stockings in place.  Respiratory:  clear to auscultation bilaterally, normal work of breathing GI: soft, nontender, nondistended, + BS MS: no deformity or atrophy  Skin: warm and dry, no rash Neuro:  Strength and sensation are intact Psych: euthymic mood, full affect   EKG:  EKG is ordered today. NSR with occ. PVC. Rate 51. Nonspecific ST abnormality. I have personally reviewed and interpreted this study.      Recent Labs: 01/05/2021: ALT 24; BUN 13; Creatinine, Ser 0.94; Potassium 4.8; Sodium 136; TSH 0.770  Dated 09/16/19: cholesterol 194, triglycerides 108, HDL 49, LDL 125.  Dated 06/12/21: cholesterol 226, triglycerides 106, HDL 62, LDL 145. CMET normal.  Lipid Panel    Component Value Date/Time   CHOL 147 01/25/2014 2340   TRIG 63 01/25/2014 2340   HDL 47 01/25/2014 2340   CHOLHDL 3.1 01/25/2014 2340   VLDL 13 01/25/2014 2340   LDLCALC 87 01/25/2014 2340    Dated 06/13/20: Normal CMET and CBC  Wt Readings from Last 3 Encounters:  10/23/21 160 lb 6.4 oz (72.8 kg)  03/31/21 162 lb 6.4 oz (73.7 kg)  01/09/21 160 lb 12.8 oz (72.9 kg)      Other studies Reviewed: Additional studies/ records that were reviewed today include:   Echo 03/18/20: IMPRESSIONS     1. Left  ventricular ejection fraction, by estimation, is 60 to 65%. The  left ventricle has normal function. The left ventricle has no regional  wall motion abnormalities. There is moderate concentric left ventricular  hypertrophy. Left ventricular  diastolic function could not be evaluated.   2. Right ventricular systolic function is normal. The right ventricular  size is normal. There is moderately elevated  pulmonary artery systolic  pressure. The estimated right ventricular systolic pressure is 45.2 mmHg.   3. Left atrial size was severely dilated.   4. Right atrial size was mildly dilated.   5. The mitral valve is degenerative. Trivial mitral valve regurgitation.  No evidence of mitral stenosis.   6. The aortic valve is tricuspid. There is mild calcification of the  aortic valve. There is mild thickening of the aortic valve. Aortic valve  regurgitation is mild. Mild aortic valve sclerosis is present, with no  evidence of aortic valve stenosis.   7. The inferior vena cava is dilated in size with <50% respiratory  variability, suggesting right atrial pressure of 15 mmHg.   Comparison(s): Changes from prior study are noted. In Afib with RVR. EF  remains the same. RVSP ~45 mmHG on this study  ASSESSMENT AND PLAN:   1.  Atrial fibrillation -persistent  - severe LAE - on metoprolol and Cardizem for rate control.  - on  apixaban for anticoagulation.  - has been on amiodarone and is maintaining NSR - s/p DCCV on 4/7//22 - needs follow up lab work today on amiodarone and Eliquis.  - will reduce amiodarone to 100 mg 6 days a week. Omit one day.   2. Chronic diastolic CHF-   Chronic class 2-3 symptoms. Continue metoprolol, Cardizem Does not appear volume overloaded continue support stocking Daily weights Sodium restriction Discussed adding an SGLT 2 inhibitor but she would like to avoid taking additional medication.     3. Hypertension- BP improved since we started on  losartan.     Disposition:   FU 6 months  Signed, Malaika Arnall [place], MD  10/23/2021 10:18 AM    [facility] [address], [place], [place], [address] Phone [phone], Fax [phone]

## 2021-10-23 ENCOUNTER — Ambulatory Visit: Payer: Medicare PPO | Admitting: Cardiology

## 2021-10-23 ENCOUNTER — Encounter: Payer: Self-pay | Admitting: Cardiology

## 2021-10-23 VITALS — BP 152/66 | HR 51 | Ht 64.05 in | Wt 160.4 lb

## 2021-10-23 DIAGNOSIS — I480 Paroxysmal atrial fibrillation: Secondary | ICD-10-CM | POA: Diagnosis not present

## 2021-10-23 DIAGNOSIS — I5030 Unspecified diastolic (congestive) heart failure: Secondary | ICD-10-CM

## 2021-10-23 LAB — CBC
Hematocrit: 29 % — ABNORMAL LOW (ref 34.0–46.6)
Hemoglobin: 8.8 g/dL — ABNORMAL LOW (ref 11.1–15.9)
MCH: 23.5 pg — ABNORMAL LOW (ref 26.6–33.0)
MCHC: 30.3 g/dL — ABNORMAL LOW (ref 31.5–35.7)
MCV: 77 fL — ABNORMAL LOW (ref 79–97)
Platelets: 307 10*3/uL (ref 150–450)
RBC: 3.75 x10E6/uL — ABNORMAL LOW (ref 3.77–5.28)
RDW: 14.3 % (ref 11.7–15.4)
WBC: 6.1 10*3/uL (ref 3.4–10.8)

## 2021-10-23 LAB — COMPREHENSIVE METABOLIC PANEL
ALT: 17 IU/L (ref 0–32)
AST: 18 IU/L (ref 0–40)
Albumin/Globulin Ratio: 1.4 (ref 1.2–2.2)
Albumin: 4.2 g/dL (ref 3.5–4.6)
Alkaline Phosphatase: 82 IU/L (ref 44–121)
BUN/Creatinine Ratio: 24 (ref 12–28)
BUN: 23 mg/dL (ref 10–36)
Bilirubin Total: 0.2 mg/dL (ref 0.0–1.2)
CO2: 23 mmol/L (ref 20–29)
Calcium: 9.5 mg/dL (ref 8.7–10.3)
Chloride: 100 mmol/L (ref 96–106)
Creatinine, Ser: 0.95 mg/dL (ref 0.57–1.00)
Globulin, Total: 2.9 g/dL (ref 1.5–4.5)
Glucose: 109 mg/dL — ABNORMAL HIGH (ref 70–99)
Potassium: 5.1 mmol/L (ref 3.5–5.2)
Sodium: 135 mmol/L (ref 134–144)
Total Protein: 7.1 g/dL (ref 6.0–8.5)
eGFR: 56 mL/min/{1.73_m2} — ABNORMAL LOW (ref >59)

## 2021-10-23 LAB — TSH: TSH: 0.792 u[IU]/mL (ref 0.450–4.500)

## 2021-10-23 MED ORDER — AMIODARONE HCL 200 MG PO TABS: ORAL_TABLET | ORAL | 3 refills | Status: DC

## 2021-10-23 NOTE — Patient Instructions (Signed)
Medication Instructions:   -Take amiodarone (pacerone) 100mg  once daily Monday thru Saturday, do not take on Sunday.  *If you need a refill on your cardiac medications before your next appointment, please call your pharmacy*   Lab Work: Your physician recommends that you have labs drawn today: CMET, CBC, TSH  If you have labs (blood work) drawn today and your tests are completely normal, you will receive your results only by: [place] (if you have MyChart) OR A paper copy in the mail If you have any lab test that is abnormal or we need to change your treatment, we will call you to review the results.   Follow-Up: At [facility], you and your health needs are our priority.  As part of our continuing mission to provide you with exceptional heart care, we have created designated Provider Care Teams.  These Care Teams include your primary Cardiologist (physician) and Advanced Practice Providers (APPs -  Physician Assistants and Nurse Practitioners) who all work together to provide you with the care you need, when you need it.  We recommend signing up for the patient portal called "MyChart".  Sign up information is provided on this After Visit Summary.  MyChart is used to connect with patients for Virtual Visits (Telemedicine).  Patients are able to view lab/test results, encounter notes, upcoming appointments, etc.  Non-urgent messages can be sent to your provider as well.   To learn more about what you can do with MyChart, go to [email or web address].    Your next appointment:   6 month(s)  The format for your next appointment:   In Person  Provider:   Peter Martinique, MD

## 2021-10-24 ENCOUNTER — Other Ambulatory Visit: Payer: Self-pay

## 2021-10-24 DIAGNOSIS — Z79899 Other long term (current) drug therapy: Secondary | ICD-10-CM

## 2021-10-24 DIAGNOSIS — I4819 Other persistent atrial fibrillation: Secondary | ICD-10-CM

## 2021-10-24 DIAGNOSIS — I480 Paroxysmal atrial fibrillation: Secondary | ICD-10-CM

## 2021-11-06 DIAGNOSIS — D6489 Other specified anemias: Secondary | ICD-10-CM | POA: Diagnosis not present

## 2021-11-14 DIAGNOSIS — D6489 Other specified anemias: Secondary | ICD-10-CM | POA: Diagnosis not present

## 2021-11-15 ENCOUNTER — Ambulatory Visit: Payer: Medicare PPO

## 2021-12-01 ENCOUNTER — Other Ambulatory Visit: Payer: Self-pay | Admitting: Gastroenterology

## 2021-12-01 DIAGNOSIS — E611 Iron deficiency: Secondary | ICD-10-CM

## 2021-12-01 DIAGNOSIS — R195 Other fecal abnormalities: Secondary | ICD-10-CM | POA: Diagnosis not present

## 2021-12-01 DIAGNOSIS — D509 Iron deficiency anemia, unspecified: Secondary | ICD-10-CM | POA: Diagnosis not present

## 2021-12-01 DIAGNOSIS — R634 Abnormal weight loss: Secondary | ICD-10-CM

## 2021-12-04 ENCOUNTER — Other Ambulatory Visit: Payer: Self-pay | Admitting: Gastroenterology

## 2021-12-04 ENCOUNTER — Ambulatory Visit
Admission: RE | Admit: 2021-12-04 | Discharge: 2021-12-04 | Disposition: A | Discharge status: Home / Self Care | Payer: Medicare PPO | Source: Ambulatory Visit | Attending: Gastroenterology | Admitting: Gastroenterology

## 2021-12-04 DIAGNOSIS — R634 Abnormal weight loss: Secondary | ICD-10-CM

## 2021-12-04 DIAGNOSIS — E611 Iron deficiency: Secondary | ICD-10-CM

## 2021-12-05 ENCOUNTER — Telehealth: Payer: Self-pay

## 2021-12-05 DIAGNOSIS — I10 Essential (primary) hypertension: Secondary | ICD-10-CM

## 2021-12-05 DIAGNOSIS — I5032 Chronic diastolic (congestive) heart failure: Secondary | ICD-10-CM

## 2021-12-05 DIAGNOSIS — I700 Atherosclerosis of aorta: Secondary | ICD-10-CM

## 2021-12-05 NOTE — Telephone Encounter (Signed)
   Pre-operative Risk Assessment    Patient Name: Regina Taylor  DOB: Jun 01, 1927 MRN: [id number]      Request for Surgical Clearance    Procedure:   Colonoscopy/Endoscopy  Date of Surgery:  Clearance TBD                                 Surgeon:  Dr. Georgiann Cocker Surgeon's Group or Practice Name: [facility] Phone number:  [phone] Fax number:  [phone]   Type of Clearance Requested:   - Pharmacy:  Hold Apixaban (Eliquis) 1-2   Type of Anesthesia:  Not Indicated   Additional requests/questions:    Regina Taylor   12/05/2021, 3:38 PM

## 2021-12-07 DIAGNOSIS — I700 Atherosclerosis of aorta: Secondary | ICD-10-CM | POA: Insufficient documentation

## 2021-12-07 DIAGNOSIS — I10 Essential (primary) hypertension: Secondary | ICD-10-CM | POA: Insufficient documentation

## 2021-12-07 DIAGNOSIS — I5032 Chronic diastolic (congestive) heart failure: Secondary | ICD-10-CM | POA: Insufficient documentation

## 2021-12-07 NOTE — Telephone Encounter (Signed)
   Primary Cardiologist: Peter [place], MD  Chart reviewed as part of pre-operative protocol coverage. Given past medical history and time since last visit, based on ACC/AHA guidelines, Regina Taylor would be at acceptable risk for the planned procedure without further cardiovascular testing.   Per office protocol, patient can hold Eliquis for 1-2 days prior to procedure.  I will route this recommendation to the requesting party via Epic fax function and remove from pre-op pool.  Please call with questions.  Levi Aland, NP-C    12/07/2021, 1:36 PM [facility] [address] Office [phone] Fax [phone]

## 2021-12-07 NOTE — Telephone Encounter (Signed)
Patient with diagnosis of afib on Eliquis for anticoagulation.    Procedure: colonoscopy/endoscopy Date of procedure: TBD  CHA2DS2-VASc Score = 6  This indicates a 9.7% annual risk of stroke. The patient's score is based upon: CHF History: 1 HTN History: 1 Diabetes History: 0 Stroke History: 0 Vascular Disease History: 1 Age Score: 2 Gender Score: 1   Aortic atherosclerosis noted on abdominal CT 11/2021, PMH updated.  CrCl 90mL/min Platelet count 307K  Per office protocol, patient can hold Eliquis for 1-2 days prior to procedure.    **This guidance is not considered finalized until pre-operative APP has relayed final recommendations.**

## 2022-01-02 DIAGNOSIS — R195 Other fecal abnormalities: Secondary | ICD-10-CM | POA: Diagnosis not present

## 2022-01-02 DIAGNOSIS — R634 Abnormal weight loss: Secondary | ICD-10-CM | POA: Diagnosis not present

## 2022-01-02 DIAGNOSIS — D509 Iron deficiency anemia, unspecified: Secondary | ICD-10-CM | POA: Diagnosis not present

## 2022-01-05 ENCOUNTER — Telehealth: Payer: Self-pay | Admitting: Cardiology

## 2022-01-05 NOTE — Telephone Encounter (Signed)
Patient calling to see if she still needs to come in to see the pharmacist because her medications are being handled by her PCP and gastroenterologist.

## 2022-01-05 NOTE — Telephone Encounter (Signed)
Patient stated her hgb is being treated by PCP.  Yesterday, her losartan was increased to BID. She asked if she still needed to come to pharmacy appointment on 8/23. She wasn't sure if it was for BP or xarelto. Please advise.

## 2022-01-05 NOTE — Telephone Encounter (Signed)
I can't see why she was put on our schedule.  If PCP is monitoring BP, okay to cancel PharmD appointment

## 2022-01-05 NOTE — Telephone Encounter (Signed)
Left message on patient's personal phone that I cancelled her 8/23 appointment with PharmD.

## 2022-01-08 ENCOUNTER — Encounter: Payer: Self-pay | Admitting: *Deleted

## 2022-01-10 ENCOUNTER — Ambulatory Visit: Payer: Medicare PPO

## 2022-01-11 ENCOUNTER — Other Ambulatory Visit: Payer: Self-pay | Admitting: Cardiology

## 2022-01-15 DIAGNOSIS — H18421 Band keratopathy, right eye: Secondary | ICD-10-CM | POA: Diagnosis not present

## 2022-01-15 DIAGNOSIS — H0288B Meibomian gland dysfunction left eye, upper and lower eyelids: Secondary | ICD-10-CM | POA: Diagnosis not present

## 2022-01-15 DIAGNOSIS — H16141 Punctate keratitis, right eye: Secondary | ICD-10-CM | POA: Diagnosis not present

## 2022-01-15 DIAGNOSIS — H0288A Meibomian gland dysfunction right eye, upper and lower eyelids: Secondary | ICD-10-CM | POA: Diagnosis not present

## 2022-01-15 DIAGNOSIS — H442B1 Degenerative myopia with macular hole, right eye: Secondary | ICD-10-CM | POA: Diagnosis not present

## 2022-01-15 DIAGNOSIS — H4421 Degenerative myopia, right eye: Secondary | ICD-10-CM | POA: Diagnosis not present

## 2022-01-15 DIAGNOSIS — H31091 Other chorioretinal scars, right eye: Secondary | ICD-10-CM | POA: Diagnosis not present

## 2022-01-15 DIAGNOSIS — H16221 Keratoconjunctivitis sicca, not specified as Sjogren's, right eye: Secondary | ICD-10-CM | POA: Diagnosis not present

## 2022-01-15 DIAGNOSIS — Z9889 Other specified postprocedural states: Secondary | ICD-10-CM | POA: Diagnosis not present

## 2022-01-18 DIAGNOSIS — H5440 Blindness, one eye, unspecified eye: Secondary | ICD-10-CM | POA: Diagnosis not present

## 2022-01-18 DIAGNOSIS — R1314 Dysphagia, pharyngoesophageal phase: Secondary | ICD-10-CM | POA: Diagnosis not present

## 2022-01-18 DIAGNOSIS — M1612 Unilateral primary osteoarthritis, left hip: Secondary | ICD-10-CM | POA: Diagnosis not present

## 2022-01-18 DIAGNOSIS — I341 Nonrheumatic mitral (valve) prolapse: Secondary | ICD-10-CM | POA: Diagnosis not present

## 2022-01-18 DIAGNOSIS — I10 Essential (primary) hypertension: Secondary | ICD-10-CM | POA: Diagnosis not present

## 2022-01-18 DIAGNOSIS — E7800 Pure hypercholesterolemia, unspecified: Secondary | ICD-10-CM | POA: Diagnosis not present

## 2022-01-18 DIAGNOSIS — D509 Iron deficiency anemia, unspecified: Secondary | ICD-10-CM | POA: Diagnosis not present

## 2022-01-18 DIAGNOSIS — H409 Unspecified glaucoma: Secondary | ICD-10-CM | POA: Diagnosis not present

## 2022-01-18 DIAGNOSIS — I4819 Other persistent atrial fibrillation: Secondary | ICD-10-CM | POA: Diagnosis not present

## 2022-02-05 DIAGNOSIS — R195 Other fecal abnormalities: Secondary | ICD-10-CM | POA: Diagnosis not present

## 2022-02-06 DIAGNOSIS — Z970 Presence of artificial eye: Secondary | ICD-10-CM | POA: Diagnosis not present

## 2022-02-06 DIAGNOSIS — H11431 Conjunctival hyperemia, right eye: Secondary | ICD-10-CM | POA: Diagnosis not present

## 2022-02-06 DIAGNOSIS — H02841 Edema of right upper eyelid: Secondary | ICD-10-CM | POA: Diagnosis not present

## 2022-02-06 DIAGNOSIS — H18001 Unspecified corneal deposit, right eye: Secondary | ICD-10-CM | POA: Diagnosis not present

## 2022-02-06 DIAGNOSIS — H18421 Band keratopathy, right eye: Secondary | ICD-10-CM | POA: Diagnosis not present

## 2022-02-06 DIAGNOSIS — H1789 Other corneal scars and opacities: Secondary | ICD-10-CM | POA: Diagnosis not present

## 2022-02-06 DIAGNOSIS — H18231 Secondary corneal edema, right eye: Secondary | ICD-10-CM | POA: Diagnosis not present

## 2022-02-06 DIAGNOSIS — H02842 Edema of right lower eyelid: Secondary | ICD-10-CM | POA: Diagnosis not present

## 2022-02-12 DIAGNOSIS — Z970 Presence of artificial eye: Secondary | ICD-10-CM | POA: Diagnosis not present

## 2022-02-12 DIAGNOSIS — H18421 Band keratopathy, right eye: Secondary | ICD-10-CM | POA: Diagnosis not present

## 2022-02-12 DIAGNOSIS — H18891 Other specified disorders of cornea, right eye: Secondary | ICD-10-CM | POA: Diagnosis not present

## 2022-02-12 DIAGNOSIS — H04121 Dry eye syndrome of right lacrimal gland: Secondary | ICD-10-CM | POA: Insufficient documentation

## 2022-02-12 DIAGNOSIS — H16231 Neurotrophic keratoconjunctivitis, right eye: Secondary | ICD-10-CM | POA: Diagnosis not present

## 2022-02-14 ENCOUNTER — Encounter: Payer: Self-pay | Admitting: Cardiology

## 2022-02-22 DIAGNOSIS — I10 Essential (primary) hypertension: Secondary | ICD-10-CM | POA: Diagnosis not present

## 2022-02-22 DIAGNOSIS — I4819 Other persistent atrial fibrillation: Secondary | ICD-10-CM | POA: Diagnosis not present

## 2022-03-09 ENCOUNTER — Other Ambulatory Visit: Payer: Self-pay | Admitting: Cardiology

## 2022-03-12 DIAGNOSIS — H18891 Other specified disorders of cornea, right eye: Secondary | ICD-10-CM | POA: Diagnosis not present

## 2022-03-12 DIAGNOSIS — H04121 Dry eye syndrome of right lacrimal gland: Secondary | ICD-10-CM | POA: Diagnosis not present

## 2022-03-12 DIAGNOSIS — H16231 Neurotrophic keratoconjunctivitis, right eye: Secondary | ICD-10-CM | POA: Diagnosis not present

## 2022-03-12 DIAGNOSIS — Z970 Presence of artificial eye: Secondary | ICD-10-CM | POA: Diagnosis not present

## 2022-03-12 DIAGNOSIS — H18421 Band keratopathy, right eye: Secondary | ICD-10-CM | POA: Diagnosis not present

## 2022-03-25 IMAGING — DX DG CHEST 1V PORT
1 series · 1 of 1 positions shown · non-contrast
Comparison: No prior.

CLINICAL DATA: Shortness of breath.  Cough.

EXAM:
PORTABLE CHEST 1 VIEW

[chest]
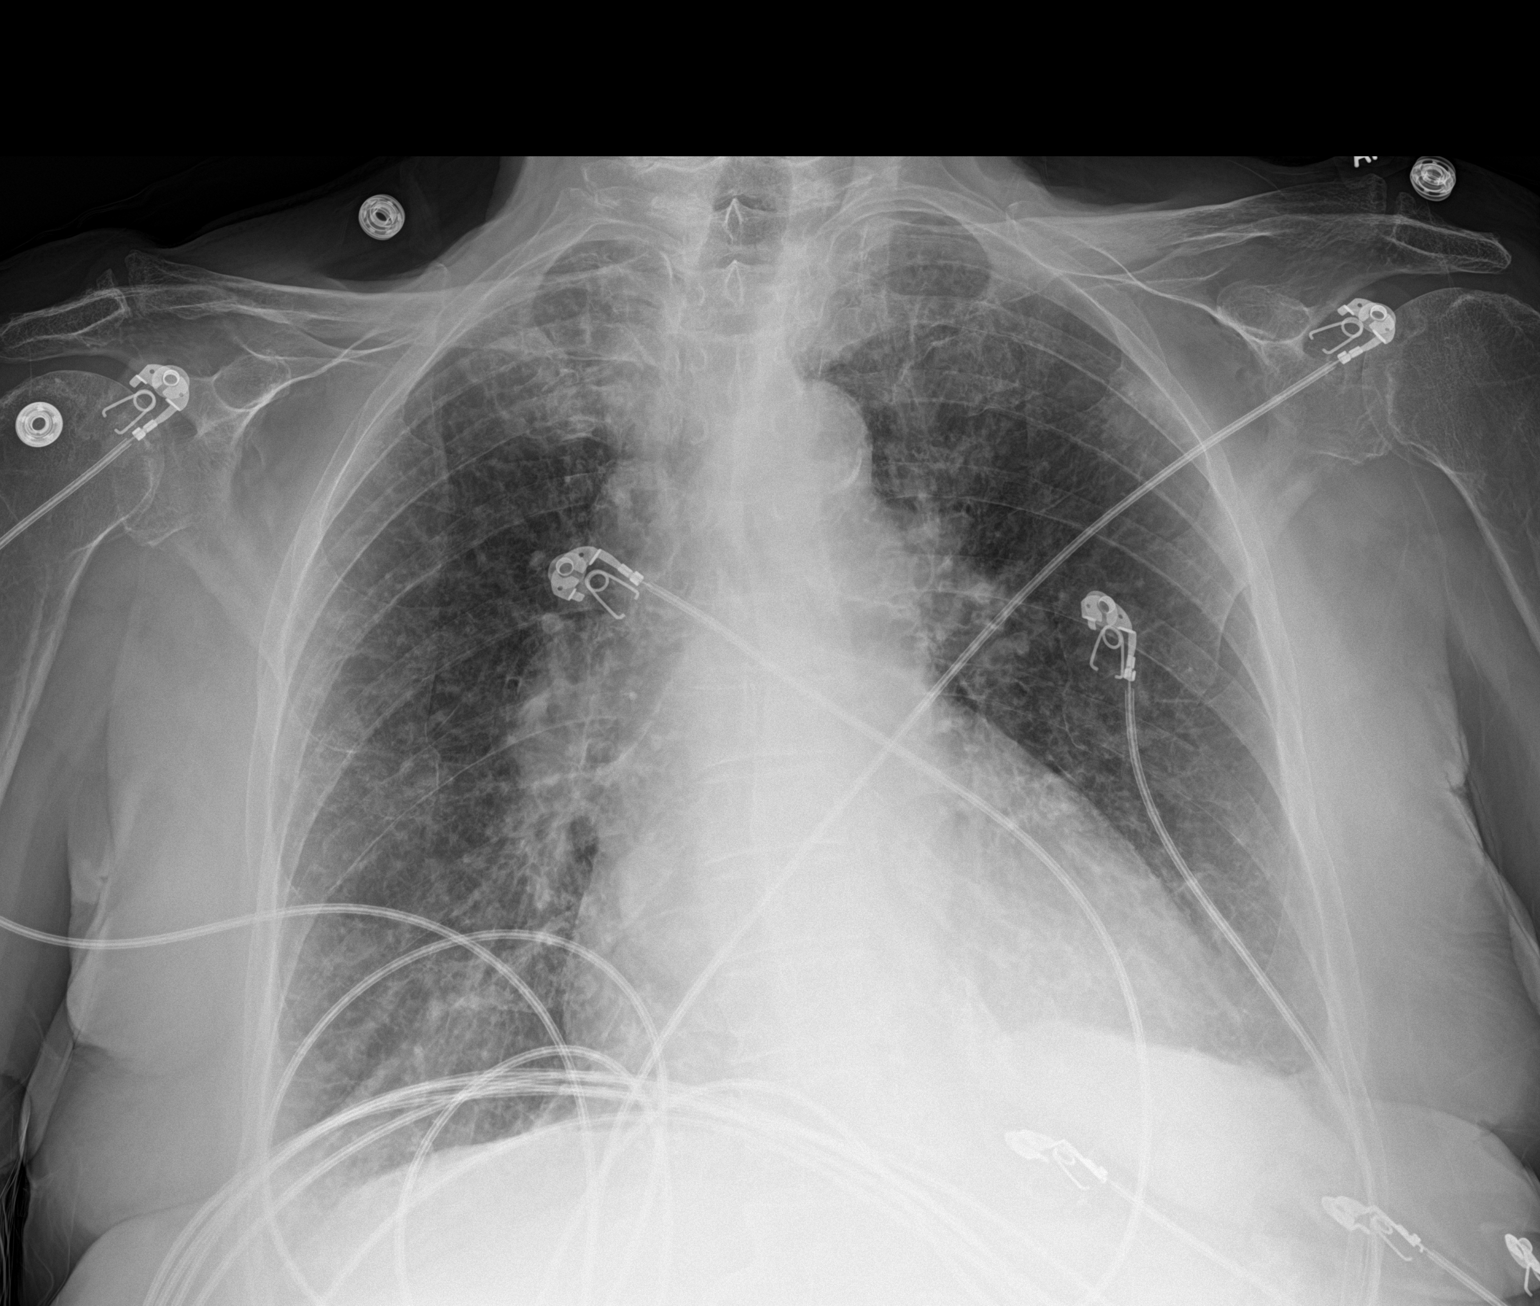

[1 of 1 positions shown; findings below may reference images not displayed]

FINDINGS: Cardiomegaly. No pulmonary venous congestion. Diffuse bilateral
interstitial prominence. Interstitial edema and/or pneumonitis could
present this fashion. Tiny bilateral pleural effusions can not be
excluded. No pneumothorax. Thoracic spine scoliosis.
IMPRESSION: 1. Cardiomegaly. No pulmonary venous congestion.
2. Diffuse bilateral interstitial prominence. Interstitial edema
and/or pneumonitis could present in this fashion.

## 2022-04-02 DIAGNOSIS — H401114 Primary open-angle glaucoma, right eye, indeterminate stage: Secondary | ICD-10-CM | POA: Diagnosis not present

## 2022-04-07 ENCOUNTER — Other Ambulatory Visit: Payer: Self-pay | Admitting: Cardiology

## 2022-04-07 DIAGNOSIS — I4891 Unspecified atrial fibrillation: Secondary | ICD-10-CM

## 2022-04-09 ENCOUNTER — Encounter (HOSPITAL_COMMUNITY): Payer: Self-pay

## 2022-04-09 ENCOUNTER — Emergency Department (HOSPITAL_COMMUNITY)
Admission: EM | Admit: 2022-04-09 | Discharge: 2022-04-09 | Disposition: A | Discharge status: Home / Self Care | Payer: Medicare PPO | Attending: Emergency Medicine | Admitting: Emergency Medicine

## 2022-04-09 DIAGNOSIS — R0689 Other abnormalities of breathing: Secondary | ICD-10-CM | POA: Diagnosis not present

## 2022-04-09 DIAGNOSIS — Z7901 Long term (current) use of anticoagulants: Secondary | ICD-10-CM | POA: Diagnosis not present

## 2022-04-09 DIAGNOSIS — I959 Hypotension, unspecified: Secondary | ICD-10-CM | POA: Diagnosis not present

## 2022-04-09 DIAGNOSIS — R040 Epistaxis: Secondary | ICD-10-CM | POA: Insufficient documentation

## 2022-04-09 DIAGNOSIS — R001 Bradycardia, unspecified: Secondary | ICD-10-CM | POA: Diagnosis not present

## 2022-04-09 DIAGNOSIS — Z79899 Other long term (current) drug therapy: Secondary | ICD-10-CM | POA: Diagnosis not present

## 2022-04-09 DIAGNOSIS — R58 Hemorrhage, not elsewhere classified: Secondary | ICD-10-CM | POA: Diagnosis not present

## 2022-04-09 LAB — CBC
HCT: 32.7 % — ABNORMAL LOW (ref 36.0–46.0)
Hemoglobin: 10.7 g/dL — ABNORMAL LOW (ref 12.0–15.0)
MCH: 30.7 pg (ref 26.0–34.0)
MCHC: 32.7 g/dL (ref 30.0–36.0)
MCV: 93.7 fL (ref 80.0–100.0)
Platelets: 251 10*3/uL (ref 150–400)
RBC: 3.49 MIL/uL — ABNORMAL LOW (ref 3.87–5.11)
RDW: 13.8 % (ref 11.5–15.5)
WBC: 7 10*3/uL (ref 4.0–10.5)
nRBC: 0 % (ref 0.0–0.2)

## 2022-04-09 MED ORDER — ONDANSETRON HCL 4 MG/2ML IJ SOLN
4.0000 mg | Freq: Once | INTRAMUSCULAR | Status: DC
  Filled 2022-04-09: qty 2

## 2022-04-09 MED ORDER — TRANEXAMIC ACID FOR EPISTAXIS
500.0000 mg | Freq: Once | TOPICAL | Status: AC
  Administered 2022-04-09: 500 mg via TOPICAL
  Filled 2022-04-09: qty 10

## 2022-04-09 MED ORDER — OXYMETAZOLINE HCL 0.05 % NA SOLN
1.0000 | Freq: Once | NASAL | Status: AC
  Administered 2022-04-09: 1 via NASAL
  Filled 2022-04-09: qty 30

## 2022-04-09 MED ORDER — SILVER NITRATE-POT NITRATE 75-25 % EX MISC
1.0000 | Freq: Once | CUTANEOUS | Status: AC
  Administered 2022-04-09: 1 via TOPICAL
  Filled 2022-04-09: qty 10

## 2022-04-09 NOTE — ED Notes (Signed)
IV was taken out by this ed techs trainer. Morrie Sheldon C. )

## 2022-04-09 NOTE — Telephone Encounter (Signed)
Prescription refill request for Eliquis received. Indication: Afib  Last office visit: 10/23/21 ([place])  Scr: 0.95 (10/23/21)  Age: 88 Weight: 72.8kg  Appropriate dose and refill sent to requested pharmacy.

## 2022-04-09 NOTE — ED Provider Notes (Signed)
[facility]EMERGENCY DEPT Provider Note   CSN: [id number] Arrival date & time: 04/09/22  1229     History  Chief Complaint  Patient presents with   Epistaxis    Regina Taylor is a 88 y.o. female.  Patient is a 88 year old female with a past medical history of A-fib on Eliquis presenting to the emergency department with a nosebleed.  Patient states that she has chronic rhinorrhea and was wiping her nose this morning when it started to bleed.  She states it was initially bleeding out of her left nare and then started to notice a small bleeding amount out of the right and in her throat.  She denies any lightheadedness or dizziness, chest pain or shortness of breath.  The history is provided by the patient.  Epistaxis      Home Medications Prior to Admission medications   Medication Sig Start Date End Date Taking? Authorizing Provider  amiodarone (PACERONE) 200 MG tablet Take 1/2 tablet ( 100 mg ) daily for 6 days, hold on Sunday. 10/23/21   [place], Peter M, MD  Artificial Tear Solution (BION TEARS OP) Apply 1 drop to eye daily as needed (dry eyes).    [provider]  BION TEARS PF 0.1-0.3 % SOLN SMARTSIG:1 Drop(s) Right Eye PRN 10/02/21   [provider]  COSOPT PF 2-0.5 % SOLN ophthalmic solution Place 1 drop into the right eye 2 (two) times daily. 10/02/21   [provider]  diltiazem (CARDIZEM CD) 240 MG 24 hr capsule TAKE ONE CAPSULE BY MOUTH DAILY 01/12/22   [place], Peter M, MD  ELIQUIS 5 MG TABS tablet TAKE 1 TABLET BY MOUTH TWICE DAILY. 04/09/22   [place], Peter M, MD  hypromellose (GENTEAL) 0.3 % GEL ophthalmic ointment Place 1 drop into the right eye at bedtime.    [provider]  losartan (COZAAR) 25 MG tablet Take 1 tablet (25 mg total) by mouth daily. 03/31/21 03/26/22  [place], Peter M, MD  metoprolol succinate (TOPROL-XL) 50 MG 24 hr tablet Take 1 tablet (50 mg total) by mouth daily. Please keep scheduled appointment  for further refills 03/09/22   [place], Peter M, MD  Multiple Vitamins-Minerals (ICAPS PO) Take 1 capsule by mouth daily.    [provider]  Red Yeast Rice Extract (RED YEAST RICE PO) Take 1 tablet by mouth daily.    [provider]  REFRESH CELLUVISC 1 % GEL Place 1 drop into the right eye at bedtime. 10/02/21   [provider]  XIIDRA 5 % SOLN Place 1 drop into the right eye 2 (two) times daily. 01/16/21   [provider]      Allergies    Shellfish-derived products, Iodinated contrast media, Latanoprost, and Xalatan    Review of Systems   Review of Systems  HENT:  Positive for nosebleeds.     Physical Exam Updated Vital Signs BP 130/80   Pulse (!) 59   Temp 97.6 F (36.4 C)   Resp 18   Ht 5\' 4"  (1.626 m)   Wt 72.6 kg   SpO2 100%   BMI 27.46 kg/m  Physical Exam Vitals and nursing note reviewed.  Constitutional:      General: She is not in acute distress.    Appearance: Normal appearance.  HENT:     Head: Normocephalic and atraumatic.     Nose:     Comments: Large clot in left nare with active surrounding oozing, large clot in posterior oropharynx, dried  blood around right nare    Mouth/Throat:     Mouth: Mucous membranes are moist.  Eyes:     Extraocular Movements: Extraocular movements intact.     Conjunctiva/sclera: Conjunctivae normal.  Cardiovascular:     Pulses: Normal pulses.  Pulmonary:     Effort: Pulmonary effort is normal.  Abdominal:     General: Abdomen is flat.  Musculoskeletal:        General: Normal range of motion.     Cervical back: Normal range of motion and neck supple.  Skin:    General: Skin is warm and dry.  Neurological:     General: No focal deficit present.     Mental Status: She is alert and oriented to person, place, and time.  Psychiatric:        Mood and Affect: Mood normal.        Behavior: Behavior normal.     ED Results / Procedures / Treatments   Labs (all labs ordered are listed, but  only abnormal results are displayed) Labs Reviewed  CBC - Abnormal; Notable for the following components:      Result Value   RBC 3.49 (*)    Hemoglobin 10.7 (*)    HCT 32.7 (*)    All other components within normal limits    EKG None  Radiology No results found.  Procedures .Epistaxis Management  Date/Time: 04/09/2022 2:08 PM  Performed by: Rexford Maus, DO Authorized by: Rexford Maus, DO   Consent:    Consent obtained:  Verbal   Consent given by:  Patient   Risks, benefits, and alternatives were discussed: yes     Risks discussed:  Bleeding, nasal injury, pain and infection   Alternatives discussed:  No treatment, delayed treatment and observation Universal protocol:    Procedure explained and questions answered to patient or proxy's satisfaction: yes     Patient identity confirmed:  Verbally with patient Anesthesia:    Anesthesia method:  None Procedure details:    Treatment site:  L anterior and R anterior   Treatment method:  Nasal tampon and silver nitrate (Silver nitrate in R nare. TXA and nasal tampon in L nare)   Treatment complexity:  Extensive   Treatment episode: initial   Post-procedure details:    Assessment:  Bleeding stopped   Procedure completion:  Tolerated well, no immediate complications     Medications Ordered in ED Medications  ondansetron (ZOFRAN) injection 4 mg (0 mg Intravenous Hold 04/09/22 1417)  oxymetazoline (AFRIN) 0.05 % nasal spray 1 spray (1 spray Each Nare Given 04/09/22 1308)  tranexamic acid (CYKLOKAPRON) 1000 MG/10ML topical solution 500 mg (500 mg Topical Given 04/09/22 1416)  silver nitrate applicators applicator 1 Stick (1 Stick Topical Given by Other 04/09/22 1502)    ED Course/ Medical Decision Making/ A&P Clinical Course as of 04/09/22 1648  Mon Apr 09, 2022  1453 Upon reassessment, the patient had small amount of oozing from the right nare and I was able to visualize a single spot on the anterior  Kesselbach this would be amenable to silver nitrate cautery.  Her bleeding from her left nare is otherwise resolved.  No active bleeding in the posterior oropharynx. [VK]  1525 On reassessment, bleeding has stopped out of the right nare, however she is continuing to have bleeding out of her left nare.  Rhino Rocket soaked in TXA was placed in the left nare.  She will be reassessed and if she is having continued bleeding may  need posterior packing. [VK]  1609 Upon reassessment, the patient's bleeding has resolved.  She has no blood in her right nare and no blood in the posterior oropharynx.  Patient stable for discharge home with packing in place and will be given ENT follow-up and strict return precautions. [VK]    Clinical Course User Index [VK] Rexford Maus, DO                           Medical Decision Making This patient presents to the ED with chief complaint(s) of nosebleed with pertinent past medical history of A-fib on Eliquis which further complicates the presenting complaint. The complaint involves an extensive differential diagnosis and also carries with it a high risk of complications and morbidity.    The differential diagnosis includes anterior nosebleed, posterior nosebleed, anemia, no history of trauma making fracture unlikely  Additional history obtained: Additional history obtained from family Records reviewed N/A  ED Course and Reassessment: On patient's arrival to the emergency department she was awake and alert and hemodynamically stable.  She denies any symptoms of anemia but CBC was performed and her hemoglobin is at her baseline.  The patient did have active oozing out of the left nare with a large clot extended to the posterior oropharynx.  The patient attempted to blow the clot out and I was able to remove some of the clot with alligator forceps.  The patient then had Afrin sprayed into bilateral nares and was instructed to hold pressure for 15 minutes.  She will  be reassessed for continued bleeding and if we are able to localize the source of the bleeding.  Independent labs interpretation:  The following labs were independently interpreted: Hemoglobin at baseline  Independent visualization of imaging: N/A  Consultation: - Consulted or discussed management/test interpretation w/ external professional: N/A  Consideration for admission or further workup: Patient has no emergent conditions requiring admission or further work-up at this time and is stable for discharge home with outpatient ENT follow-up Social Determinants of health: N/A    Amount and/or Complexity of Data Reviewed Labs: ordered.  Risk OTC drugs. Prescription drug management.          Final Clinical Impression(s) / ED Diagnoses Final diagnoses:  Anterior epistaxis    Rx / DC Orders ED Discharge Orders     None         Rexford Maus, DO 04/09/22 1648

## 2022-04-09 NOTE — ED Triage Notes (Signed)
Pt BIBA from home. Pt c/o 2 hours of nose bleeding after wiping it. Pt takes Eliquis (no falls). No dizziness/nausea.   192/84 92 HR 98% RA  Pt took home meds this morning, including Eliquis and olmesartan

## 2022-04-09 NOTE — Discharge Instructions (Addendum)
You were seen in the emergency department for your nosebleed.  You had a small area of bleeding on the right side of your nose that we treated with the silver nitrate cautery stick.  You had significant oozing from the left side of your nose and we had to place packing with a nasal tampon.  You should stay in place for the next 2 to 3 days to ensure that your bleeding will completely stop.  You can follow-up in the ENT clinic to have this removed and to have your nose rechecked.  Return to the emergency department if you have continued bleeding, your packing comes out, you become lightheaded or pass out or if you have any other new or concerning symptoms.

## 2022-04-16 DIAGNOSIS — Z7901 Long term (current) use of anticoagulants: Secondary | ICD-10-CM | POA: Insufficient documentation

## 2022-04-16 DIAGNOSIS — R040 Epistaxis: Secondary | ICD-10-CM | POA: Insufficient documentation

## 2022-04-18 ENCOUNTER — Telehealth: Payer: Self-pay

## 2022-04-18 NOTE — Telephone Encounter (Signed)
     Patient  visit on 11/20  at [facility]   Have you been able to follow up with your primary care physician? Yes   The patient was or was not able to obtain any needed medicine or equipment. Yes   Are there diet recommendations that you are having difficulty following? Na   Patient expresses understanding of discharge instructions and education provided has no other needs at this time.  Yes      Lenard Forth [facility] Guide, [facility], Care Management  [phone] [address] [place], [place], [place] [address] Phone: [phone] Email: [email or web address]

## 2022-04-24 DIAGNOSIS — H18891 Other specified disorders of cornea, right eye: Secondary | ICD-10-CM | POA: Diagnosis not present

## 2022-04-24 DIAGNOSIS — H16231 Neurotrophic keratoconjunctivitis, right eye: Secondary | ICD-10-CM | POA: Diagnosis not present

## 2022-04-24 DIAGNOSIS — H04121 Dry eye syndrome of right lacrimal gland: Secondary | ICD-10-CM | POA: Diagnosis not present

## 2022-04-24 DIAGNOSIS — H401114 Primary open-angle glaucoma, right eye, indeterminate stage: Secondary | ICD-10-CM | POA: Diagnosis not present

## 2022-04-24 DIAGNOSIS — H18421 Band keratopathy, right eye: Secondary | ICD-10-CM | POA: Diagnosis not present

## 2022-04-24 DIAGNOSIS — Z79899 Other long term (current) drug therapy: Secondary | ICD-10-CM | POA: Diagnosis not present

## 2022-04-24 DIAGNOSIS — Z970 Presence of artificial eye: Secondary | ICD-10-CM | POA: Diagnosis not present

## 2022-04-24 NOTE — Progress Notes (Unsigned)
f    Cardiology Office Note   Date:  04/24/2022   ID:  Adasyn, Mcadams [birth year], MRN [id number]  PCP:  Merri Brunette, MD  Cardiologist:   Reva Pinkley [place], MD   No chief complaint on file.      History of Present Illness: Regina Taylor is a 88 y.o. female who presents for follow up Afib. She has a PMH of SVT, GERD, atrial fibrillation, hyponatremia, diastolic CHF, and glaucoma.   She was  admitted to the hospital 03/17/2020. She presented to the emergency department with shortness of breath and orthopnea.    She  was found to have A. fib with RVR.  Her echocardiogram showed an EF of 60-65%, moderate LVH, mild MR, AR, mild aortic valve sclerosis, mild pulmonary hypertension and severe LAE.  She was placed on Eliquis 5 mg twice daily, and rate control with Cardizem and  Metoprolol. She was diuresed but not continued on diuretics.   She was seen in January with some increased swelling. Given lasix for 3 days and instructed on low sodium diet.    She continued to have significant symptoms of dyspnea with any exertion. After discussion of treatment options she was started on amiodarone.  She subsequently underwent elective DCCV on 08/25/20. She did develop GI side effects on amiodarone so her dose was reduced to 100 mg daily. This did help her GI side effects and she is tolerating well. She still notes a lack of energy and some SOB. Is limited by arthritis in her hip.   She was seen in the ED on 04/09/22 for nosebleed treated with Rhinorocket.   On follow up today she is seen with her son. She notes she has a little nausea in the am. She has SOB. Sometimes gets SOB walking across the room. Has bad arthritis in her left leg that limits her mobility. No edema. Weight is down 2 lbs.     Past Medical History:  Diagnosis Date   Arthritis    Blindness of left eye    Fatigue    GERD (gastroesophageal reflux disease)    Glaucoma    Macular degeneration    MVP (mitral valve prolapse)     Palpitations    SVT (supraventricular tachycardia)     Past Surgical History:  Procedure Laterality Date   CARDIOVERSION N/A 08/25/2020   Procedure: CARDIOVERSION;  Surgeon: Little Ishikawa, MD;  [facility] ENDOSCOPY;  Service: Cardiovascular;  Laterality: N/A;   CATARAC     CATARACT EXTRACTION     PARTIAL HIP ARTHROPLASTY     RETINAL DETACHMENT SURGERY     TOTAL HIP ARTHROPLASTY Right 1990     Current Outpatient Medications  Medication Sig Dispense Refill   amiodarone (PACERONE) 200 MG tablet Take 1/2 tablet ( 100 mg ) daily for 6 days, hold on Sunday. 90 tablet 3   Artificial Tear Solution (BION TEARS OP) Apply 1 drop to eye daily as needed (dry eyes).     BION TEARS PF 0.1-0.3 % SOLN SMARTSIG:1 Drop(s) Right Eye PRN     COSOPT PF 2-0.5 % SOLN ophthalmic solution Place 1 drop into the right eye 2 (two) times daily.     diltiazem (CARDIZEM CD) 240 MG 24 hr capsule TAKE ONE CAPSULE BY MOUTH DAILY 90 capsule 3   ELIQUIS 5 MG TABS tablet TAKE 1 TABLET BY MOUTH TWICE DAILY. 180 tablet 1   hypromellose (GENTEAL) 0.3 % GEL ophthalmic ointment Place 1 drop into the right eye  at bedtime.     losartan (COZAAR) 25 MG tablet Take 1 tablet (25 mg total) by mouth daily. 90 tablet 3   metoprolol succinate (TOPROL-XL) 50 MG 24 hr tablet Take 1 tablet (50 mg total) by mouth daily. Please keep scheduled appointment for further refills 60 tablet 0   Multiple Vitamins-Minerals (ICAPS PO) Take 1 capsule by mouth daily.     Red Yeast Rice Extract (RED YEAST RICE PO) Take 1 tablet by mouth daily.     REFRESH CELLUVISC 1 % GEL Place 1 drop into the right eye at bedtime.     XIIDRA 5 % SOLN Place 1 drop into the right eye 2 (two) times daily.     No current facility-administered medications for this visit.    Allergies:   Shellfish-derived products, Iodinated contrast media, Latanoprost, and Xalatan    Social History:  The patient  reports that she quit smoking about 42 years ago. Her smoking  use included cigarettes. She has never used smokeless tobacco. She reports current alcohol use. She reports that she does not use drugs.   Family History:  The patient's family history includes Cancer in her mother; Heart attack in her brother; Heart disease in her father.    ROS:  Please see the history of present illness.   Otherwise, review of systems are positive for none.   All other systems are reviewed and negative.    PHYSICAL EXAM: VS:  There were no vitals taken for this visit. , BMI There is no height or weight on file to calculate BMI. GEN: Well nourished, well developed, in no acute distress  HEENT: normal  Neck: no JVD, carotid bruits, or masses Cardiac: RRR; no murmurs, rubs, or gallops, 1+ edema - support stockings in place.  Respiratory:  clear to auscultation bilaterally, normal work of breathing GI: soft, nontender, nondistended, + BS MS: no deformity or atrophy  Skin: warm and dry, no rash Neuro:  Strength and sensation are intact Psych: euthymic mood, full affect   EKG:  EKG is ordered today. NSR with occ. PVC. Rate 51. Nonspecific ST abnormality. I have personally reviewed and interpreted this study.      Recent Labs: 10/23/2021: ALT 17; BUN 23; Creatinine, Ser 0.95; Potassium 5.1; Sodium 135; TSH 0.792 04/09/2022: Hemoglobin 10.7; Platelets 251  Dated 09/16/19: cholesterol 194, triglycerides 108, HDL 49, LDL 125.  Dated 06/12/21: cholesterol 226, triglycerides 106, HDL 62, LDL 145. CMET normal.  Lipid Panel    Component Value Date/Time   CHOL 147 01/25/2014 2340   TRIG 63 01/25/2014 2340   HDL 47 01/25/2014 2340   CHOLHDL 3.1 01/25/2014 2340   VLDL 13 01/25/2014 2340   LDLCALC 87 01/25/2014 2340    Dated 06/13/20: Normal CMET and CBC Dated 06/12/21: cholesterol 226, triglycerides 106, HDL 62, LDL 145 Fsyrf 02/05/22:CMET mormal DAted 04/09/22: CBC normal. Wt Readings from Last 3 Encounters:  04/09/22 160 lb (72.6 kg)  10/23/21 160 lb 6.4 oz (72.8 kg)   03/31/21 162 lb 6.4 oz (73.7 kg)      Other studies Reviewed: Additional studies/ records that were reviewed today include:   Echo 03/18/20: IMPRESSIONS     1. Left ventricular ejection fraction, by estimation, is 60 to 65%. The  left ventricle has normal function. The left ventricle has no regional  wall motion abnormalities. There is moderate concentric left ventricular  hypertrophy. Left ventricular  diastolic function could not be evaluated.   2. Right ventricular systolic function is normal. The  right ventricular  size is normal. There is moderately elevated pulmonary artery systolic  pressure. The estimated right ventricular systolic pressure is 45.2 mmHg.   3. Left atrial size was severely dilated.   4. Right atrial size was mildly dilated.   5. The mitral valve is degenerative. Trivial mitral valve regurgitation.  No evidence of mitral stenosis.   6. The aortic valve is tricuspid. There is mild calcification of the  aortic valve. There is mild thickening of the aortic valve. Aortic valve  regurgitation is mild. Mild aortic valve sclerosis is present, with no  evidence of aortic valve stenosis.   7. The inferior vena cava is dilated in size with <50% respiratory  variability, suggesting right atrial pressure of 15 mmHg.   Comparison(s): Changes from prior study are noted. In Afib with RVR. EF  remains the same. RVSP ~45 mmHG on this study  ASSESSMENT AND PLAN:   1.  Atrial fibrillation -persistent  - severe LAE - on metoprolol and Cardizem for rate control.  - on  apixaban for anticoagulation.  - has been on amiodarone and is maintaining NSR - s/p DCCV on 4/7//22 - needs follow up lab work today on amiodarone and Eliquis.  - will reduce amiodarone to 100 mg 6 days a week. Omit one day.   2. Chronic diastolic CHF-   Chronic class 2-3 symptoms. Continue metoprolol, Cardizem Does not appear volume overloaded continue support stocking Daily weights Sodium  restriction Discussed adding an SGLT 2 inhibitor but she would like to avoid taking additional medication.     3. Hypertension- BP improved since we started on losartan.     Disposition:   FU 6 months  Signed, Fabricio Endsley [place], MD  04/24/2022 1:18 PM    [facility] [address] [place], [place], [address] Phone [phone], Fax [phone]

## 2022-04-26 ENCOUNTER — Encounter: Payer: Self-pay | Admitting: Cardiology

## 2022-04-26 ENCOUNTER — Ambulatory Visit: Payer: Medicare PPO | Attending: Cardiology | Admitting: Cardiology

## 2022-04-26 VITALS — BP 146/60 | HR 60 | Ht 64.5 in | Wt 155.6 lb

## 2022-04-26 DIAGNOSIS — I480 Paroxysmal atrial fibrillation: Secondary | ICD-10-CM | POA: Diagnosis not present

## 2022-04-26 DIAGNOSIS — I5032 Chronic diastolic (congestive) heart failure: Secondary | ICD-10-CM | POA: Diagnosis not present

## 2022-05-07 ENCOUNTER — Other Ambulatory Visit: Payer: Self-pay | Admitting: Cardiology

## 2022-06-28 ENCOUNTER — Other Ambulatory Visit: Payer: Self-pay | Admitting: Cardiology

## 2022-06-28 IMAGING — RF DG ESOPHAGUS
6 series · 14 of 17 positions shown · non-contrast
Comparison: None.

CLINICAL DATA: Esophageal dysphasia

EXAM:
ESOPHOGRAM / BARIUM SWALLOW / BARIUM TABLET STUDY
TECHNIQUE: Combined double contrast and single contrast examination performed
using effervescent crystals, thick barium liquid, and thin barium
liquid. The patient was observed with fluoroscopy swallowing a 13 mm
barium sulphate tablet.
FLUOROSCOPY TIME:  Fluoroscopy Time:  1 minutes 54 seconds
Radiation Exposure Index (if provided by the fluoroscopic device):
18 mGy
Number of Acquired Spot Images: 0

[Series 1: sequence · 3 of 16 frames shown (1 of 3)]
[frame 1/16]
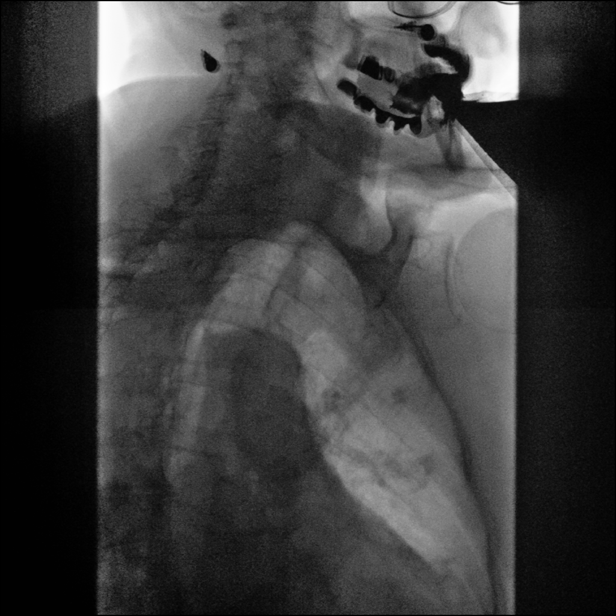
[frame 3/16]
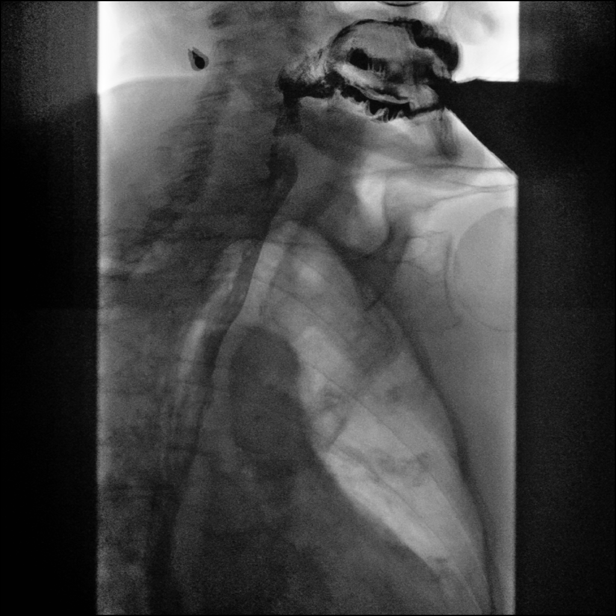
[frame 14/16]
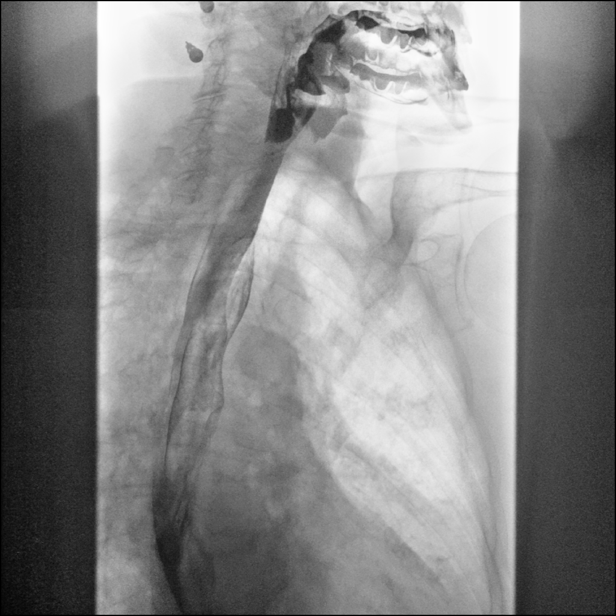

[Series 2: one shot · 3 of 3 slices shown (1 of 3)]
[im 1/3]
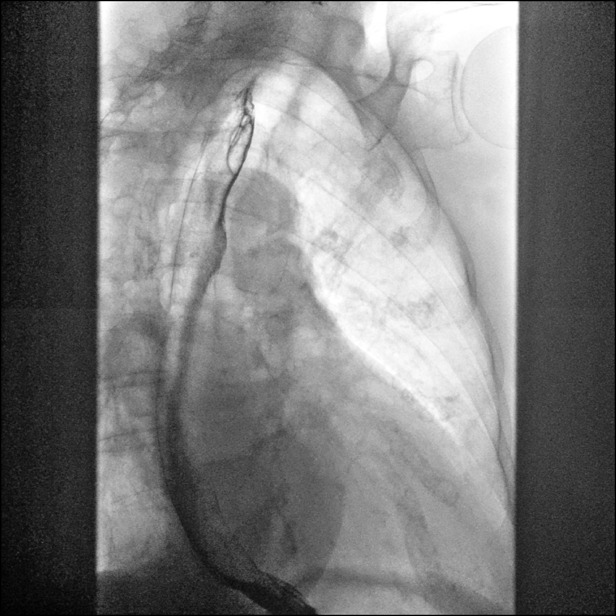
[im 2/3]
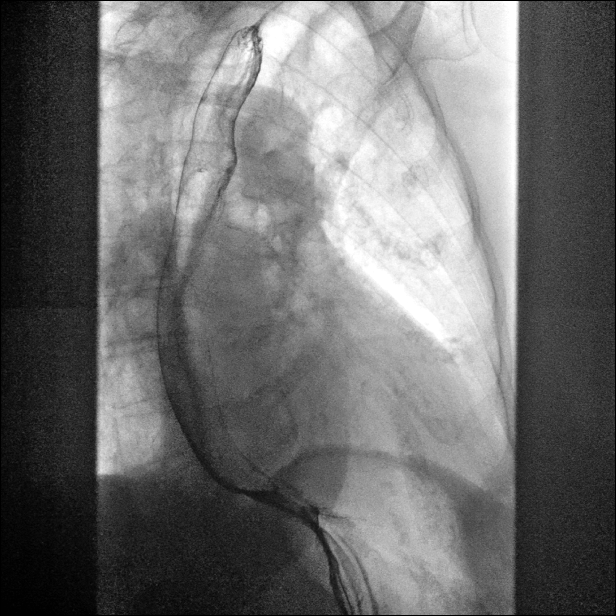
[im 3/3]
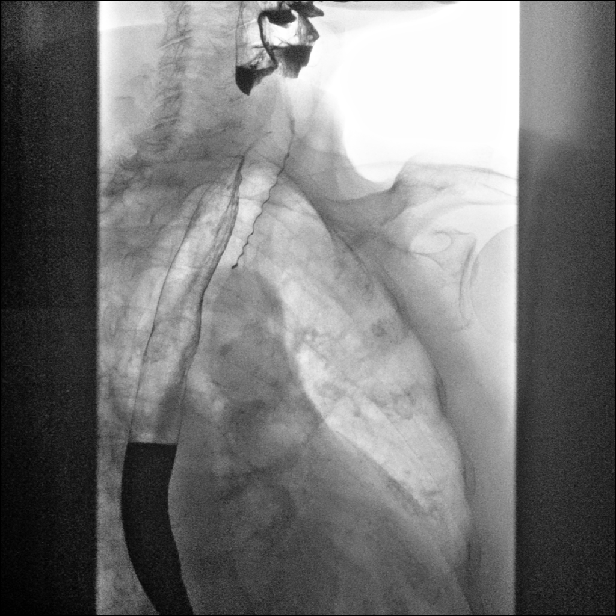

[Series 3: sequence · 3 of 17 frames shown (2 of 3)]
[frame 3/17]
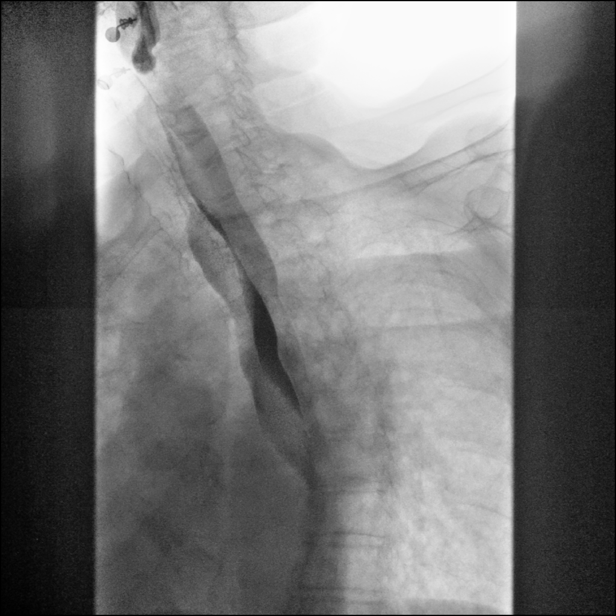
[frame 15/17]
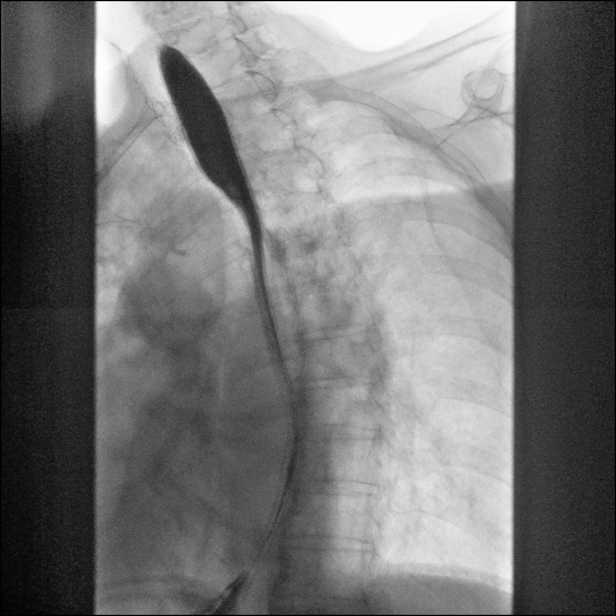
[frame 16/17]
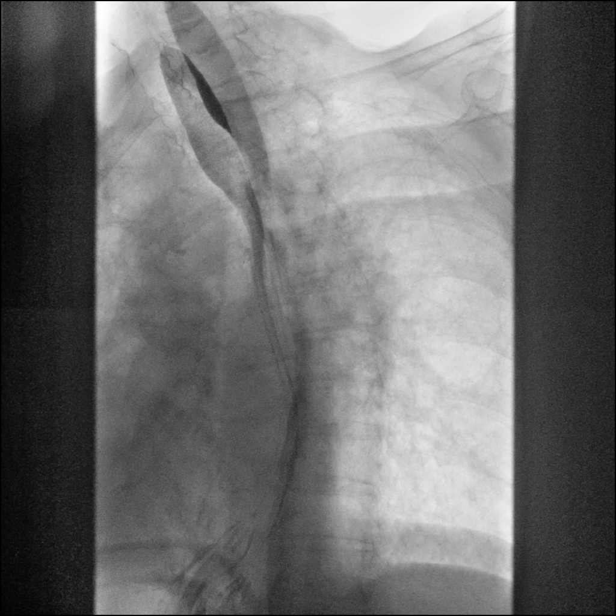

[Series 4: one shot · 1 of 1 slices shown (2 of 3)]
[im 1/1]
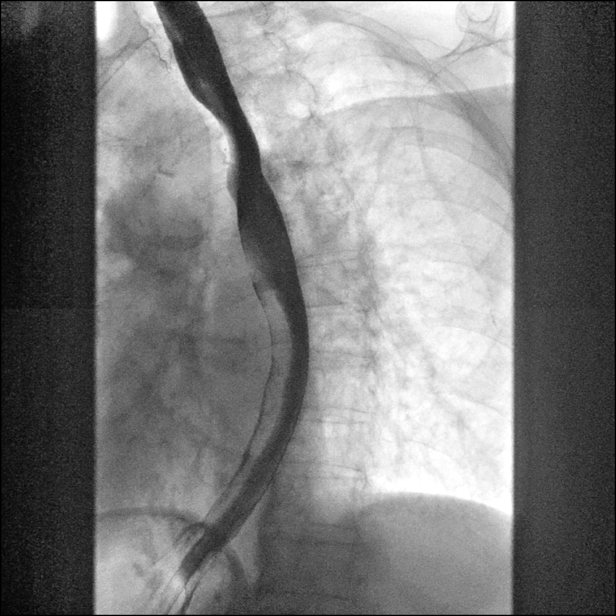

[Series 5: sequence · 3 of 30 frames shown (3 of 3)]
[frame 5/30]
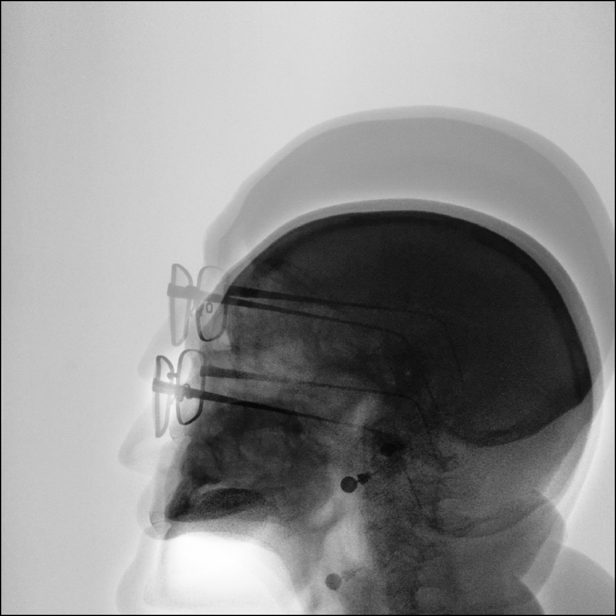
[frame 16/30]
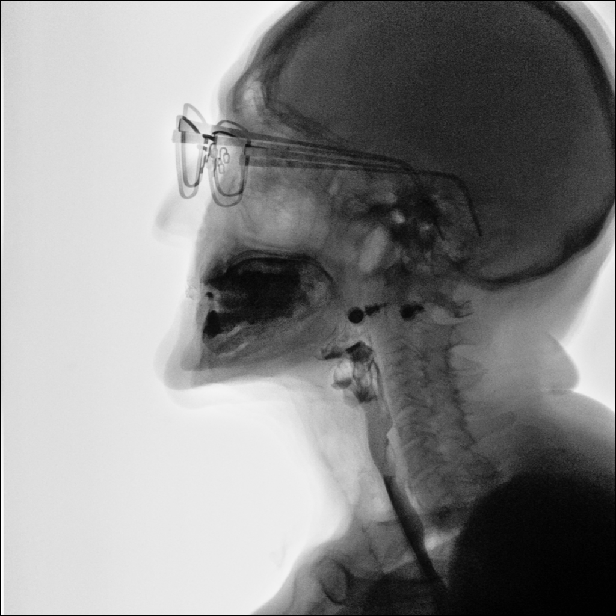
[frame 27/30]
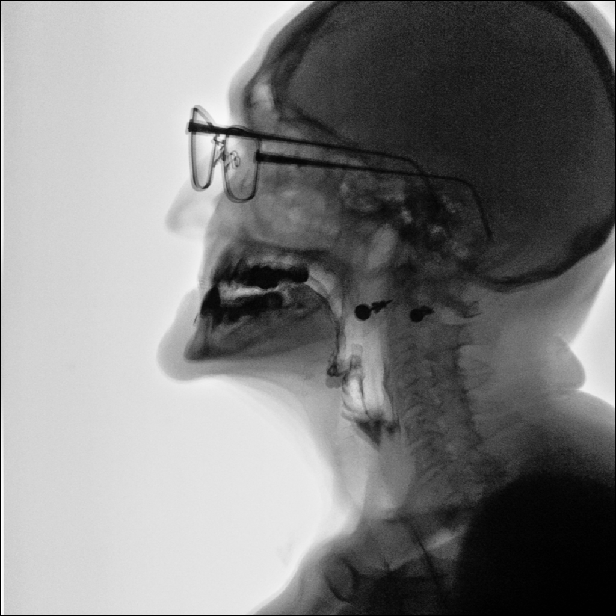

[Series 6: one shot · 1 of 1 slices shown (3 of 3)]
[im 1/1]
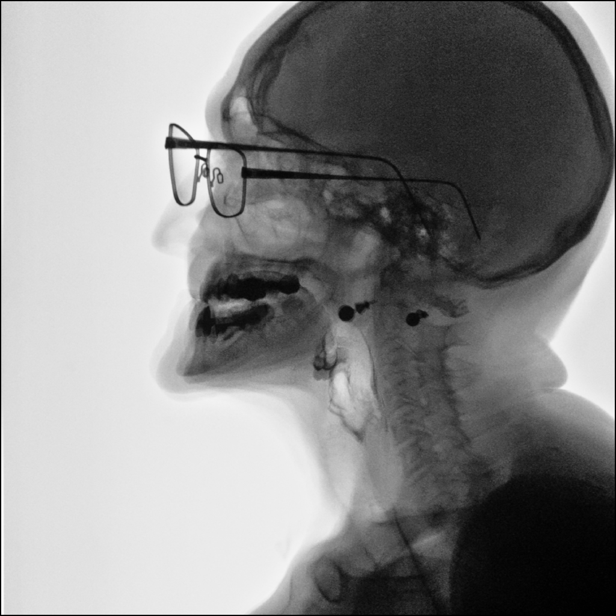

[14 of 17 positions shown; findings below may reference images not displayed]

FINDINGS: The esophagus is patent. No stricture or mass identified. A 13 mm
barium tablet was ingested which easily passed through the esophagus
and into the stomach. Mild esophageal dysmotility with several non
propulsive tertiary waves resulting in stasis of the enteric
contrast material within the proximal and mid esophagus.

During the exam a trace amount of thick barium was aspirated into
the trachea. The swallowing mechanism was subsequently evaluated
with thin barium and no additional episodes of reflux were noted.

No hiatal hernia identified.  No reflux noted.
IMPRESSION: 1. No stricture or mass identified.
2. 1 episode of mild aspiration noted with small volume of barium
entering the proximal trachea. Consider further evaluation with
dedicated oropharyngeal motility consultation
3. Mild esophageal dysmotility.

## 2022-07-03 DIAGNOSIS — E7800 Pure hypercholesterolemia, unspecified: Secondary | ICD-10-CM | POA: Diagnosis not present

## 2022-07-03 DIAGNOSIS — N3281 Overactive bladder: Secondary | ICD-10-CM | POA: Diagnosis not present

## 2022-07-03 DIAGNOSIS — K625 Hemorrhage of anus and rectum: Secondary | ICD-10-CM | POA: Diagnosis not present

## 2022-07-03 DIAGNOSIS — I4819 Other persistent atrial fibrillation: Secondary | ICD-10-CM | POA: Diagnosis not present

## 2022-07-03 DIAGNOSIS — Z0000 Encounter for general adult medical examination without abnormal findings: Secondary | ICD-10-CM | POA: Diagnosis not present

## 2022-07-03 DIAGNOSIS — M25552 Pain in left hip: Secondary | ICD-10-CM | POA: Diagnosis not present

## 2022-07-03 DIAGNOSIS — Z1331 Encounter for screening for depression: Secondary | ICD-10-CM | POA: Diagnosis not present

## 2022-07-03 DIAGNOSIS — I10 Essential (primary) hypertension: Secondary | ICD-10-CM | POA: Diagnosis not present

## 2022-07-03 DIAGNOSIS — L989 Disorder of the skin and subcutaneous tissue, unspecified: Secondary | ICD-10-CM | POA: Diagnosis not present

## 2022-07-17 DIAGNOSIS — M1612 Unilateral primary osteoarthritis, left hip: Secondary | ICD-10-CM | POA: Diagnosis not present

## 2022-07-19 DIAGNOSIS — H547 Unspecified visual loss: Secondary | ICD-10-CM | POA: Diagnosis not present

## 2022-07-19 DIAGNOSIS — H409 Unspecified glaucoma: Secondary | ICD-10-CM | POA: Diagnosis not present

## 2022-07-19 DIAGNOSIS — I341 Nonrheumatic mitral (valve) prolapse: Secondary | ICD-10-CM | POA: Diagnosis not present

## 2022-07-19 DIAGNOSIS — M1612 Unilateral primary osteoarthritis, left hip: Secondary | ICD-10-CM | POA: Diagnosis not present

## 2022-07-19 DIAGNOSIS — D509 Iron deficiency anemia, unspecified: Secondary | ICD-10-CM | POA: Diagnosis not present

## 2022-07-19 DIAGNOSIS — I471 Supraventricular tachycardia, unspecified: Secondary | ICD-10-CM | POA: Diagnosis not present

## 2022-07-19 DIAGNOSIS — R1314 Dysphagia, pharyngoesophageal phase: Secondary | ICD-10-CM | POA: Diagnosis not present

## 2022-07-19 DIAGNOSIS — I4819 Other persistent atrial fibrillation: Secondary | ICD-10-CM | POA: Diagnosis not present

## 2022-07-19 DIAGNOSIS — I10 Essential (primary) hypertension: Secondary | ICD-10-CM | POA: Diagnosis not present

## 2022-07-25 DIAGNOSIS — I4819 Other persistent atrial fibrillation: Secondary | ICD-10-CM | POA: Diagnosis not present

## 2022-07-25 DIAGNOSIS — D509 Iron deficiency anemia, unspecified: Secondary | ICD-10-CM | POA: Diagnosis not present

## 2022-07-25 DIAGNOSIS — H547 Unspecified visual loss: Secondary | ICD-10-CM | POA: Diagnosis not present

## 2022-07-25 DIAGNOSIS — I341 Nonrheumatic mitral (valve) prolapse: Secondary | ICD-10-CM | POA: Diagnosis not present

## 2022-07-25 DIAGNOSIS — H409 Unspecified glaucoma: Secondary | ICD-10-CM | POA: Diagnosis not present

## 2022-07-25 DIAGNOSIS — I471 Supraventricular tachycardia, unspecified: Secondary | ICD-10-CM | POA: Diagnosis not present

## 2022-07-25 DIAGNOSIS — I10 Essential (primary) hypertension: Secondary | ICD-10-CM | POA: Diagnosis not present

## 2022-07-25 DIAGNOSIS — R1314 Dysphagia, pharyngoesophageal phase: Secondary | ICD-10-CM | POA: Diagnosis not present

## 2022-07-25 DIAGNOSIS — M1612 Unilateral primary osteoarthritis, left hip: Secondary | ICD-10-CM | POA: Diagnosis not present

## 2022-07-27 DIAGNOSIS — I341 Nonrheumatic mitral (valve) prolapse: Secondary | ICD-10-CM | POA: Diagnosis not present

## 2022-07-27 DIAGNOSIS — I10 Essential (primary) hypertension: Secondary | ICD-10-CM | POA: Diagnosis not present

## 2022-07-27 DIAGNOSIS — D509 Iron deficiency anemia, unspecified: Secondary | ICD-10-CM | POA: Diagnosis not present

## 2022-07-27 DIAGNOSIS — I471 Supraventricular tachycardia, unspecified: Secondary | ICD-10-CM | POA: Diagnosis not present

## 2022-07-27 DIAGNOSIS — R1314 Dysphagia, pharyngoesophageal phase: Secondary | ICD-10-CM | POA: Diagnosis not present

## 2022-07-27 DIAGNOSIS — H409 Unspecified glaucoma: Secondary | ICD-10-CM | POA: Diagnosis not present

## 2022-07-27 DIAGNOSIS — H547 Unspecified visual loss: Secondary | ICD-10-CM | POA: Diagnosis not present

## 2022-07-27 DIAGNOSIS — M1612 Unilateral primary osteoarthritis, left hip: Secondary | ICD-10-CM | POA: Diagnosis not present

## 2022-07-27 DIAGNOSIS — I4819 Other persistent atrial fibrillation: Secondary | ICD-10-CM | POA: Diagnosis not present

## 2022-07-30 ENCOUNTER — Other Ambulatory Visit: Payer: Self-pay | Admitting: Cardiology

## 2022-07-31 DIAGNOSIS — I10 Essential (primary) hypertension: Secondary | ICD-10-CM | POA: Diagnosis not present

## 2022-07-31 DIAGNOSIS — I471 Supraventricular tachycardia, unspecified: Secondary | ICD-10-CM | POA: Diagnosis not present

## 2022-07-31 DIAGNOSIS — H409 Unspecified glaucoma: Secondary | ICD-10-CM | POA: Diagnosis not present

## 2022-07-31 DIAGNOSIS — R1314 Dysphagia, pharyngoesophageal phase: Secondary | ICD-10-CM | POA: Diagnosis not present

## 2022-07-31 DIAGNOSIS — H547 Unspecified visual loss: Secondary | ICD-10-CM | POA: Diagnosis not present

## 2022-07-31 DIAGNOSIS — M1612 Unilateral primary osteoarthritis, left hip: Secondary | ICD-10-CM | POA: Diagnosis not present

## 2022-07-31 DIAGNOSIS — D509 Iron deficiency anemia, unspecified: Secondary | ICD-10-CM | POA: Diagnosis not present

## 2022-07-31 DIAGNOSIS — I341 Nonrheumatic mitral (valve) prolapse: Secondary | ICD-10-CM | POA: Diagnosis not present

## 2022-07-31 DIAGNOSIS — I4819 Other persistent atrial fibrillation: Secondary | ICD-10-CM | POA: Diagnosis not present

## 2022-08-01 DIAGNOSIS — H18421 Band keratopathy, right eye: Secondary | ICD-10-CM | POA: Diagnosis not present

## 2022-08-01 DIAGNOSIS — H16401 Unspecified corneal neovascularization, right eye: Secondary | ICD-10-CM | POA: Diagnosis not present

## 2022-08-01 DIAGNOSIS — H3122 Choroidal dystrophy (central areolar) (generalized) (peripapillary): Secondary | ICD-10-CM | POA: Diagnosis not present

## 2022-08-01 DIAGNOSIS — H4421 Degenerative myopia, right eye: Secondary | ICD-10-CM | POA: Diagnosis not present

## 2022-08-01 DIAGNOSIS — H18001 Unspecified corneal deposit, right eye: Secondary | ICD-10-CM | POA: Diagnosis not present

## 2022-08-01 DIAGNOSIS — H15831 Staphyloma posticum, right eye: Secondary | ICD-10-CM | POA: Diagnosis not present

## 2022-08-01 DIAGNOSIS — H35341 Macular cyst, hole, or pseudohole, right eye: Secondary | ICD-10-CM | POA: Diagnosis not present

## 2022-08-01 DIAGNOSIS — H1789 Other corneal scars and opacities: Secondary | ICD-10-CM | POA: Diagnosis not present

## 2022-08-01 DIAGNOSIS — H04123 Dry eye syndrome of bilateral lacrimal glands: Secondary | ICD-10-CM | POA: Diagnosis not present

## 2022-08-03 DIAGNOSIS — M1612 Unilateral primary osteoarthritis, left hip: Secondary | ICD-10-CM | POA: Diagnosis not present

## 2022-08-03 DIAGNOSIS — D509 Iron deficiency anemia, unspecified: Secondary | ICD-10-CM | POA: Diagnosis not present

## 2022-08-03 DIAGNOSIS — I341 Nonrheumatic mitral (valve) prolapse: Secondary | ICD-10-CM | POA: Diagnosis not present

## 2022-08-03 DIAGNOSIS — I471 Supraventricular tachycardia, unspecified: Secondary | ICD-10-CM | POA: Diagnosis not present

## 2022-08-03 DIAGNOSIS — H409 Unspecified glaucoma: Secondary | ICD-10-CM | POA: Diagnosis not present

## 2022-08-03 DIAGNOSIS — I4819 Other persistent atrial fibrillation: Secondary | ICD-10-CM | POA: Diagnosis not present

## 2022-08-03 DIAGNOSIS — I10 Essential (primary) hypertension: Secondary | ICD-10-CM | POA: Diagnosis not present

## 2022-08-03 DIAGNOSIS — R1314 Dysphagia, pharyngoesophageal phase: Secondary | ICD-10-CM | POA: Diagnosis not present

## 2022-08-03 DIAGNOSIS — H547 Unspecified visual loss: Secondary | ICD-10-CM | POA: Diagnosis not present

## 2022-08-06 DIAGNOSIS — H18421 Band keratopathy, right eye: Secondary | ICD-10-CM | POA: Diagnosis not present

## 2022-08-09 DIAGNOSIS — I341 Nonrheumatic mitral (valve) prolapse: Secondary | ICD-10-CM | POA: Diagnosis not present

## 2022-08-09 DIAGNOSIS — I471 Supraventricular tachycardia, unspecified: Secondary | ICD-10-CM | POA: Diagnosis not present

## 2022-08-09 DIAGNOSIS — H547 Unspecified visual loss: Secondary | ICD-10-CM | POA: Diagnosis not present

## 2022-08-09 DIAGNOSIS — M1612 Unilateral primary osteoarthritis, left hip: Secondary | ICD-10-CM | POA: Diagnosis not present

## 2022-08-09 DIAGNOSIS — D509 Iron deficiency anemia, unspecified: Secondary | ICD-10-CM | POA: Diagnosis not present

## 2022-08-09 DIAGNOSIS — R1314 Dysphagia, pharyngoesophageal phase: Secondary | ICD-10-CM | POA: Diagnosis not present

## 2022-08-09 DIAGNOSIS — H409 Unspecified glaucoma: Secondary | ICD-10-CM | POA: Diagnosis not present

## 2022-08-09 DIAGNOSIS — I10 Essential (primary) hypertension: Secondary | ICD-10-CM | POA: Diagnosis not present

## 2022-08-09 DIAGNOSIS — I4819 Other persistent atrial fibrillation: Secondary | ICD-10-CM | POA: Diagnosis not present

## 2022-08-10 DIAGNOSIS — Z970 Presence of artificial eye: Secondary | ICD-10-CM | POA: Diagnosis not present

## 2022-08-10 DIAGNOSIS — H18891 Other specified disorders of cornea, right eye: Secondary | ICD-10-CM | POA: Diagnosis not present

## 2022-08-10 DIAGNOSIS — H401114 Primary open-angle glaucoma, right eye, indeterminate stage: Secondary | ICD-10-CM | POA: Diagnosis not present

## 2022-08-10 DIAGNOSIS — H18421 Band keratopathy, right eye: Secondary | ICD-10-CM | POA: Diagnosis not present

## 2022-08-10 DIAGNOSIS — H04121 Dry eye syndrome of right lacrimal gland: Secondary | ICD-10-CM | POA: Diagnosis not present

## 2022-08-10 DIAGNOSIS — H16231 Neurotrophic keratoconjunctivitis, right eye: Secondary | ICD-10-CM | POA: Diagnosis not present

## 2022-08-15 DIAGNOSIS — H409 Unspecified glaucoma: Secondary | ICD-10-CM | POA: Diagnosis not present

## 2022-08-15 DIAGNOSIS — I471 Supraventricular tachycardia, unspecified: Secondary | ICD-10-CM | POA: Diagnosis not present

## 2022-08-15 DIAGNOSIS — R1314 Dysphagia, pharyngoesophageal phase: Secondary | ICD-10-CM | POA: Diagnosis not present

## 2022-08-15 DIAGNOSIS — H547 Unspecified visual loss: Secondary | ICD-10-CM | POA: Diagnosis not present

## 2022-08-15 DIAGNOSIS — I10 Essential (primary) hypertension: Secondary | ICD-10-CM | POA: Diagnosis not present

## 2022-08-15 DIAGNOSIS — M1612 Unilateral primary osteoarthritis, left hip: Secondary | ICD-10-CM | POA: Diagnosis not present

## 2022-08-15 DIAGNOSIS — I4819 Other persistent atrial fibrillation: Secondary | ICD-10-CM | POA: Diagnosis not present

## 2022-08-15 DIAGNOSIS — I341 Nonrheumatic mitral (valve) prolapse: Secondary | ICD-10-CM | POA: Diagnosis not present

## 2022-08-15 DIAGNOSIS — D509 Iron deficiency anemia, unspecified: Secondary | ICD-10-CM | POA: Diagnosis not present

## 2022-08-22 DIAGNOSIS — R1314 Dysphagia, pharyngoesophageal phase: Secondary | ICD-10-CM | POA: Diagnosis not present

## 2022-08-22 DIAGNOSIS — H409 Unspecified glaucoma: Secondary | ICD-10-CM | POA: Diagnosis not present

## 2022-08-22 DIAGNOSIS — I4819 Other persistent atrial fibrillation: Secondary | ICD-10-CM | POA: Diagnosis not present

## 2022-08-22 DIAGNOSIS — I341 Nonrheumatic mitral (valve) prolapse: Secondary | ICD-10-CM | POA: Diagnosis not present

## 2022-08-22 DIAGNOSIS — I471 Supraventricular tachycardia, unspecified: Secondary | ICD-10-CM | POA: Diagnosis not present

## 2022-08-22 DIAGNOSIS — H547 Unspecified visual loss: Secondary | ICD-10-CM | POA: Diagnosis not present

## 2022-08-22 DIAGNOSIS — M1612 Unilateral primary osteoarthritis, left hip: Secondary | ICD-10-CM | POA: Diagnosis not present

## 2022-08-22 DIAGNOSIS — I10 Essential (primary) hypertension: Secondary | ICD-10-CM | POA: Diagnosis not present

## 2022-08-22 DIAGNOSIS — D509 Iron deficiency anemia, unspecified: Secondary | ICD-10-CM | POA: Diagnosis not present

## 2022-08-31 DIAGNOSIS — I341 Nonrheumatic mitral (valve) prolapse: Secondary | ICD-10-CM | POA: Diagnosis not present

## 2022-08-31 DIAGNOSIS — D509 Iron deficiency anemia, unspecified: Secondary | ICD-10-CM | POA: Diagnosis not present

## 2022-08-31 DIAGNOSIS — I471 Supraventricular tachycardia, unspecified: Secondary | ICD-10-CM | POA: Diagnosis not present

## 2022-08-31 DIAGNOSIS — R1314 Dysphagia, pharyngoesophageal phase: Secondary | ICD-10-CM | POA: Diagnosis not present

## 2022-08-31 DIAGNOSIS — H547 Unspecified visual loss: Secondary | ICD-10-CM | POA: Diagnosis not present

## 2022-08-31 DIAGNOSIS — H409 Unspecified glaucoma: Secondary | ICD-10-CM | POA: Diagnosis not present

## 2022-08-31 DIAGNOSIS — M1612 Unilateral primary osteoarthritis, left hip: Secondary | ICD-10-CM | POA: Diagnosis not present

## 2022-08-31 DIAGNOSIS — I4819 Other persistent atrial fibrillation: Secondary | ICD-10-CM | POA: Diagnosis not present

## 2022-08-31 DIAGNOSIS — I10 Essential (primary) hypertension: Secondary | ICD-10-CM | POA: Diagnosis not present

## 2022-09-13 DIAGNOSIS — R1314 Dysphagia, pharyngoesophageal phase: Secondary | ICD-10-CM | POA: Diagnosis not present

## 2022-09-13 DIAGNOSIS — M1612 Unilateral primary osteoarthritis, left hip: Secondary | ICD-10-CM | POA: Diagnosis not present

## 2022-09-13 DIAGNOSIS — I471 Supraventricular tachycardia, unspecified: Secondary | ICD-10-CM | POA: Diagnosis not present

## 2022-09-13 DIAGNOSIS — I10 Essential (primary) hypertension: Secondary | ICD-10-CM | POA: Diagnosis not present

## 2022-09-13 DIAGNOSIS — I341 Nonrheumatic mitral (valve) prolapse: Secondary | ICD-10-CM | POA: Diagnosis not present

## 2022-09-13 DIAGNOSIS — H547 Unspecified visual loss: Secondary | ICD-10-CM | POA: Diagnosis not present

## 2022-09-13 DIAGNOSIS — I4819 Other persistent atrial fibrillation: Secondary | ICD-10-CM | POA: Diagnosis not present

## 2022-09-13 DIAGNOSIS — D509 Iron deficiency anemia, unspecified: Secondary | ICD-10-CM | POA: Diagnosis not present

## 2022-09-13 DIAGNOSIS — H409 Unspecified glaucoma: Secondary | ICD-10-CM | POA: Diagnosis not present

## 2022-09-19 ENCOUNTER — Other Ambulatory Visit: Payer: Self-pay | Admitting: Cardiology

## 2022-09-19 DIAGNOSIS — I4891 Unspecified atrial fibrillation: Secondary | ICD-10-CM

## 2022-09-19 NOTE — Telephone Encounter (Signed)
Prescription refill request for Eliquis received. Indication: Afib  Last office visit: 04/26/22 ([place])  Scr: 0.95 (10/23/21)  Age: 89 Weight: 70.6kg  Appropriate dose. Refill sent.

## 2022-10-10 DIAGNOSIS — L820 Inflamed seborrheic keratosis: Secondary | ICD-10-CM | POA: Diagnosis not present

## 2022-10-23 DIAGNOSIS — H401114 Primary open-angle glaucoma, right eye, indeterminate stage: Secondary | ICD-10-CM | POA: Diagnosis not present

## 2022-10-23 DIAGNOSIS — Z970 Presence of artificial eye: Secondary | ICD-10-CM | POA: Diagnosis not present

## 2022-10-23 DIAGNOSIS — H18891 Other specified disorders of cornea, right eye: Secondary | ICD-10-CM | POA: Diagnosis not present

## 2022-10-23 DIAGNOSIS — H04123 Dry eye syndrome of bilateral lacrimal glands: Secondary | ICD-10-CM | POA: Diagnosis not present

## 2022-10-23 DIAGNOSIS — H16231 Neurotrophic keratoconjunctivitis, right eye: Secondary | ICD-10-CM | POA: Diagnosis not present

## 2022-10-23 DIAGNOSIS — Z9889 Other specified postprocedural states: Secondary | ICD-10-CM | POA: Diagnosis not present

## 2022-10-23 DIAGNOSIS — H04121 Dry eye syndrome of right lacrimal gland: Secondary | ICD-10-CM | POA: Diagnosis not present

## 2022-10-30 DIAGNOSIS — M1612 Unilateral primary osteoarthritis, left hip: Secondary | ICD-10-CM | POA: Diagnosis not present

## 2022-11-06 ENCOUNTER — Telehealth: Payer: Self-pay | Admitting: *Deleted

## 2022-11-06 DIAGNOSIS — S92325D Nondisplaced fracture of second metatarsal bone, left foot, subsequent encounter for fracture with routine healing: Secondary | ICD-10-CM | POA: Diagnosis not present

## 2022-11-06 NOTE — Telephone Encounter (Signed)
Lvm for pt to call office to schedule in person visit for pre op.If pt calls please schedule.

## 2022-11-06 NOTE — Telephone Encounter (Signed)
   Name: Regina Taylor  DOB: [birth year]  MRN: [id number]  Primary Cardiologist: Peter [place], MD  Chart reviewed as part of pre-operative protocol coverage. Because of Regina Taylor's past medical history and time since last visit, she will require a follow-up in-office visit in order to better assess preoperative cardiovascular risk.  Pre-op covering staff: - Please schedule appointment and call patient to inform them. If patient already had an upcoming appointment within acceptable timeframe, please add "pre-op clearance" to the appointment notes so provider is aware. - Please contact requesting surgeon's office via preferred method (i.e, phone, fax) to inform them of need for appointment prior to surgery.  This message will also be routed to pharmacy pool for input on holding Eliquis as requested below so that this information is available to the clearing provider at time of patient's appointment.   Joylene Grapes, NP  11/06/2022, 11:58 AM

## 2022-11-06 NOTE — Telephone Encounter (Signed)
Patient with diagnosis of afib on Eliquis for anticoagulation.    Procedure: left total hip arthroplasty Date of procedure: TBD  CHA2DS2-VASc Score = 6  This indicates a 9.7% annual risk of stroke. The patient's score is based upon: CHF History: 1 HTN History: 1 Diabetes History: 0 Stroke History: 0 Vascular Disease History: 1 Age Score: 2 Gender Score: 1   CrCl 57mL/min Platelet count 251K  Per office protocol, patient can hold Eliquis for 3 days prior to procedure.    **This guidance is not considered finalized until pre-operative APP has relayed final recommendations.**

## 2022-11-06 NOTE — Telephone Encounter (Signed)
   Pre-operative Risk Assessment    Patient Name: Regina Taylor  DOB: 16-Mar-1928 MRN: [id number]      Request for Surgical Clearance    Procedure:   LEFT TOTAL HIP ARTHROPLASTY  Date of Surgery:  Clearance TBD                                 Surgeon:  DR. DANIEL MARCHWIANY Surgeon's Group or Practice Name:  [facility] Phone number:  [phone] EXT [phone] ATTN: KELLY HIGH Fax number:  [phone]   Type of Clearance Requested:   - Medical  - Pharmacy:  Hold Apixaban (Eliquis)     Type of Anesthesia:  Spinal   Additional requests/questions:    Elpidio Anis   11/06/2022, 11:26 AM

## 2022-11-07 NOTE — Telephone Encounter (Signed)
Spoke to patient stated she needs left hip replaced due to severe arthritis.Stated hard to sleep at night due to pain.Advised Dr.Jordan's schedule is full.She refused to see a extender.I will speak to Dr.Jordan when he is back in office next week.

## 2022-11-07 NOTE — Telephone Encounter (Signed)
Pt did call to schedule an appointment.  She only wants to see Dr. [place] due to her concerns and questions she has regarding surgery and the 1st available wasn't until October.  Pt is on a cancellation list, but also asked that I send his RN a message to see if she can work her in sooner.    Will route this to Anabel Halon, RN and back to the requesting surgeon's office to make them aware.

## 2022-11-16 NOTE — Telephone Encounter (Signed)
Spoke to patient's son Dr.Jordan advised patient does not need to be seen before her upcoming hip surgery.Ok to hold Eliquis 3 days prior.Son stated he would like his mother seen.They haven't decided if she will have surgery.Appointment scheduled with Bernadene Person NP 7/10 at 2:45 pm.

## 2022-11-28 ENCOUNTER — Encounter: Payer: Self-pay | Admitting: Nurse Practitioner

## 2022-11-28 ENCOUNTER — Other Ambulatory Visit: Payer: Self-pay

## 2022-11-28 ENCOUNTER — Ambulatory Visit: Payer: Medicare PPO | Attending: Nurse Practitioner | Admitting: Nurse Practitioner

## 2022-11-28 VITALS — BP 142/84 | HR 59 | Ht 65.0 in | Wt 157.5 lb

## 2022-11-28 DIAGNOSIS — R0609 Other forms of dyspnea: Secondary | ICD-10-CM | POA: Diagnosis not present

## 2022-11-28 DIAGNOSIS — I471 Supraventricular tachycardia, unspecified: Secondary | ICD-10-CM

## 2022-11-28 DIAGNOSIS — R072 Precordial pain: Secondary | ICD-10-CM

## 2022-11-28 DIAGNOSIS — I5032 Chronic diastolic (congestive) heart failure: Secondary | ICD-10-CM

## 2022-11-28 DIAGNOSIS — Z01810 Encounter for preprocedural cardiovascular examination: Secondary | ICD-10-CM

## 2022-11-28 DIAGNOSIS — I480 Paroxysmal atrial fibrillation: Secondary | ICD-10-CM | POA: Diagnosis not present

## 2022-11-28 MED ORDER — FUROSEMIDE 40 MG PO TABS: ORAL_TABLET | ORAL | 3 refills | Status: DC

## 2022-11-28 NOTE — Progress Notes (Signed)
Office Visit    Patient Name: Regina Taylor Date of Encounter: 11/28/2022  Primary Care Provider:  Merri Brunette, MD Primary Cardiologist:  Peter [place], MD  Chief Complaint    89 year old female with a history of paroxysmal atrial fibrillation, PSVT, chronic diastolic heart failure, macular degeneration, glaucoma, and GERD who presents for follow-up related to atrial fibrillation and for preoperative cardiac evaluation.  Past Medical History    Past Medical History:  Diagnosis Date   Arthritis    Blindness of left eye    Fatigue    GERD (gastroesophageal reflux disease)    Glaucoma    Macular degeneration    MVP (mitral valve prolapse)    Palpitations    SVT (supraventricular tachycardia)    Past Surgical History:  Procedure Laterality Date   CARDIOVERSION N/A 08/25/2020   Procedure: CARDIOVERSION;  Surgeon: Little Ishikawa, MD;  Location: [facility] END[facility]COPY;  Service: Cardiovascular;  Laterality: N/A;   CATARAC     CATARACT EXTRACTION     PARTIAL HIP ARTHROPLASTY     RETINAL DETACHMENT SURGERY     TOTAL HIP ARTHROPLASTY Right 1990    Allergies  Allergies  Allergen Reactions   Shellfish-Derived Products Anaphylaxis   Iodinated Contrast Media Nausea And Vomiting   Latanoprost Other (See Comments)    Vision problems   Xalatan     Vision problems     Labs/Other Studies Reviewed    The following studies were reviewed today:  Cardiac Studies & Procedures       ECHOCARDIOGRAM  ECHOCARDIOGRAM COMPLETE 03/18/2020  Narrative ECHOCARDIOGRAM REPORT    Patient Name:   Regina Taylor Date of Exam: 03/18/2020 Medical Rec #:  [id number]       Height:       65.0 in Accession #:    [id number]      Weight:       171.9 lb Date of Birth:  1928/05/12        BSA:          1.855 m Patient Age:    89 years        BP:           116/64 mmHg Patient Gender: F               HR:           100 bpm. Exam Location:  Inpatient  Procedure: 2D Echo, Cardiac Doppler and  Color Doppler  Indications:     Congestive Heart Failure  History:         Patient has prior history of Echocardiogram examinations, most recent 01/26/2014. Arrythmias:Atrial Fibrillation; Risk Factors:Former Smoker. MVP. DOE.  Sonographer:     Ross Ludwig RDCS (AE) Referring Phys:  [id number] Gertha Calkin Diagnosing Phys: Lennie Odor MD  IMPRESSIONS   1. Left ventricular ejection fraction, by estimation, is 60 to 65%. The left ventricle has normal function. The left ventricle has no regional wall motion abnormalities. There is moderate concentric left ventricular hypertrophy. Left ventricular diastolic function could not be evaluated. 2. Right ventricular systolic function is normal. The right ventricular size is normal. There is moderately elevated pulmonary artery systolic pressure. The estimated right ventricular systolic pressure is 45.2 mmHg. 3. Left atrial size was severely dilated. 4. Right atrial size was mildly dilated. 5. The mitral valve is degenerative. Trivial mitral valve regurgitation. No evidence of mitral stenosis. 6. The aortic valve is tricuspid. There is mild calcification of the aortic valve. There is mild  thickening of the aortic valve. Aortic valve regurgitation is mild. Mild aortic valve sclerosis is present, with no evidence of aortic valve stenosis. 7. The inferior vena cava is dilated in size with <50% respiratory variability, suggesting right atrial pressure of 15 mmHg.  Comparison(s): Changes from prior study are noted. In Afib with RVR. EF remains the same. RVSP ~45 mmHG on this study.  FINDINGS Left Ventricle: Left ventricular ejection fraction, by estimation, is 60 to 65%. The left ventricle has normal function. The left ventricle has no regional wall motion abnormalities. The left ventricular internal cavity size was normal in size. There is moderate concentric left ventricular hypertrophy. Left ventricular diastolic function could not be evaluated due to  atrial fibrillation. Left ventricular diastolic function could not be evaluated.  Right Ventricle: The right ventricular size is normal. No increase in right ventricular wall thickness. Right ventricular systolic function is normal. There is moderately elevated pulmonary artery systolic pressure. The tricuspid regurgitant velocity is 2.75 m/s, and with an assumed right atrial pressure of 15 mmHg, the estimated right ventricular systolic pressure is 45.2 mmHg.  Left Atrium: Left atrial size was severely dilated.  Right Atrium: Right atrial size was mildly dilated.  Pericardium: Trivial pericardial effusion is present. Presence of pericardial fat pad.  Mitral Valve: The mitral valve is degenerative in appearance. Mild to moderate mitral annular calcification. Trivial mitral valve regurgitation. No evidence of mitral valve stenosis.  Tricuspid Valve: The tricuspid valve is grossly normal. Tricuspid valve regurgitation is mild . No evidence of tricuspid stenosis.  Aortic Valve: The aortic valve is tricuspid. There is mild calcification of the aortic valve. There is mild thickening of the aortic valve. Aortic valve regurgitation is mild. Aortic regurgitation PHT measures 716 msec. Mild aortic valve sclerosis is present, with no evidence of aortic valve stenosis. Aortic valve mean gradient measures 6.0 mmHg. Aortic valve peak gradient measures 10.1 mmHg. Aortic valve area, by VTI measures 1.64 cm.  Pulmonic Valve: The pulmonic valve was grossly normal. Pulmonic valve regurgitation is not visualized. No evidence of pulmonic stenosis.  Aorta: The aortic root is normal in size and structure.  Venous: The inferior vena cava is dilated in size with less than 50% respiratory variability, suggesting right atrial pressure of 15 mmHg.  IAS/Shunts: The atrial septum is grossly normal.  Additional Comments: There is a small pleural effusion in the left lateral region.   LEFT VENTRICLE PLAX 2D LVIDd:          3.80 cm LVIDs:         2.40 cm LV PW:         1.48 cm LV IVS:        1.50 cm LVOT diam:     1.90 cm LV SV:         55 LV SV Index:   30 LVOT Area:     2.84 cm   RIGHT VENTRICLE            IVC RV Basal diam:  3.20 cm    IVC diam: 2.40 cm RV S prime:     9.45 cm/s TAPSE (M-mode): 2.3 cm  LEFT ATRIUM              Index       RIGHT ATRIUM           Index LA diam:        4.00 cm  2.16 cm/m  RA Area:     21.30 cm LA Vol (A2C):  103.0 ml 55.53 ml/m RA Volume:   62.00 ml  33.43 ml/m LA Vol (A4C):   96.3 ml  51.92 ml/m LA Biplane Vol: 100.0 ml 53.91 ml/m AORTIC VALVE AV Area (Vmax):    2.00 cm AV Area (Vmean):   1.70 cm AV Area (VTI):     1.64 cm AV Vmax:           159.00 cm/s AV Vmean:          116.000 cm/s AV VTI:            0.334 m AV Peak Grad:      10.1 mmHg AV Mean Grad:      6.0 mmHg LVOT Vmax:         112.00 cm/s LVOT Vmean:        69.500 cm/s LVOT VTI:          0.193 m LVOT/AV VTI ratio: 0.58 AI PHT:            716 msec  AORTA Ao Root diam: 3.20 cm  TRICUSPID VALVE TR Peak grad:   30.2 mmHg TR Vmax:        275.00 cm/s  SHUNTS Systemic VTI:  0.19 m Systemic Diam: 1.90 cm  Lennie Odor MD Electronically signed by Lennie Odor MD Signature Date/Time: 03/18/2020/2:39:14 PM    Final (Updated)            Recent Labs: 04/09/2022: Hemoglobin 10.7; Platelets 251  Recent Lipid Panel    Component Value Date/Time   CHOL 147 01/25/2014 2340   TRIG 63 01/25/2014 2340   HDL 47 01/25/2014 2340   CHOLHDL 3.1 01/25/2014 2340   VLDL 13 01/25/2014 2340   LDLCALC 87 01/25/2014 2340    History of Present Illness    89 year old female with the above past medical history including paroxysmal atrial fibrillation, PSVT, chronic diastolic heart failure, macular degeneration, glaucoma, and GERD.   She was hospitalized in October 2021 in the setting of atrial fibrillation with RVR.  Echocardiogram the time showed EF 65%, moderate LVH, mild MR, AR, mild  aortic valve sclerosis, mild pulmonary hypertension, severe LAE.  She was started on Eliquis, diltiazem and metoprolol.  She underwent elective DCCV in 08/2020.  She was started on amiodarone. She has a history of nosebleeds on Eliquis but has remained on anticoagulation therapy.  She was last seen in office on 04/26/2022 and was stable from a cardiac standpoint.  She denied any significant bleeding, denied palpitations.  She presents today for follow-up accompanied by her son and for preoperative cardiac evaluation for upcoming left total hip arthroplasty with Dr. Weber Cooks of [facility] with request to hold Eliquis prior to procedure.  Since her last visit she has been stable from a cardiac standpoint. She states she is uncertain as to whether or not she will proceed with surgery.  She received a cortisone injection with some improvement in her hip pain.  She notes intermittent chest "hurting" which last for seconds to minutes at a time.  She also notes mild dyspnea on exertion.  She has increased bilateral lower extremity edema.  She denies PND, orthopnea, weight gain.    Home Medications    Current Outpatient Medications  Medication Sig Dispense Refill   amiodarone (PACERONE) 200 MG tablet Take 1/2 tablet ( 100 mg ) daily for 6 days, hold on Sunday. 90 tablet 3   BION TEARS PF 0.1-0.3 % SOLN SMARTSIG:1 Drop(s) Right Eye PRN     diltiazem (CARDIZEM  CD) 240 MG 24 hr capsule TAKE ONE CAPSULE BY MOUTH DAILY 90 capsule 3   ELIQUIS 5 MG TABS tablet TAKE ONE TABLET BY MOUTH TWICE DAILY 180 tablet 1   furosemide (LASIX) 40 MG tablet Take 1 tablet daily for 3 days, then take 40 mg daily as needed only for weight gain of 3 lb over night or 5 lb in 1 week. 90 tablet 3   metoprolol succinate (TOPROL-XL) 50 MG 24 hr tablet Take 1 tablet (50 mg total) by mouth daily. 180 tablet 1   Multiple Vitamins-Minerals (ICAPS PO) Take 1 capsule by mouth daily.     olmesartan (BENICAR) 40 MG tablet  Take 40 mg by mouth daily.     COSOPT PF 2-0.5 % SOLN ophthalmic solution Place 1 drop into the right eye 2 (two) times daily. (Patient not taking: Reported on 11/28/2022)     Ferrous Sulfate (IRON) 325 (65 Fe) MG TABS 1 tablet Orally once a day     hypromellose (GENTEAL) 0.3 % GEL ophthalmic ointment Place 1 drop into the right eye at bedtime. (Patient not taking: Reported on 11/28/2022)     Red Yeast Rice Extract (RED YEAST RICE PO) Take 1 tablet by mouth daily. (Patient not taking: Reported on 11/28/2022)     REFRESH CELLUVISC 1 % GEL Place 1 drop into the right eye at bedtime. (Patient not taking: Reported on 11/28/2022)     Serum Base LIQD 1 drop.     No current facility-administered medications for this visit.     Review of Systems    She denies palpitations, pnd, orthopnea, n, v, dizziness, syncope, weight gain, or early satiety. All other systems reviewed and are otherwise negative except as noted above.   Physical Exam    VS:  BP (!) 142/84   Pulse (!) 59   Ht 5\' 5"  (1.651 m)   Wt 157 lb 8 oz (71.4 kg)   SpO2 96%   BMI 26.21 kg/m   GEN: Well nourished, well developed, in no acute distress. HEENT: normal. Neck: Supple, no JVD, carotid bruits, or masses. Cardiac: RRR, no murmurs, rubs, or gallops. No clubbing, cyanosis, non pitting bilateral lower extremity edema.  Radials/DP/PT 2+ and equal bilaterally.  Respiratory:  Respirations regular and unlabored, clear to auscultation bilaterally. GI: Soft, nontender, nondistended, BS + x 4. MS: no deformity or atrophy. Skin: warm and dry, no rash. Neuro:  Strength and sensation are intact. Psych: Normal affect.  Accessory Clinical Findings    ECG personally reviewed by me today - EKG Interpretation Date/Time:  Wednesday November 28 2022 14:52:32 EDT Ventricular Rate:  59 PR Interval:  184 QRS Duration:  94 QT Interval:  430 QTC Calculation: 425 R Axis:   -16  Text Interpretation: Sinus bradycardia Minimal voltage criteria for  LVH, may be normal variant ( Cornell product ) When compared with ECG of 25-Aug-2020 11:34, PR interval has decreased Confirmed by Bernadene Person ([id number]) on 11/28/2022 3:35:47 PM  - no acute changes.   Lab Results  Component Value Date   WBC 7.0 04/09/2022   HGB 10.7 (L) 04/09/2022   HCT 32.7 (L) 04/09/2022   MCV 93.7 04/09/2022   PLT 251 04/09/2022   Lab Results  Component Value Date   CREATININE 0.95 10/23/2021   BUN 23 10/23/2021   NA 135 10/23/2021   K 5.1 10/23/2021   CL 100 10/23/2021   CO2 23 10/23/2021   Lab Results  Component Value Date   ALT 17 10/23/2021  AST 18 10/23/2021   ALKPHOS 82 10/23/2021   BILITOT 0.2 10/23/2021   Lab Results  Component Value Date   CHOL 147 01/25/2014   HDL 47 01/25/2014   LDLCALC 87 01/25/2014   TRIG 63 01/25/2014   CHOLHDL 3.1 01/25/2014    No results found for: "HGBA1C"  Assessment & Plan    1. Chest pain/dyspnea on exertion: She notes a several month history of intermittent  hurting" in her chest that lasts for seconds to minutes at a time.  She also notes mild dyspnea on exertion, increased bilateral lower extremity edema.  We discussed possible ischemic evaluation with stress test given need for surgical clearance, new symptoms of chest discomfort, however, she declines at this time.  She is agreeable to repeat echo.  Will add Lasix as below.  Reviewed ED precautions.  2. Chronic diastolic heart failure: Echo in 2021 showed EF 65%, moderate LVH, mild MR, AR, mild aortic valve sclerosis, mild pulmonary hypertension, severe LAE. She notes mild dyspnea on exertion, increased bilateral lower extremity edema. She denies PND, orthopnea, weight gain.  Will add Lasix 40 mg daily x 3 days followed by Lasix 40 mg daily as needed for swelling, weight gain.  Will check BMET in 1 week.  Continue metoprolol, olmesartan.  3. Paroxysmal atrial fibrillation/PSVT: Maintaining sinus rhythm.  Denies any recent palpitations.  Continue amiodarone,  metoprolol, diltiazem, Eliquis.  4. Preoperative cardiac exam: According to the Revised Cardiac Risk Index (RCRI), her Perioperative Risk of Major Cardiac Event is (%): 0.9. Her Functional Capacity in METs is: 3.63 according to the Duke Activity Status Index (DASI).  She is unable to complete greater than 4 METS at baseline.  Additionally, she notes new symptoms of chest discomfort and dyspnea on exertion.  Repeat echo pending as above.  She is uncertain whether or not she will proceed with hip surgery.  We discussed that it if she decides to proceed with surgery she will likely require ischemic evaluation (i.e. stress test).  She verbalized understanding.  She will notify us if she decides to proceed with surgery. If cleared at future date, per office protocol, patient can hold Eliquis for 3 days prior to procedure, TBD.  5. Disposition: For now, follow-up as scheduled with Dr. [place] in 02/2023, sooner if needed.   Joylene Grapes, NP 11/28/2022, 8:13 PM

## 2022-11-28 NOTE — Patient Instructions (Addendum)
Medication Instructions:  Furosemide (Lasix) 40 mg daily for 3 days, then 40 mg daily as needed only for weight gain of 3 lb in 1 week or 5 lb in 1 week or shortness of breath.   *If you need a refill on your cardiac medications before your next appointment, please call your pharmacy*   Lab Work: BMET in 1 week    Testing/Procedures: Your physician has requested that you have an echocardiogram. Echocardiography is a painless test that uses sound waves to create images of your heart. It provides your doctor with information about the size and shape of your heart and how well your heart's chambers and valves are working. This procedure takes approximately one hour. There are no restrictions for this procedure. Please do NOT wear cologne, perfume, aftershave, or lotions (deodorant is allowed). Please arrive 15 minutes prior to your appointment time.    Follow-Up: At [facility], you and your health needs are our priority.  As part of our continuing mission to provide you with exceptional heart care, we have created designated Provider Care Teams.  These Care Teams include your primary Cardiologist (physician) and Advanced Practice Providers (APPs -  Physician Assistants and Nurse Practitioners) who all work together to provide you with the care you need, when you need it.  We recommend signing up for the patient portal called "MyChart".  Sign up information is provided on this After Visit Summary.  MyChart is used to connect with patients for Virtual Visits (Telemedicine).  Patients are able to view lab/test results, encounter notes, upcoming appointments, etc.  Non-urgent messages can be sent to your provider as well.   To learn more about what you can do with MyChart, go to [email or web address].    Your next appointment:    Keep Follow up   Provider:   Peter [place], MD     Other Instructions  Heart Failure Education:  Weigh yourself EVERY morning after you go to the  bathroom but before you eat or drink anything. Write this number down in a weight log/diary. If you gain 3 pounds overnight or 5 pounds in a week, call the office. Take your medicines as prescribed. If you have concerns about your medications, please call us before you stop taking them. Eat low salt foods--Limit salt (sodium) to 2000 mg per day. This will help prevent your body from holding onto fluid. Read food labels as many processed foods have a lot of sodium, especially canned goods and prepackaged meats. If you would like some assistance choosing low sodium foods, we would be happy to set you up with a nutritionist. Limit all fluids for the day to less than 2 liters (64 ounces). Fluid includes all drinks, coffee, juice, ice chips, soup, jello, and all other liquids. Stay as active as you can everyday. Staying active will give you more energy and make your muscles stronger. Start with 5 minutes at a time and work your way up to 30 minutes a day. Break up your activities--do some in the morning and some in the afternoon. Start with 3 days per week and work your way up to 5 days as you can.  If you have chest pain, feel short of breath, dizzy, or lightheaded, STOP. If you don't feel better after a short rest, call 911. If you do feel better, call the office to let us know you have symptoms with exercise.

## 2022-12-03 DIAGNOSIS — S92325D Nondisplaced fracture of second metatarsal bone, left foot, subsequent encounter for fracture with routine healing: Secondary | ICD-10-CM | POA: Diagnosis not present

## 2022-12-03 DIAGNOSIS — S52501A Unspecified fracture of the lower end of right radius, initial encounter for closed fracture: Secondary | ICD-10-CM | POA: Diagnosis not present

## 2022-12-17 DIAGNOSIS — I480 Paroxysmal atrial fibrillation: Secondary | ICD-10-CM | POA: Diagnosis not present

## 2022-12-17 DIAGNOSIS — S52501D Unspecified fracture of the lower end of right radius, subsequent encounter for closed fracture with routine healing: Secondary | ICD-10-CM | POA: Diagnosis not present

## 2022-12-17 DIAGNOSIS — I471 Supraventricular tachycardia, unspecified: Secondary | ICD-10-CM | POA: Diagnosis not present

## 2022-12-17 DIAGNOSIS — I5032 Chronic diastolic (congestive) heart failure: Secondary | ICD-10-CM | POA: Diagnosis not present

## 2022-12-17 DIAGNOSIS — Z01810 Encounter for preprocedural cardiovascular examination: Secondary | ICD-10-CM | POA: Diagnosis not present

## 2022-12-20 ENCOUNTER — Ambulatory Visit (HOSPITAL_COMMUNITY): Payer: Medicare PPO | Attending: Nurse Practitioner

## 2022-12-20 DIAGNOSIS — I480 Paroxysmal atrial fibrillation: Secondary | ICD-10-CM | POA: Diagnosis not present

## 2022-12-20 DIAGNOSIS — I471 Supraventricular tachycardia, unspecified: Secondary | ICD-10-CM | POA: Insufficient documentation

## 2022-12-20 LAB — ECHOCARDIOGRAM COMPLETE
Area-P 1/2: 4.71 cm2
P 1/2 time: 468 msec
S' Lateral: 2.3 cm

## 2022-12-24 DIAGNOSIS — S81811A Laceration without foreign body, right lower leg, initial encounter: Secondary | ICD-10-CM | POA: Diagnosis not present

## 2022-12-24 DIAGNOSIS — L03115 Cellulitis of right lower limb: Secondary | ICD-10-CM | POA: Diagnosis not present

## 2022-12-26 ENCOUNTER — Other Ambulatory Visit: Payer: Self-pay | Admitting: Cardiology

## 2022-12-27 DIAGNOSIS — H04123 Dry eye syndrome of bilateral lacrimal glands: Secondary | ICD-10-CM | POA: Diagnosis not present

## 2022-12-27 DIAGNOSIS — H401114 Primary open-angle glaucoma, right eye, indeterminate stage: Secondary | ICD-10-CM | POA: Diagnosis not present

## 2023-01-01 DIAGNOSIS — S52501D Unspecified fracture of the lower end of right radius, subsequent encounter for closed fracture with routine healing: Secondary | ICD-10-CM | POA: Diagnosis not present

## 2023-01-15 DIAGNOSIS — S52501D Unspecified fracture of the lower end of right radius, subsequent encounter for closed fracture with routine healing: Secondary | ICD-10-CM | POA: Diagnosis not present

## 2023-01-30 DIAGNOSIS — H1789 Other corneal scars and opacities: Secondary | ICD-10-CM | POA: Diagnosis not present

## 2023-01-30 DIAGNOSIS — D6869 Other thrombophilia: Secondary | ICD-10-CM | POA: Diagnosis not present

## 2023-01-30 DIAGNOSIS — M25552 Pain in left hip: Secondary | ICD-10-CM | POA: Diagnosis not present

## 2023-01-30 DIAGNOSIS — I4819 Other persistent atrial fibrillation: Secondary | ICD-10-CM | POA: Diagnosis not present

## 2023-01-30 DIAGNOSIS — H15831 Staphyloma posticum, right eye: Secondary | ICD-10-CM | POA: Diagnosis not present

## 2023-01-30 DIAGNOSIS — H35341 Macular cyst, hole, or pseudohole, right eye: Secondary | ICD-10-CM | POA: Diagnosis not present

## 2023-01-30 DIAGNOSIS — H18231 Secondary corneal edema, right eye: Secondary | ICD-10-CM | POA: Diagnosis not present

## 2023-01-30 DIAGNOSIS — D509 Iron deficiency anemia, unspecified: Secondary | ICD-10-CM | POA: Diagnosis not present

## 2023-01-30 DIAGNOSIS — H35351 Cystoid macular degeneration, right eye: Secondary | ICD-10-CM | POA: Diagnosis not present

## 2023-01-30 DIAGNOSIS — Z23 Encounter for immunization: Secondary | ICD-10-CM | POA: Diagnosis not present

## 2023-01-30 DIAGNOSIS — G8929 Other chronic pain: Secondary | ICD-10-CM | POA: Diagnosis not present

## 2023-01-30 DIAGNOSIS — H4421 Degenerative myopia, right eye: Secondary | ICD-10-CM | POA: Diagnosis not present

## 2023-01-30 DIAGNOSIS — I10 Essential (primary) hypertension: Secondary | ICD-10-CM | POA: Diagnosis not present

## 2023-01-30 DIAGNOSIS — Z7901 Long term (current) use of anticoagulants: Secondary | ICD-10-CM | POA: Diagnosis not present

## 2023-01-30 DIAGNOSIS — H3122 Choroidal dystrophy (central areolar) (generalized) (peripapillary): Secondary | ICD-10-CM | POA: Diagnosis not present

## 2023-01-30 DIAGNOSIS — H35371 Puckering of macula, right eye: Secondary | ICD-10-CM | POA: Diagnosis not present

## 2023-01-30 DIAGNOSIS — E7800 Pure hypercholesterolemia, unspecified: Secondary | ICD-10-CM | POA: Diagnosis not present

## 2023-02-22 ENCOUNTER — Ambulatory Visit: Payer: Medicare PPO | Attending: Cardiology | Admitting: Cardiology

## 2023-02-22 ENCOUNTER — Encounter: Payer: Self-pay | Admitting: Cardiology

## 2023-02-22 VITALS — BP 152/63 | HR 59 | Ht 65.0 in | Wt 158.4 lb

## 2023-02-22 DIAGNOSIS — I5032 Chronic diastolic (congestive) heart failure: Secondary | ICD-10-CM

## 2023-02-22 DIAGNOSIS — I10 Essential (primary) hypertension: Secondary | ICD-10-CM | POA: Diagnosis not present

## 2023-02-22 DIAGNOSIS — Z79899 Other long term (current) drug therapy: Secondary | ICD-10-CM | POA: Diagnosis not present

## 2023-02-22 DIAGNOSIS — I480 Paroxysmal atrial fibrillation: Secondary | ICD-10-CM | POA: Diagnosis not present

## 2023-02-22 NOTE — Patient Instructions (Signed)
Medication Instructions:  Continue all medications *If you need a refill on your cardiac medications before your next appointment, please call your pharmacy*   Lab Work: None ordered   Testing/Procedures: None ordered   Follow-Up: At [facility], you and your health needs are our priority.  As part of our continuing mission to provide you with exceptional heart care, we have created designated Provider Care Teams.  These Care Teams include your primary Cardiologist (physician) and Advanced Practice Providers (APPs -  Physician Assistants and Nurse Practitioners) who all work together to provide you with the care you need, when you need it.  We recommend signing up for the patient portal called "MyChart".  Sign up information is provided on this After Visit Summary.  MyChart is used to connect with patients for Virtual Visits (Telemedicine).  Patients are able to view lab/test results, encounter notes, upcoming appointments, etc.  Non-urgent messages can be sent to your provider as well.   To learn more about what you can do with MyChart, go to [email or web address].    Your next appointment:  6 months     Call in Jan to schedule April appointment   Provider:  Dr.Jordan

## 2023-02-26 DIAGNOSIS — S52501D Unspecified fracture of the lower end of right radius, subsequent encounter for closed fracture with routine healing: Secondary | ICD-10-CM | POA: Diagnosis not present

## 2023-03-01 DIAGNOSIS — M1612 Unilateral primary osteoarthritis, left hip: Secondary | ICD-10-CM | POA: Diagnosis not present

## 2023-03-06 DIAGNOSIS — H401110 Primary open-angle glaucoma, right eye, stage unspecified: Secondary | ICD-10-CM | POA: Diagnosis not present

## 2023-03-06 DIAGNOSIS — H4421 Degenerative myopia, right eye: Secondary | ICD-10-CM | POA: Diagnosis not present

## 2023-03-06 DIAGNOSIS — H18421 Band keratopathy, right eye: Secondary | ICD-10-CM | POA: Diagnosis not present

## 2023-03-06 DIAGNOSIS — H1789 Other corneal scars and opacities: Secondary | ICD-10-CM | POA: Diagnosis not present

## 2023-03-06 DIAGNOSIS — H35431 Paving stone degeneration of retina, right eye: Secondary | ICD-10-CM | POA: Diagnosis not present

## 2023-03-06 DIAGNOSIS — H338 Other retinal detachments: Secondary | ICD-10-CM | POA: Diagnosis not present

## 2023-03-06 DIAGNOSIS — H15831 Staphyloma posticum, right eye: Secondary | ICD-10-CM | POA: Diagnosis not present

## 2023-03-19 ENCOUNTER — Other Ambulatory Visit: Payer: Self-pay | Admitting: Cardiology

## 2023-03-19 DIAGNOSIS — I4891 Unspecified atrial fibrillation: Secondary | ICD-10-CM

## 2023-03-19 NOTE — Telephone Encounter (Signed)
Eliquis 5mg  refill request received. Patient is 89 years old, weight-71.8kg, Crea-0.94 on 12/17/22, Diagnosis-Afib, and last seen by Dr. Swaziland on 02/22/23. Dose is appropriate based on dosing criteria. Will send in refill to requested pharmacy.

## 2023-03-22 ENCOUNTER — Other Ambulatory Visit: Payer: Self-pay | Admitting: Cardiology

## 2023-05-06 DIAGNOSIS — H1789 Other corneal scars and opacities: Secondary | ICD-10-CM | POA: Diagnosis not present

## 2023-05-06 DIAGNOSIS — H35371 Puckering of macula, right eye: Secondary | ICD-10-CM | POA: Diagnosis not present

## 2023-05-06 DIAGNOSIS — H59811 Chorioretinal scars after surgery for detachment, right eye: Secondary | ICD-10-CM | POA: Diagnosis not present

## 2023-05-06 DIAGNOSIS — H18421 Band keratopathy, right eye: Secondary | ICD-10-CM | POA: Diagnosis not present

## 2023-05-06 DIAGNOSIS — H53411 Scotoma involving central area, right eye: Secondary | ICD-10-CM | POA: Diagnosis not present

## 2023-06-14 DIAGNOSIS — M1612 Unilateral primary osteoarthritis, left hip: Secondary | ICD-10-CM | POA: Diagnosis not present

## 2023-06-25 ENCOUNTER — Other Ambulatory Visit: Payer: Self-pay | Admitting: Cardiology

## 2023-07-04 DIAGNOSIS — H401114 Primary open-angle glaucoma, right eye, indeterminate stage: Secondary | ICD-10-CM | POA: Diagnosis not present

## 2023-07-04 DIAGNOSIS — H04123 Dry eye syndrome of bilateral lacrimal glands: Secondary | ICD-10-CM | POA: Diagnosis not present

## 2023-09-02 DIAGNOSIS — Z970 Presence of artificial eye: Secondary | ICD-10-CM | POA: Diagnosis not present

## 2023-09-02 DIAGNOSIS — H18891 Other specified disorders of cornea, right eye: Secondary | ICD-10-CM | POA: Diagnosis not present

## 2023-09-02 DIAGNOSIS — Z9889 Other specified postprocedural states: Secondary | ICD-10-CM | POA: Diagnosis not present

## 2023-09-02 DIAGNOSIS — H401114 Primary open-angle glaucoma, right eye, indeterminate stage: Secondary | ICD-10-CM | POA: Diagnosis not present

## 2023-09-02 DIAGNOSIS — H16231 Neurotrophic keratoconjunctivitis, right eye: Secondary | ICD-10-CM | POA: Diagnosis not present

## 2023-09-02 DIAGNOSIS — H04123 Dry eye syndrome of bilateral lacrimal glands: Secondary | ICD-10-CM | POA: Diagnosis not present

## 2023-09-24 ENCOUNTER — Other Ambulatory Visit: Payer: Self-pay | Admitting: Cardiology

## 2023-09-24 ENCOUNTER — Other Ambulatory Visit: Payer: Self-pay | Admitting: Nurse Practitioner

## 2023-09-24 DIAGNOSIS — Z4422 Encounter for fitting and adjustment of artificial left eye: Secondary | ICD-10-CM | POA: Diagnosis not present

## 2023-09-24 DIAGNOSIS — I4891 Unspecified atrial fibrillation: Secondary | ICD-10-CM

## 2023-09-24 NOTE — Telephone Encounter (Signed)
 Pt last saw Dr Swaziland 02/22/23, last labs 12/17/22 Creat 0.94, age 89, weight 71.8kg, based on specified criteria pt is on appropriate dosage of Eliquis  5mg  BID for afib.  Will refill rx.

## 2023-09-28 NOTE — Progress Notes (Unsigned)
 f    Cardiology Office Note   Date:  10/01/2023   ID:  Regina Taylor, DOB December 26, 1927, MRN [id number]  PCP:  Faustina Hood, MD  Cardiologist:   Giulliana Mcroberts [place], MD   Chief Complaint  Patient presents with   Shortness of Breath    Pt says at timed with walking    Edema       History of Present Illness: Regina Taylor is a 89 y.o. female who presents for follow up Afib. She has a PMH of SVT, GERD, atrial fibrillation, hyponatremia, diastolic CHF, and glaucoma.   She was  admitted to the hospital 03/17/2020. She presented to the emergency department with shortness of breath and orthopnea.    She  was found to have A. fib with RVR.  Her echocardiogram showed an EF of 60-65%, moderate LVH, mild MR, AR, mild aortic valve sclerosis, mild pulmonary hypertension and severe LAE.  She was placed on Eliquis  5 mg twice daily, and rate control with Cardizem  and  Metoprolol . She was diuresed but not continued on diuretics.    She continued to have significant symptoms of dyspnea with any exertion. After discussion of treatment options she was started on amiodarone .  She subsequently underwent elective DCCV on 08/25/20. She did develop GI side effects on amiodarone  so her dose was reduced to 100 mg daily. This did help her GI side effects and she is tolerating well. She still notes a lack of energy and some SOB. Is limited by arthritis in her hip.   She was seen in the ED on 04/09/22 for nosebleed treated with Rhinorocket.   On follow up today she is doing pretty well. Takes lasix  every other day. BP is stable. No chest pain. Limited mainly by arthritis in her left hip. May have some SOB if overdoes it. No chest pain    Past Medical History:  Diagnosis Date   Arthritis    Blindness of left eye    Fatigue    GERD (gastroesophageal reflux disease)    Glaucoma    Macular degeneration    MVP (mitral valve prolapse)    Palpitations    SVT (supraventricular tachycardia) (HCC)     Past Surgical  History:  Procedure Laterality Date   CARDIOVERSION N/A 08/25/2020   Procedure: CARDIOVERSION;  Surgeon: Wendie Hamburg, MD;  Location: [facility] ENDOSCOPY;  Service: Cardiovascular;  Laterality: N/A;   CATARAC     CATARACT EXTRACTION     PARTIAL HIP ARTHROPLASTY     RETINAL DETACHMENT SURGERY     TOTAL HIP ARTHROPLASTY Right 1990     Current Outpatient Medications  Medication Sig Dispense Refill   amiodarone  (PACERONE ) 200 MG tablet Take 1/2 tablet (100 mg) daily for 6 days, hold on Sunday. 36 tablet 2   BION TEARS PF 0.1-0.3 % SOLN SMARTSIG:1 Drop(s) Right Eye PRN     COSOPT PF 2-0.5 % SOLN ophthalmic solution Place 1 drop into the right eye 2 (two) times daily.     diltiazem  (CARDIZEM  CD) 240 MG 24 hr capsule TAKE ONE CAPSULE BY MOUTH ONCE DAILY 90 capsule 3   ELIQUIS  5 MG TABS tablet TAKE ONE TABLET BY MOUTH TWICE DAILY 180 tablet 1   Ferrous Sulfate (IRON) 325 (65 Fe) MG TABS 1 tablet Orally once a day     furosemide  (LASIX ) 40 MG tablet Take 1 tablet daily for 3 days, then take 1 TAB DAILY (40 mg) daily as needed only for weight gain of 3 lb  over night or 5 lb in 1 week. 90 tablet 1   hypromellose (GENTEAL) 0.3 % GEL ophthalmic ointment Place 1 drop into the right eye at bedtime.     metoprolol  succinate (TOPROL -XL) 50 MG 24 hr tablet TAKE ONE TABLET BY MOUTH DAILY 90 tablet 2   Multiple Vitamins-Minerals (ICAPS PO) Take 1 capsule by mouth daily.     olmesartan (BENICAR) 40 MG tablet Take 40 mg by mouth daily.     REFRESH CELLUVISC 1 % GEL Place 1 drop into the right eye at bedtime.     Serum Base LIQD 1 drop.     Tafluprost, PF, 0.0015 % SOLN Place 1 drop into the right eye at bedtime.     No current facility-administered medications for this visit.    Allergies:   Shellfish-derived products and Iodinated contrast media    Social History:  The patient  reports that she quit smoking about 44 years ago. Her smoking use included cigarettes. She has never used smokeless tobacco.  She reports current alcohol  use. She reports that she does not use drugs.   Family History:  The patient's family history includes Cancer in her mother; Heart attack in her brother; Heart disease in her father.    ROS:  Please see the history of present illness.   Otherwise, review of systems are positive for none.   All other systems are reviewed and negative.    PHYSICAL EXAM: VS:  BP (!) 150/70 (BP Location: Left Arm, Patient Position: Sitting, Cuff Size: Normal)   Pulse (!) 58   Ht 5\' 5"  (1.651 m)   Wt 159 lb (72.1 kg)   SpO2 98%   BMI 26.46 kg/m  , BMI Body mass index is 26.46 kg/m. GEN: Well nourished, well developed, in no acute distress  HEENT: normal  Neck: no JVD, carotid bruits, or masses Cardiac: RRR; no murmurs, rubs, or gallops, 1+ edema left leg. No erythema Respiratory:  clear to auscultation bilaterally, normal work of breathing GI: soft, nontender, nondistended, + BS MS: no deformity or atrophy  Skin: warm and dry, no rash Neuro:  Strength and sensation are intact Psych: euthymic mood, full affect         Recent Labs: 12/17/2022: BUN 30; Creatinine, Ser 0.94; Potassium 4.1; Sodium 140    Lipid Panel    Component Value Date/Time   CHOL 147 01/25/2014 2340   TRIG 63 01/25/2014 2340   HDL 47 01/25/2014 2340   CHOLHDL 3.1 01/25/2014 2340   VLDL 13 01/25/2014 2340   LDLCALC 87 01/25/2014 2340    Dated 06/13/20: Normal CMET and CBC Dated 06/12/21: cholesterol 226, triglycerides 106, HDL 62, LDL 145 Dated 02/05/22:CMET mormal Dated 04/09/22: CBC normal. Dated 07/03/22: cholesterol 206, triglycerides 127, HDL 63, LDL 120. TSH normal.  Dated 01/30/23: Hgb 10.8. otherwise CMET and CBC normal.   Wt Readings from Last 3 Encounters:  10/01/23 159 lb (72.1 kg)  02/22/23 158 lb 6.4 oz (71.8 kg)  11/28/22 157 lb 8 oz (71.4 kg)      Other studies Reviewed: Additional studies/ records that were reviewed today include:   Echo 03/18/20: IMPRESSIONS     1.  Left ventricular ejection fraction, by estimation, is 60 to 65%. The  left ventricle has normal function. The left ventricle has no regional  wall motion abnormalities. There is moderate concentric left ventricular  hypertrophy. Left ventricular  diastolic function could not be evaluated.   2. Right ventricular systolic function is normal. The right  ventricular  size is normal. There is moderately elevated pulmonary artery systolic  pressure. The estimated right ventricular systolic pressure is 45.2 mmHg.   3. Left atrial size was severely dilated.   4. Right atrial size was mildly dilated.   5. The mitral valve is degenerative. Trivial mitral valve regurgitation.  No evidence of mitral stenosis.   6. The aortic valve is tricuspid. There is mild calcification of the  aortic valve. There is mild thickening of the aortic valve. Aortic valve  regurgitation is mild. Mild aortic valve sclerosis is present, with no  evidence of aortic valve stenosis.   7. The inferior vena cava is dilated in size with <50% respiratory  variability, suggesting right atrial pressure of 15 mmHg.   Comparison(s): Changes from prior study are noted. In Afib with RVR. EF  remains the same. RVSP ~45 mmHG on this study  ASSESSMENT AND PLAN:   1.  Atrial fibrillation -persistent  - severe LAE - on metoprolol  and Cardizem  for rate control.  - on  apixaban  for anticoagulation.  - has been on amiodarone  low dose - s/p DCCV on 4/7//22 - labs per Dr Felipe Horton - continue therapy   2. Chronic diastolic CHF-   Chronic class 2 symptoms. Continue metoprolol , Cardizem  Does not appear volume overloaded Daily weights Sodium restriction Continue lasix  every other day    3. Hypertension- BP is elevated but stable. Will monitor for now. On 4 agents currently. If BP is consistently > 160 could add hydralazine but I would guard against overly aggressive therapy at her age.        Disposition:   FU 6 months with  APP  Signed, Heywood Tokunaga [place], MD  10/01/2023 10:23 AM    [facility] [address], [place], [place], [address] Phone [phone], Fax [phone]

## 2023-09-30 ENCOUNTER — Ambulatory Visit: Payer: Medicare PPO | Admitting: Cardiology

## 2023-10-01 ENCOUNTER — Ambulatory Visit: Payer: Medicare PPO | Attending: Cardiology | Admitting: Cardiology

## 2023-10-01 ENCOUNTER — Encounter: Payer: Self-pay | Admitting: Cardiology

## 2023-10-01 VITALS — BP 150/70 | HR 58 | Ht 65.0 in | Wt 159.0 lb

## 2023-10-01 DIAGNOSIS — I4819 Other persistent atrial fibrillation: Secondary | ICD-10-CM

## 2023-10-01 DIAGNOSIS — I5032 Chronic diastolic (congestive) heart failure: Secondary | ICD-10-CM

## 2023-10-01 DIAGNOSIS — I10 Essential (primary) hypertension: Secondary | ICD-10-CM | POA: Diagnosis not present

## 2023-10-01 NOTE — Patient Instructions (Signed)
 Medication Instructions:  Continue same medications *If you need a refill on your cardiac medications before your next appointment, please call your pharmacy*  Lab Work: None ordered  Testing/Procedures: None ordered  Follow-Up: At [facility], you and your health needs are our priority.  As part of our continuing mission to provide you with exceptional heart care, our providers are all part of one team.  This team includes your primary Cardiologist (physician) and Advanced Practice Providers or APPs (Physician Assistants and Nurse Practitioners) who all work together to provide you with the care you need, when you need it.  Your next appointment:  6 months    Call in July to schedule Nov appointment     Provider:  Marlana Silvan NP   We recommend signing up for the patient portal called "MyChart".  Sign up information is provided on this After Visit Summary.  MyChart is used to connect with patients for Virtual Visits (Telemedicine).  Patients are able to view lab/test results, encounter notes, upcoming appointments, etc.  Non-urgent messages can be sent to your provider as well.   To learn more about what you can do with MyChart, go to [email or web address].

## 2023-10-03 DIAGNOSIS — Z0000 Encounter for general adult medical examination without abnormal findings: Secondary | ICD-10-CM | POA: Diagnosis not present

## 2023-10-03 DIAGNOSIS — H409 Unspecified glaucoma: Secondary | ICD-10-CM | POA: Diagnosis not present

## 2023-10-03 DIAGNOSIS — D509 Iron deficiency anemia, unspecified: Secondary | ICD-10-CM | POA: Diagnosis not present

## 2023-10-03 DIAGNOSIS — I4819 Other persistent atrial fibrillation: Secondary | ICD-10-CM | POA: Diagnosis not present

## 2023-10-03 DIAGNOSIS — Z1331 Encounter for screening for depression: Secondary | ICD-10-CM | POA: Diagnosis not present

## 2023-10-03 DIAGNOSIS — E7800 Pure hypercholesterolemia, unspecified: Secondary | ICD-10-CM | POA: Diagnosis not present

## 2023-10-03 DIAGNOSIS — D6869 Other thrombophilia: Secondary | ICD-10-CM | POA: Diagnosis not present

## 2023-10-03 DIAGNOSIS — I10 Essential (primary) hypertension: Secondary | ICD-10-CM | POA: Diagnosis not present

## 2023-10-28 DIAGNOSIS — M1612 Unilateral primary osteoarthritis, left hip: Secondary | ICD-10-CM | POA: Diagnosis not present

## 2023-11-08 DIAGNOSIS — H401114 Primary open-angle glaucoma, right eye, indeterminate stage: Secondary | ICD-10-CM | POA: Diagnosis not present

## 2023-11-08 DIAGNOSIS — H18891 Other specified disorders of cornea, right eye: Secondary | ICD-10-CM | POA: Diagnosis not present

## 2023-11-08 DIAGNOSIS — H16231 Neurotrophic keratoconjunctivitis, right eye: Secondary | ICD-10-CM | POA: Diagnosis not present

## 2023-11-08 DIAGNOSIS — H04123 Dry eye syndrome of bilateral lacrimal glands: Secondary | ICD-10-CM | POA: Diagnosis not present

## 2023-11-08 DIAGNOSIS — Z970 Presence of artificial eye: Secondary | ICD-10-CM | POA: Diagnosis not present

## 2023-11-08 DIAGNOSIS — Z9889 Other specified postprocedural states: Secondary | ICD-10-CM | POA: Diagnosis not present

## 2023-11-08 DIAGNOSIS — H04121 Dry eye syndrome of right lacrimal gland: Secondary | ICD-10-CM | POA: Diagnosis not present

## 2023-11-26 DIAGNOSIS — H35371 Puckering of macula, right eye: Secondary | ICD-10-CM | POA: Diagnosis not present

## 2023-11-26 DIAGNOSIS — H18421 Band keratopathy, right eye: Secondary | ICD-10-CM | POA: Diagnosis not present

## 2023-11-26 DIAGNOSIS — H442E1 Degenerative myopia with other maculopathy, right eye: Secondary | ICD-10-CM | POA: Diagnosis not present

## 2023-11-26 DIAGNOSIS — Z970 Presence of artificial eye: Secondary | ICD-10-CM | POA: Diagnosis not present

## 2023-11-26 DIAGNOSIS — H59811 Chorioretinal scars after surgery for detachment, right eye: Secondary | ICD-10-CM | POA: Diagnosis not present

## 2023-12-04 DIAGNOSIS — M79675 Pain in left toe(s): Secondary | ICD-10-CM | POA: Diagnosis not present

## 2023-12-12 ENCOUNTER — Encounter: Payer: Self-pay | Admitting: Podiatry

## 2023-12-12 ENCOUNTER — Ambulatory Visit: Payer: Self-pay | Admitting: Podiatry

## 2023-12-12 DIAGNOSIS — D2372 Other benign neoplasm of skin of left lower limb, including hip: Secondary | ICD-10-CM | POA: Diagnosis not present

## 2023-12-12 DIAGNOSIS — D2371 Other benign neoplasm of skin of right lower limb, including hip: Secondary | ICD-10-CM

## 2023-12-15 NOTE — Progress Notes (Signed)
 Subjective:  Patient ID: Regina Taylor, female    DOB: 05/20/1928,  MRN: [id number] HPI Chief Complaint  Patient presents with   Toe Pain    3rd toe right/hallux right - callused areas x months, 3rd toe sore   New Patient (Initial Visit)    89 y.o. female presents with the above complaint.   ROS: Denies fever chills nausea muscle aches pains calf pain back pain chest pain shortness of breath.  Past Medical History:  Diagnosis Date   Arthritis    Blindness of left eye    Fatigue    GERD (gastroesophageal reflux disease)    Glaucoma    Macular degeneration    MVP (mitral valve prolapse)    Palpitations    SVT (supraventricular tachycardia) (HCC)    Past Surgical History:  Procedure Laterality Date   CARDIOVERSION N/A 08/25/2020   Procedure: CARDIOVERSION;  Surgeon: Kate Lonni CROME, MD;  Location: [facility] ENDOSCOPY;  Service: Cardiovascular;  Laterality: N/A;   CATARAC     CATARACT EXTRACTION     PARTIAL HIP ARTHROPLASTY     RETINAL DETACHMENT SURGERY     TOTAL HIP ARTHROPLASTY Right 1990    Current Outpatient Medications:    amiodarone  (PACERONE ) 200 MG tablet, Take 1/2 tablet (100 mg) daily for 6 days, hold on Sunday., Disp: 36 tablet, Rfl: 2   BION TEARS PF 0.1-0.3 % SOLN, SMARTSIG:1 Drop(s) Right Eye PRN, Disp: , Rfl:    COSOPT PF 2-0.5 % SOLN ophthalmic solution, Place 1 drop into the right eye 2 (two) times daily., Disp: , Rfl:    diltiazem  (CARDIZEM  CD) 240 MG 24 hr capsule, TAKE ONE CAPSULE BY MOUTH ONCE DAILY, Disp: 90 capsule, Rfl: 3   ELIQUIS  5 MG TABS tablet, TAKE ONE TABLET BY MOUTH TWICE DAILY, Disp: 180 tablet, Rfl: 1   Ferrous Sulfate (IRON) 325 (65 Fe) MG TABS, 1 tablet Orally once a day, Disp: , Rfl:    furosemide  (LASIX ) 40 MG tablet, Take 1 tablet daily for 3 days, then take 1 TAB DAILY (40 mg) daily as needed only for weight gain of 3 lb over night or 5 lb in 1 week., Disp: 90 tablet, Rfl: 1   hypromellose (GENTEAL) 0.3 % GEL ophthalmic ointment,  Place 1 drop into the right eye at bedtime., Disp: , Rfl:    metoprolol  succinate (TOPROL -XL) 50 MG 24 hr tablet, TAKE ONE TABLET BY MOUTH DAILY, Disp: 90 tablet, Rfl: 2   Multiple Vitamins-Minerals (ICAPS PO), Take 1 capsule by mouth daily., Disp: , Rfl:    olmesartan (BENICAR) 40 MG tablet, Take 40 mg by mouth daily., Disp: , Rfl:    REFRESH CELLUVISC 1 % GEL, Place 1 drop into the right eye at bedtime., Disp: , Rfl:    Serum Base LIQD, 1 drop., Disp: , Rfl:    Tafluprost, PF, 0.0015 % SOLN, Place 1 drop into the right eye at bedtime., Disp: , Rfl:   Allergies  Allergen Reactions   Shellfish-Derived Products Anaphylaxis   Iodinated Contrast Media Nausea And Vomiting   Review of Systems Objective:  There were no vitals filed for this visit.  General: Well developed, nourished, in no acute distress, alert and oriented x3   Dermatological: Skin is warm, dry and supple bilateral. Nails x 10 are well maintained; remaining integument appears unremarkable at this time. There are no open sores, no preulcerative lesions, no rash or signs of infection present.  Distal clavus third digit left foot  Vascular: Dorsalis Pedis  artery and Posterior Tibial artery pedal pulses are 2/4 bilateral with immedate capillary fill time. Pedal hair growth present. No varicosities and no lower extremity edema present bilateral.   Neruologic: Grossly intact via light touch bilateral. Vibratory intact via tuning fork bilateral. Protective threshold with Semmes Wienstein monofilament intact to all pedal sites bilateral. Patellar and Achilles deep tendon reflexes 2+ bilateral. No Babinski or clonus noted bilateral.   Musculoskeletal: No gross boney pedal deformities bilateral. No pain, crepitus, or limitation noted with foot and ankle range of motion bilateral. Muscular strength 5/5 in all groups tested bilateral.  Rigid hammertoe deformities bilateral  Gait: Unassisted, Nonantalgic.    Radiographs:  None  taken  Assessment & Plan:   Assessment: Hammertoe deformities resulting in benign skin lesion left third toe  Plan: May will follow-up with her on a routine debridement cycle.     Gerica Koble T. [place], [place]

## 2023-12-23 ENCOUNTER — Other Ambulatory Visit: Payer: Self-pay | Admitting: Cardiology

## 2024-03-12 ENCOUNTER — Other Ambulatory Visit: Payer: Self-pay | Admitting: Cardiology

## 2024-03-12 DIAGNOSIS — I4891 Unspecified atrial fibrillation: Secondary | ICD-10-CM

## 2024-03-13 NOTE — Telephone Encounter (Signed)
 Eliquis  5mg  refill request received. Patient is 89 years old, weight-72.1kg, Crea-0.93 on 10/03/23 via [facility] from [place], [place], and last seen by Dr. Swaziland on 10/01/23. Dose is appropriate based on dosing criteria. Will send in refill to requested pharmacy.

## 2024-03-14 ENCOUNTER — Other Ambulatory Visit: Payer: Self-pay | Admitting: Nurse Practitioner

## 2024-03-23 ENCOUNTER — Other Ambulatory Visit: Payer: Self-pay | Admitting: Cardiology

## 2024-03-24 ENCOUNTER — Telehealth: Payer: Self-pay | Admitting: Nurse Practitioner

## 2024-03-24 MED ORDER — DILTIAZEM HCL ER COATED BEADS 240 MG PO CP24: 240.0000 mg | ORAL_CAPSULE | Freq: Every day | ORAL | 1 refills | Status: AC

## 2024-03-24 NOTE — Telephone Encounter (Signed)
 RX sent in

## 2024-03-24 NOTE — Telephone Encounter (Signed)
*  STAT* If patient is at the pharmacy, call can be transferred to refill team.   1. Which medications need to be refilled? (please list name of each medication and dose if known)   diltiazem  (CARDIZEM  CD) 240 MG 24 hr capsule    2. Which pharmacy/location (including street and city if local pharmacy) is medication to be sent to? [facility] [place], [place] - [address] Phone: [phone]  Fax: [phone]     3. Do they need a 30 day or 90 day supply? 90

## 2024-03-30 DIAGNOSIS — Z4422 Encounter for fitting and adjustment of artificial left eye: Secondary | ICD-10-CM | POA: Diagnosis not present

## 2024-03-31 ENCOUNTER — Ambulatory Visit: Admitting: Podiatry

## 2024-03-31 DIAGNOSIS — Z8679 Personal history of other diseases of the circulatory system: Secondary | ICD-10-CM | POA: Insufficient documentation

## 2024-03-31 DIAGNOSIS — D689 Coagulation defect, unspecified: Secondary | ICD-10-CM

## 2024-03-31 DIAGNOSIS — L84 Corns and callosities: Secondary | ICD-10-CM | POA: Diagnosis not present

## 2024-03-31 DIAGNOSIS — D6859 Other primary thrombophilia: Secondary | ICD-10-CM | POA: Insufficient documentation

## 2024-03-31 DIAGNOSIS — E7800 Pure hypercholesterolemia, unspecified: Secondary | ICD-10-CM | POA: Insufficient documentation

## 2024-03-31 DIAGNOSIS — D509 Iron deficiency anemia, unspecified: Secondary | ICD-10-CM | POA: Insufficient documentation

## 2024-03-31 DIAGNOSIS — R1310 Dysphagia, unspecified: Secondary | ICD-10-CM | POA: Insufficient documentation

## 2024-03-31 DIAGNOSIS — H547 Unspecified visual loss: Secondary | ICD-10-CM | POA: Insufficient documentation

## 2024-03-31 DIAGNOSIS — B351 Tinea unguium: Secondary | ICD-10-CM

## 2024-04-06 DIAGNOSIS — E7800 Pure hypercholesterolemia, unspecified: Secondary | ICD-10-CM | POA: Diagnosis not present

## 2024-04-06 DIAGNOSIS — D6869 Other thrombophilia: Secondary | ICD-10-CM | POA: Diagnosis not present

## 2024-04-06 DIAGNOSIS — I4819 Other persistent atrial fibrillation: Secondary | ICD-10-CM | POA: Diagnosis not present

## 2024-04-06 DIAGNOSIS — Z8679 Personal history of other diseases of the circulatory system: Secondary | ICD-10-CM | POA: Diagnosis not present

## 2024-04-06 DIAGNOSIS — I10 Essential (primary) hypertension: Secondary | ICD-10-CM | POA: Diagnosis not present

## 2024-04-06 DIAGNOSIS — H409 Unspecified glaucoma: Secondary | ICD-10-CM | POA: Diagnosis not present

## 2024-04-06 DIAGNOSIS — R609 Edema, unspecified: Secondary | ICD-10-CM | POA: Diagnosis not present

## 2024-04-09 NOTE — Progress Notes (Signed)
  Subjective:  Patient ID: Regina Taylor, female    DOB: 1927/07/10,  MRN: [id number]  Regina Taylor presents to clinic today for at risk foot care with h/o coagulation defect and corn(s) distal tip of left 3rd toe and painful thick toenails that are difficult to trim. Painful toenails interfere with ambulation. Aggravating factors include wearing enclosed shoe gear. Pain is relieved with periodic professional debridement. Painful corns are aggravated when weightbearing when wearing enclosed shoe gear. Pain is relieved with periodic professional debridement.  Chief Complaint  Patient presents with   Hammer Toe    Bilateral -painful callus lesions - patient is taking Eliquis    New problem(s): None.   PCP is Claudene Pellet, MD.  Allergies  Allergen Reactions   Shellfish Protein-Containing Drug Products Anaphylaxis   Iodinated Contrast Media Nausea And Vomiting    Review of Systems: Negative except as noted in the HPI.  Objective: No changes noted in today's physical examination. There were no vitals filed for this visit. Regina Taylor is a pleasant 89 y.o. female WD, WN in NAD. AAO x 3.  Vascular Examination: Capillary refill time immediate b/l. Palpable pedal pulses. Pedal hair present b/l. Pedal edema absent. No pain with calf compression b/l. Skin temperature gradient WNL b/l. No cyanosis or clubbing b/l. No ischemia or gangrene noted b/l.   Neurological Examination: Sensation grossly intact b/l with 10 gram monofilament. Vibratory sensation intact b/l.   Dermatological Examination: Pedal skin with normal turgor, texture and tone b/l.  No open wounds. No interdigital macerations.   Toenails 1-5 b/l thick, discolored, elongated with subungual debris and pain on dorsal palpation.   Hyperkeratotic lesion(s) distal tip of left 3rd toe.  No erythema, no edema, no drainage, no fluctuance.  Musculoskeletal Examination: Muscle strength 5/5 to all lower extremity muscle groups  bilaterally. No pain, crepitus or joint limitation noted with ROM b/l LE. Hammertoe(s) 2-5 b/l.Regina Taylor Patient ambulates with walker assistance.  Radiographs: None  Assessment/Plan: 1. Corns   2. Coagulation defect    -Patient was evaluated today. All questions/concerns addressed on today's visit. -Patient to continue soft, supportive shoe gear daily. -Corn(s) distal tip of left 3rd toe pared utilizing sterile scalpel blade without complication or incident. Total number debrided=1. -As a courtesy, toenails 1-5 b/l were debrided in length and girth with sterile nail nippers and dremel file without incident. -Patient/POA to call should there be question/concern in the interim.   No follow-ups on file.  Delon LITTIE Merlin, DPM      [place] LOCATION: [address], [place] [address]                   Office [phone]   [facility] LOCATION: [address] [place], [place] [address] Office [phone]

## 2024-04-10 ENCOUNTER — Encounter: Payer: Self-pay | Admitting: Podiatry

## 2024-05-05 ENCOUNTER — Encounter: Payer: Self-pay | Admitting: Nurse Practitioner

## 2024-05-05 ENCOUNTER — Ambulatory Visit: Attending: Nurse Practitioner | Admitting: Nurse Practitioner

## 2024-05-05 VITALS — BP 138/70 | HR 68 | Ht 65.0 in | Wt 159.0 lb

## 2024-05-05 DIAGNOSIS — R0609 Other forms of dyspnea: Secondary | ICD-10-CM

## 2024-05-05 DIAGNOSIS — I471 Supraventricular tachycardia, unspecified: Secondary | ICD-10-CM

## 2024-05-05 DIAGNOSIS — I4819 Other persistent atrial fibrillation: Secondary | ICD-10-CM

## 2024-05-05 DIAGNOSIS — I10 Essential (primary) hypertension: Secondary | ICD-10-CM

## 2024-05-05 DIAGNOSIS — I5032 Chronic diastolic (congestive) heart failure: Secondary | ICD-10-CM

## 2024-05-05 MED ORDER — AMIODARONE HCL 100 MG PO TABS: ORAL_TABLET | ORAL | 3 refills | Status: AC

## 2024-05-05 NOTE — Patient Instructions (Signed)
 Medication Instructions:  Your physician recommends that you continue on your current medications as directed. Please refer to the Current Medication list given to you today.  *If you need a refill on your cardiac medications before your next appointment, please call your pharmacy*  Lab Work: CBC, CMET, TSH today  Testing/Procedures: NONE ordered at this time of appointment   Follow-Up: At [facility], you and your health needs are our priority.  As part of our continuing mission to provide you with exceptional heart care, our providers are all part of one team.  This team includes your primary Cardiologist (physician) and Advanced Practice Providers or APPs (Physician Assistants and Nurse Practitioners) who all work together to provide you with the care you need, when you need it.  Your next appointment:   6 month(s)  Provider:   Peter Jordan, MD    We recommend signing up for the patient portal called MyChart.  Sign up information is provided on this After Visit Summary.  MyChart is used to connect with patients for Virtual Visits (Telemedicine).  Patients are able to view lab/test results, encounter notes, upcoming appointments, etc.  Non-urgent messages can be sent to your provider as well.   To learn more about what you can do with MyChart, go to [email or web address].

## 2024-05-05 NOTE — Progress Notes (Unsigned)
 Office Visit    Patient Name: Regina Taylor Date of Encounter: 05/05/2024  Primary Care Provider:  Claudene Pellet, MD Primary Cardiologist:  Peter Jordan, MD  Chief Complaint    89 year old female with a history of persistent atrial fibrillation, PSVT, chronic diastolic heart failure, macular degeneration, glaucoma, and GERD who presents for follow-up related to atrial fibrillation and for preoperative cardiac evaluation.   Past Medical History    Past Medical History:  Diagnosis Date   Arthritis    Blindness of left eye    Fatigue    GERD (gastroesophageal reflux disease)    Glaucoma    Macular degeneration    MVP (mitral valve prolapse)    Palpitations    SVT (supraventricular tachycardia)    Past Surgical History:  Procedure Laterality Date   CARDIOVERSION N/A 08/25/2020   Procedure: CARDIOVERSION;  Surgeon: Kate Lonni CROME, MD;  Location: [facility] ENDOSCOPY;  Service: Cardiovascular;  Laterality: N/A;   CATARAC     CATARACT EXTRACTION     PARTIAL HIP ARTHROPLASTY     RETINAL DETACHMENT SURGERY     TOTAL HIP ARTHROPLASTY Right 1990    Allergies  Allergies[1]   Labs/Other Studies Reviewed    The following studies were reviewed today:  Cardiac Studies & Procedures   ______________________________________________________________________________________________     ECHOCARDIOGRAM  ECHOCARDIOGRAM COMPLETE 12/20/2022  Narrative ECHOCARDIOGRAM REPORT    Patient Name:   Regina Taylor Date of Exam: 12/20/2022 Medical Rec #:  [id number]       Height:       65.0 in Accession #:    [id number]      Weight:       157.5 lb Date of Birth:  03-12-1928        BSA:          1.787 m Patient Age:    89 years        BP:           142/84 mmHg Patient Gender: F               HR:           58 bpm. Exam Location:  [address]  Procedure: 2D Echo, Cardiac Doppler, Color Doppler, 3D Echo and Strain Analysis  Indications:    I51.89 Diastolic dysfunction  History:         Patient has prior history of Echocardiogram examinations, most recent 03/18/2020. CHF, Mitral Valve Prolapse, Arrythmias:Atrial Fibrillation, Signs/Symptoms:Fatigue, Chest Pain and Syncope; Risk Factors:Hypertension. PSVT. Aortic atherosclerosis. Palpitations. Dyspnea on exertion.  Sonographer:    Jon Hacker RCS Referring Phys: [id number] Cherylee Rawlinson C Bernyce Brimley  IMPRESSIONS   1. Left ventricular ejection fraction, by estimation, is 55 to 60%. Left ventricular ejection fraction by 3D volume is 57 %. The left ventricle has normal function. The left ventricle has no regional wall motion abnormalities. There is mild left ventricular hypertrophy of the basal-septal segment. Left ventricular diastolic parameters are consistent with Grade II diastolic dysfunction (pseudonormalization). Elevated left ventricular end-diastolic pressure. 2. Right ventricular systolic function is normal. The right ventricular size is normal. There is moderately elevated pulmonary artery systolic pressure. The estimated right ventricular systolic pressure is 49.0 mmHg. 3. Left atrial size was severely dilated. 4. The mitral valve is degenerative. Mild mitral valve regurgitation. No evidence of mitral stenosis. 5. The aortic valve is tricuspid. Aortic valve regurgitation is mild to moderate. Aortic valve sclerosis/calcification is present, without any evidence of aortic stenosis. Aortic regurgitation PHT measures 468 msec.  6. The inferior vena cava is dilated in size with >50% respiratory variability, suggesting right atrial pressure of 8 mmHg.  FINDINGS Left Ventricle: Left ventricular ejection fraction, by estimation, is 55 to 60%. Left ventricular ejection fraction by 3D volume is 57 %. The left ventricle has normal function. The left ventricle has no regional wall motion abnormalities. The left ventricular internal cavity size was normal in size. There is mild left ventricular hypertrophy of the basal-septal segment. Left  ventricular diastolic parameters are consistent with Grade II diastolic dysfunction (pseudonormalization). Elevated left ventricular end-diastolic pressure.  Right Ventricle: The right ventricular size is normal. No increase in right ventricular wall thickness. Right ventricular systolic function is normal. There is moderately elevated pulmonary artery systolic pressure. The tricuspid regurgitant velocity is 3.20 m/s, and with an assumed right atrial pressure of 8 mmHg, the estimated right ventricular systolic pressure is 49.0 mmHg.  Left Atrium: Left atrial size was severely dilated.  Right Atrium: Right atrial size was normal in size.  Pericardium: There is no evidence of pericardial effusion.  Mitral Valve: The mitral valve is degenerative in appearance. There is mild calcification of the mitral valve leaflet(s). Mild mitral annular calcification. Mild mitral valve regurgitation. No evidence of mitral valve stenosis.  Tricuspid Valve: The tricuspid valve is normal in structure. Tricuspid valve regurgitation is mild . No evidence of tricuspid stenosis.  Aortic Valve: The aortic valve is tricuspid. Aortic valve regurgitation is mild to moderate. Aortic regurgitation PHT measures 468 msec. Aortic valve sclerosis/calcification is present, without any evidence of aortic stenosis.  Pulmonic Valve: The pulmonic valve was normal in structure. Pulmonic valve regurgitation is not visualized. No evidence of pulmonic stenosis.  Aorta: The aortic root is normal in size and structure.  Venous: The inferior vena cava is dilated in size with greater than 50% respiratory variability, suggesting right atrial pressure of 8 mmHg.  IAS/Shunts: No atrial level shunt detected by color flow Doppler.   LEFT VENTRICLE PLAX 2D LVIDd:         4.40 cm         Diastology LVIDs:         2.30 cm         LV e' medial:    6.05 cm/s LV PW:         1.20 cm         LV E/e' medial:  17.2 LV IVS:        1.10 cm          LV e' lateral:   5.84 cm/s LVOT diam:     1.90 cm         LV E/e' lateral: 17.8 LV SV:         91 LV SV Index:   51              2D LVOT Area:     2.84 cm        Longitudinal Strain 2D Strain GLS  -18.6 % (A2C): 2D Strain GLS  -22.1 % (A3C): 2D Strain GLS  -18.9 % (A4C): 2D Strain GLS  -19.8 % Avg:  3D Volume EF LV 3D EF:    Left ventricul ar ejection fraction by 3D volume is 57 %.  3D Volume EF: 3D EF:        57 % LV EDV:       140 ml LV ESV:       60 ml LV SV:        80  ml  RIGHT VENTRICLE             IVC RV S prime:     13.50 cm/s  IVC diam: 2.40 cm TAPSE (M-mode): 2.5 cm RVSP:           44.0 mmHg  LEFT ATRIUM              Index        RIGHT ATRIUM           Index LA diam:        5.80 cm  3.25 cm/m   RA Pressure: 3.00 mmHg LA Vol (A2C):   114.0 ml 63.79 ml/m  RA Area:     15.90 cm LA Vol (A4C):   60.6 ml  33.91 ml/m  RA Volume:   34.40 ml  19.25 ml/m LA Biplane Vol: 86.1 ml  48.18 ml/m AORTIC VALVE LVOT Vmax:   137.00 cm/s LVOT Vmean:  75.200 cm/s LVOT VTI:    0.322 m AI PHT:      468 msec  AORTA Ao Root diam: 3.00 cm Ao Asc diam:  3.15 cm  MITRAL VALVE                TRICUSPID VALVE MV Area (PHT): 4.71 cm     TR Peak grad:   41.0 mmHg MV Decel Time: 161 msec     TR Vmax:        320.00 cm/s MV E velocity: 104.00 cm/s  Estimated RAP:  3.00 mmHg MV A velocity: 94.10 cm/s   RVSP:           44.0 mmHg MV E/A ratio:  1.11 SHUNTS Systemic VTI:  0.32 m Systemic Diam: 1.90 cm  Wilbert Bihari MD Electronically signed by Wilbert Bihari MD Signature Date/Time: 12/20/2022/3:25:56 PM    Final          ______________________________________________________________________________________________     Recent Labs: No results found for requested labs within last 365 days.  Recent Lipid Panel    Component Value Date/Time   CHOL 147 01/25/2014 2340   TRIG 63 01/25/2014 2340   HDL 47 01/25/2014 2340   CHOLHDL 3.1 01/25/2014 2340   VLDL 13  01/25/2014 2340   LDLCALC 87 01/25/2014 2340    History of Present Illness    89 year old female with the above past medical history including persistent atrial fibrillation, PSVT, chronic diastolic heart failure, macular degeneration, glaucoma, and GERD.    She was hospitalized in October 2021 in the setting of atrial fibrillation with RVR.  Echocardiogram the time showed EF 65%, moderate LVH, mild MR, AR, mild aortic valve sclerosis, mild pulmonary hypertension, severe LAE.  She was started on Eliquis , diltiazem  and metoprolol .  She underwent elective DCCV in 08/2020 and was subsequently started on amiodarone . She has a history of nosebleeds on Eliquis  but has remained on anticoagulation therapy.  Echocardiogram in 12/2022 showed EF 55 to 60%, normal LV function, no RWMA, mild LVH, G2 DD, normal RV systolic function, mild mitral valve regurgitation, mild to moderate aortic valve regurgitation, aortic valve sclerosis without evidence of aortic stenosis.she was last seen in office on 10/01/2023 and was stable from a cardiac standpoint.  BP was mildly elevated.  It was noted that should she have SBP consistently greater than 160 mm record, could consider addition of hydralazine.  She presents today for follow-up accompanied by her son.  Since her last visit she has been stable from a cardiac standpoint.  She has stable nonpitting bilateral  lower extremity edema.  She gets hot legs at night.  She denies any chest pain, palpitations, dizziness, dyspnea, PND, orthopnea, weight gain.  Overall, she reports feeling well.  No CBC, CMET, TSH, follow-up in 6 months.  Has not had a chest x-ray.  Will discuss with Dr. Jordan. 1. Chronic diastolic heart failure/dyspnea on exertion: Echo in 2021 showed EF 65%, moderate LVH, mild MR, AR, mild aortic valve sclerosis, mild pulmonary hypertension, severe LAE. She notes mild dyspnea on exertion, increased bilateral lower extremity edema. She denies PND, orthopnea, weight  gain.  Will add Lasix  40 mg daily x 3 days followed by Lasix  40 mg daily as needed for swelling, weight gain.  Will check BMET in 1 week.  Continue metoprolol , olmesartan.   2. Persistent atrial fibrillation/PSVT: Maintaining sinus rhythm.  Denies any recent palpitations.  Continue amiodarone , metoprolol , diltiazem , Eliquis .   3. Hypertension:  4. Disposition: For now,     Home Medications    Current Outpatient Medications  Medication Sig Dispense Refill   acetaminophen  (TYLENOL ) 325 MG tablet Take 650 mg by mouth every 6 (six) hours as needed.     amiodarone  (PACERONE ) 200 MG tablet Take 1/2 tablet (100 mg) daily for 6 days, hold on Sunday. 36 tablet 3   BION TEARS PF 0.1-0.3 % SOLN SMARTSIG:1 Drop(s) Right Eye PRN     COSOPT PF 2-0.5 % SOLN ophthalmic solution Place 1 drop into the right eye 2 (two) times daily.     diltiazem  (CARDIZEM  CD) 240 MG 24 hr capsule Take 1 capsule (240 mg total) by mouth daily. 90 capsule 1   ELIQUIS  5 MG TABS tablet TAKE ONE TABLET BY MOUTH TWICE DAILY 180 tablet 1   Ferrous Sulfate (IRON) 325 (65 Fe) MG TABS 1 tablet Orally once a day     furosemide  (LASIX ) 40 MG tablet Take 1 tablet daily for 3 days, then take 1 TAB DAILY (40 mg) daily as needed only for weight gain of 3 lb over night or 5 lb in 1 week. (Patient taking differently: Take 40 mg by mouth every other day. Take 1 tablet daily for 3 days, then take 1 TAB DAILY (40 mg) daily as needed only for weight gain of 3 lb over night or 5 lb in 1 week.) 90 tablet 2   hypromellose (GENTEAL) 0.3 % GEL ophthalmic ointment Place 1 drop into the right eye at bedtime.     metoprolol  succinate (TOPROL -XL) 50 MG 24 hr tablet TAKE ONE TABLET BY MOUTH DAILY 90 tablet 2   Multiple Vitamins-Minerals (ICAPS PO) Take 1 capsule by mouth daily.     olmesartan (BENICAR) 40 MG tablet Take 40 mg by mouth daily.     REFRESH CELLUVISC 1 % GEL Place 1 drop into the right eye at bedtime.     Serum Base LIQD 1 drop.      Tafluprost, PF, 0.0015 % SOLN Place 1 drop into the right eye at bedtime.     No current facility-administered medications for this visit.     Review of Systems    ***.  All other systems reviewed and are otherwise negative except as noted above.    Physical Exam    VS:  BP 138/70 (Cuff Size: Normal)   Pulse 68   Ht 5' 5 (1.651 m)   Wt 159 lb (72.1 kg)   SpO2 97%   BMI 26.46 kg/m   GEN: Well nourished, well developed, in no acute distress. HEENT: normal. Neck: Supple,  no JVD, carotid bruits, or masses. Cardiac: RRR, no murmurs, rubs, or gallops. No clubbing, cyanosis, edema.  Radials/DP/PT 2+ and equal bilaterally.  Respiratory:  Respirations regular and unlabored, clear to auscultation bilaterally. GI: Soft, nontender, nondistended, BS + x 4. MS: no deformity or atrophy. Skin: warm and dry, no rash. Neuro:  Strength and sensation are intact. Psych: Normal affect.  Accessory Clinical Findings    ECG personally reviewed by me today - EKG Interpretation Date/Time:  Tuesday May 05 2024 16:09:10 EST Ventricular Rate:  58 PR Interval:  192 QRS Duration:  96 QT Interval:  456 QTC Calculation: 447 R Axis:   2  Text Interpretation: Sinus bradycardia Minimal voltage criteria for LVH, may be normal variant ( Cornell product ) Abnormal QRS-T angle, consider primary T wave abnormality When compared with ECG of 22-Feb-2023 09:51, No significant change was found Confirmed by Daneen Perkins ([id number]) on 05/05/2024 4:12:08 PM  - no acute changes.   Lab Results  Component Value Date   WBC 7.0 04/09/2022   HGB 10.7 (L) 04/09/2022   HCT 32.7 (L) 04/09/2022   MCV 93.7 04/09/2022   PLT 251 04/09/2022   Lab Results  Component Value Date   CREATININE 0.94 12/17/2022   BUN 30 12/17/2022   NA 140 12/17/2022   K 4.1 12/17/2022   CL 103 12/17/2022   CO2 23 12/17/2022   Lab Results  Component Value Date   ALT 17 10/23/2021   AST 18 10/23/2021   ALKPHOS 82 10/23/2021   BILITOT  0.2 10/23/2021   Lab Results  Component Value Date   CHOL 147 01/25/2014   HDL 47 01/25/2014   LDLCALC 87 01/25/2014   TRIG 63 01/25/2014   CHOLHDL 3.1 01/25/2014    No results found for: HGBA1C  Assessment & Plan    1.  ***      Perkins JAYSON Daneen, NP 05/05/2024, 4:13 PM       [1]  Allergies Allergen Reactions   Shellfish Protein-Containing Drug Products Anaphylaxis   Iodinated Contrast Media Nausea And Vomiting

## 2024-05-06 LAB — BASIC METABOLIC PANEL WITH GFR
BUN/Creatinine Ratio: 31 — ABNORMAL HIGH (ref 12–28)
BUN: 33 mg/dL (ref 10–36)
CO2: 21 mmol/L (ref 20–29)
Calcium: 9.8 mg/dL (ref 8.7–10.3)
Chloride: 106 mmol/L (ref 96–106)
Creatinine, Ser: 1.06 mg/dL — ABNORMAL HIGH (ref 0.57–1.00)
Glucose: 108 mg/dL — ABNORMAL HIGH (ref 70–99)
Potassium: 4.8 mmol/L (ref 3.5–5.2)
Sodium: 140 mmol/L (ref 134–144)
eGFR: 49 mL/min/1.73 — ABNORMAL LOW (ref >59)

## 2024-05-06 LAB — CBC
Hematocrit: 30 % — ABNORMAL LOW (ref 34.0–46.6)
Hemoglobin: 9.7 g/dL — ABNORMAL LOW (ref 11.1–15.9)
MCH: 29.8 pg (ref 26.6–33.0)
MCHC: 32.3 g/dL (ref 31.5–35.7)
MCV: 92 fL (ref 79–97)
Platelets: 274 x10E3/uL (ref 150–450)
RBC: 3.26 x10E6/uL — ABNORMAL LOW (ref 3.77–5.28)
RDW: 12.6 % (ref 11.7–15.4)
WBC: 7.2 x10E3/uL (ref 3.4–10.8)

## 2024-05-06 LAB — TSH: TSH: 0.576 u[IU]/mL (ref 0.450–4.500)

## 2024-05-07 ENCOUNTER — Encounter: Payer: Self-pay | Admitting: Nurse Practitioner

## 2024-05-11 ENCOUNTER — Ambulatory Visit: Payer: Self-pay | Admitting: Nurse Practitioner

## 2024-06-08 ENCOUNTER — Emergency Department (HOSPITAL_COMMUNITY)

## 2024-06-08 ENCOUNTER — Other Ambulatory Visit: Payer: Self-pay

## 2024-06-08 ENCOUNTER — Inpatient Hospital Stay (HOSPITAL_COMMUNITY)
Admission: EM | Admit: 2024-06-08 | Discharge: 2024-06-12 | DRG: 389 | Disposition: A | Discharge status: Hospice | Attending: Internal Medicine | Admitting: Internal Medicine

## 2024-06-08 ENCOUNTER — Encounter (HOSPITAL_COMMUNITY): Payer: Self-pay | Admitting: Internal Medicine

## 2024-06-08 ENCOUNTER — Inpatient Hospital Stay (HOSPITAL_COMMUNITY)

## 2024-06-08 DIAGNOSIS — I5032 Chronic diastolic (congestive) heart failure: Secondary | ICD-10-CM | POA: Diagnosis present

## 2024-06-08 DIAGNOSIS — K56601 Complete intestinal obstruction, unspecified as to cause: Principal | ICD-10-CM

## 2024-06-08 DIAGNOSIS — E7800 Pure hypercholesterolemia, unspecified: Secondary | ICD-10-CM | POA: Diagnosis present

## 2024-06-08 DIAGNOSIS — I480 Paroxysmal atrial fibrillation: Secondary | ICD-10-CM | POA: Diagnosis present

## 2024-06-08 DIAGNOSIS — K6389 Other specified diseases of intestine: Secondary | ICD-10-CM | POA: Insufficient documentation

## 2024-06-08 DIAGNOSIS — Z79899 Other long term (current) drug therapy: Secondary | ICD-10-CM

## 2024-06-08 DIAGNOSIS — H409 Unspecified glaucoma: Secondary | ICD-10-CM | POA: Diagnosis present

## 2024-06-08 DIAGNOSIS — Z7901 Long term (current) use of anticoagulants: Secondary | ICD-10-CM

## 2024-06-08 DIAGNOSIS — I10 Essential (primary) hypertension: Secondary | ICD-10-CM | POA: Diagnosis not present

## 2024-06-08 DIAGNOSIS — D500 Iron deficiency anemia secondary to blood loss (chronic): Secondary | ICD-10-CM | POA: Diagnosis not present

## 2024-06-08 DIAGNOSIS — Z66 Do not resuscitate: Secondary | ICD-10-CM | POA: Diagnosis present

## 2024-06-08 DIAGNOSIS — Z87891 Personal history of nicotine dependence: Secondary | ICD-10-CM

## 2024-06-08 DIAGNOSIS — Z91013 Allergy to seafood: Secondary | ICD-10-CM | POA: Diagnosis not present

## 2024-06-08 DIAGNOSIS — K56609 Unspecified intestinal obstruction, unspecified as to partial versus complete obstruction: Secondary | ICD-10-CM | POA: Diagnosis present

## 2024-06-08 DIAGNOSIS — Z8249 Family history of ischemic heart disease and other diseases of the circulatory system: Secondary | ICD-10-CM

## 2024-06-08 DIAGNOSIS — Z800 Family history of malignant neoplasm of digestive organs: Secondary | ICD-10-CM | POA: Diagnosis not present

## 2024-06-08 DIAGNOSIS — K5669 Other partial intestinal obstruction: Principal | ICD-10-CM | POA: Diagnosis present

## 2024-06-08 DIAGNOSIS — Z96641 Presence of right artificial hip joint: Secondary | ICD-10-CM | POA: Diagnosis present

## 2024-06-08 DIAGNOSIS — H5462 Unqualified visual loss, left eye, normal vision right eye: Secondary | ICD-10-CM | POA: Diagnosis present

## 2024-06-08 DIAGNOSIS — I110 Hypertensive heart disease with heart failure: Secondary | ICD-10-CM | POA: Diagnosis present

## 2024-06-08 DIAGNOSIS — K219 Gastro-esophageal reflux disease without esophagitis: Secondary | ICD-10-CM | POA: Diagnosis present

## 2024-06-08 DIAGNOSIS — Z91041 Radiographic dye allergy status: Secondary | ICD-10-CM

## 2024-06-08 DIAGNOSIS — I4891 Unspecified atrial fibrillation: Secondary | ICD-10-CM | POA: Diagnosis present

## 2024-06-08 DIAGNOSIS — D509 Iron deficiency anemia, unspecified: Secondary | ICD-10-CM | POA: Diagnosis present

## 2024-06-08 LAB — URINALYSIS, ROUTINE W REFLEX MICROSCOPIC
Bilirubin Urine: NEGATIVE
Glucose, UA: NEGATIVE mg/dL
Ketones, ur: 5 mg/dL — AB
Nitrite: NEGATIVE
Protein, ur: NEGATIVE mg/dL
Specific Gravity, Urine: 1.019 (ref 1.005–1.030)
pH: 5 (ref 5.0–8.0)

## 2024-06-08 LAB — COMPREHENSIVE METABOLIC PANEL WITH GFR
ALT: 12 U/L (ref 0–44)
AST: 18 U/L (ref 15–41)
Albumin: 4.2 g/dL (ref 3.5–5.0)
Alkaline Phosphatase: 86 U/L (ref 38–126)
Anion gap: 11 (ref 5–15)
BUN: 23 mg/dL (ref 8–23)
CO2: 24 mmol/L (ref 22–32)
Calcium: 9.3 mg/dL (ref 8.9–10.3)
Chloride: 100 mmol/L (ref 98–111)
Creatinine, Ser: 0.75 mg/dL (ref 0.44–1.00)
GFR, Estimated: >60 mL/min
Glucose, Bld: 147 mg/dL — ABNORMAL HIGH (ref 70–99)
Potassium: 4.3 mmol/L (ref 3.5–5.1)
Sodium: 136 mmol/L (ref 135–145)
Total Bilirubin: 0.4 mg/dL (ref 0.0–1.2)
Total Protein: 7.5 g/dL (ref 6.5–8.1)

## 2024-06-08 LAB — CBC
HCT: 35.4 % — ABNORMAL LOW (ref 36.0–46.0)
Hemoglobin: 11.2 g/dL — ABNORMAL LOW (ref 12.0–15.0)
MCH: 28.9 pg (ref 26.0–34.0)
MCHC: 31.6 g/dL (ref 30.0–36.0)
MCV: 91.5 fL (ref 80.0–100.0)
Platelets: 301 K/uL (ref 150–400)
RBC: 3.87 MIL/uL (ref 3.87–5.11)
RDW: 13.8 % (ref 11.5–15.5)
WBC: 7.3 K/uL (ref 4.0–10.5)
nRBC: 0 % (ref 0.0–0.2)

## 2024-06-08 LAB — LIPASE, BLOOD: Lipase: 12 U/L (ref 11–51)

## 2024-06-08 MED ORDER — TAFLUPROST (PF) 0.0015 % OP SOLN: 1.0000 [drp] | Freq: Every day | OPHTHALMIC | Status: DC

## 2024-06-08 MED ORDER — LIDOCAINE VISCOUS HCL 2 % MT SOLN
15.0000 mL | Freq: Once | OROMUCOSAL | Status: AC
  Administered 2024-06-08: 15 mL via OROMUCOSAL
  Filled 2024-06-08: qty 15

## 2024-06-08 MED ORDER — SENNOSIDES-DOCUSATE SODIUM 8.6-50 MG PO TABS
1.0000 | ORAL_TABLET | Freq: Two times a day (BID) | ORAL | Status: DC
  Administered 2024-06-08 – 2024-06-12 (×8): 1 via ORAL
  Filled 2024-06-08 (×9): qty 1

## 2024-06-08 MED ORDER — ACETAMINOPHEN 650 MG RE SUPP: 650.0000 mg | Freq: Four times a day (QID) | RECTAL | Status: DC | PRN

## 2024-06-08 MED ORDER — ACETAMINOPHEN 325 MG PO TABS: 650.0000 mg | ORAL_TABLET | Freq: Four times a day (QID) | ORAL | Status: DC | PRN

## 2024-06-08 MED ORDER — ALBUTEROL SULFATE (2.5 MG/3ML) 0.083% IN NEBU: 2.5000 mg | INHALATION_SOLUTION | RESPIRATORY_TRACT | Status: DC | PRN

## 2024-06-08 MED ORDER — DORZOLAMIDE HCL-TIMOLOL MAL 2-0.5 % OP SOLN
1.0000 [drp] | Freq: Two times a day (BID) | OPHTHALMIC | Status: DC
  Administered 2024-06-08 – 2024-06-12 (×8): 1 [drp] via OPHTHALMIC
  Filled 2024-06-08: qty 10

## 2024-06-08 MED ORDER — FERROUS SULFATE 325 (65 FE) MG PO TABS
325.0000 mg | ORAL_TABLET | Freq: Every day | ORAL | Status: DC
  Administered 2024-06-09 – 2024-06-12 (×3): 325 mg via ORAL
  Filled 2024-06-08 (×5): qty 1

## 2024-06-08 MED ORDER — DORZOLAMIDE HCL-TIMOLOL MAL PF 2-0.5 % OP SOLN: 1.0000 [drp] | Freq: Two times a day (BID) | OPHTHALMIC | Status: DC

## 2024-06-08 MED ORDER — ONDANSETRON HCL 4 MG/2ML IJ SOLN
4.0000 mg | Freq: Once | INTRAMUSCULAR | Status: AC | PRN
  Administered 2024-06-08: 4 mg via INTRAVENOUS
  Filled 2024-06-08: qty 2

## 2024-06-08 MED ORDER — IRBESARTAN 300 MG PO TABS
300.0000 mg | ORAL_TABLET | Freq: Every day | ORAL | Status: DC
  Administered 2024-06-09 – 2024-06-12 (×4): 300 mg via ORAL
  Filled 2024-06-08 (×5): qty 1

## 2024-06-08 MED ORDER — DIATRIZOATE MEGLUMINE & SODIUM 66-10 % PO SOLN
90.0000 mL | Freq: Once | ORAL | Status: AC
  Administered 2024-06-08: 90 mL via NASOGASTRIC
  Filled 2024-06-08: qty 90

## 2024-06-08 MED ORDER — POLYVINYL ALCOHOL 1.4 % OP SOLN: 1.0000 [drp] | Freq: Every day | OPHTHALMIC | Status: DC | PRN

## 2024-06-08 MED ORDER — HYDROMORPHONE HCL 1 MG/ML IJ SOLN
0.5000 mg | INTRAMUSCULAR | Status: DC | PRN
  Administered 2024-06-08: 0.5 mg via INTRAVENOUS
  Administered 2024-06-08 – 2024-06-11 (×12): 1 mg via INTRAVENOUS
  Filled 2024-06-08 (×13): qty 1

## 2024-06-08 MED ORDER — DILTIAZEM HCL ER COATED BEADS 240 MG PO CP24
240.0000 mg | ORAL_CAPSULE | Freq: Every day | ORAL | Status: DC
  Administered 2024-06-08 – 2024-06-11 (×4): 240 mg via ORAL
  Filled 2024-06-08 (×5): qty 1

## 2024-06-08 MED ORDER — ONDANSETRON HCL 4 MG PO TABS
4.0000 mg | ORAL_TABLET | Freq: Four times a day (QID) | ORAL | Status: DC | PRN
  Administered 2024-06-11: 4 mg via ORAL
  Filled 2024-06-08: qty 1

## 2024-06-08 MED ORDER — AMIODARONE HCL 200 MG PO TABS
100.0000 mg | ORAL_TABLET | Freq: Every day | ORAL | Status: DC
  Administered 2024-06-08 – 2024-06-11 (×4): 100 mg via ORAL
  Filled 2024-06-08 (×6): qty 1

## 2024-06-08 MED ORDER — METOPROLOL SUCCINATE ER 50 MG PO TB24
50.0000 mg | ORAL_TABLET | Freq: Every day | ORAL | Status: DC
  Administered 2024-06-08 – 2024-06-11 (×4): 50 mg via ORAL
  Filled 2024-06-08 (×5): qty 1

## 2024-06-08 MED ORDER — ONDANSETRON HCL 4 MG/2ML IJ SOLN
4.0000 mg | Freq: Four times a day (QID) | INTRAMUSCULAR | Status: DC | PRN
  Administered 2024-06-08 – 2024-06-09 (×2): 4 mg via INTRAVENOUS
  Filled 2024-06-08 (×2): qty 2

## 2024-06-08 MED ORDER — POLYETHYLENE GLYCOL 3350 17 G PO PACK: 17.0000 g | PACK | Freq: Every day | ORAL | Status: DC | PRN

## 2024-06-08 MED ORDER — SODIUM CHLORIDE 0.9 % IV BOLUS
1000.0000 mL | Freq: Once | INTRAVENOUS | Status: AC
  Administered 2024-06-08: 1000 mL via INTRAVENOUS

## 2024-06-08 MED ORDER — APIXABAN 5 MG PO TABS
5.0000 mg | ORAL_TABLET | Freq: Two times a day (BID) | ORAL | Status: DC
  Administered 2024-06-08 – 2024-06-12 (×8): 5 mg via ORAL
  Filled 2024-06-08 (×8): qty 1

## 2024-06-08 MED ORDER — MORPHINE SULFATE (PF) 2 MG/ML IV SOLN
2.0000 mg | Freq: Once | INTRAVENOUS | Status: AC
  Administered 2024-06-08: 2 mg via INTRAVENOUS
  Filled 2024-06-08: qty 1

## 2024-06-08 MED ORDER — SODIUM CHLORIDE 0.9 % IV SOLN: INTRAVENOUS | Status: AC

## 2024-06-08 MED ORDER — LATANOPROST 0.005 % OP SOLN
1.0000 [drp] | Freq: Every day | OPHTHALMIC | Status: DC
  Administered 2024-06-08 – 2024-06-11 (×4): 1 [drp] via OPHTHALMIC
  Filled 2024-06-08: qty 2.5

## 2024-06-08 NOTE — Plan of Care (Signed)

## 2024-06-08 NOTE — Consult Note (Signed)
 [facility] Gastroenterology Consult  Referring Provider: No ref. provider found Primary Care Physician:  Claudene Pellet, MD Primary Gastroenterologist: [facility]. Elicia  Reason for Consultation: Abnormal CT imaging, apple core lesion in the proximal transverse colon  SUBJECTIVE:   HPI: Regina Taylor is a 89 y.o. female with past medical history significant for atrial fibrillation on Eliquis , GERD, iron deficiency anemia, positive FIT testing.  Previously seen by Dr. Elicia in office in August 2023 for evaluation of iron deficiency anemia and heme positive stool.  Long discussion was had with patient and her son.  Possibility of underlying colon cancer given anemia, positive fit test and family history of colon cancer in her mother were discussed.  No endoscopic therapy was pursued given that she denied any overt bleeding, increased risks of endoscopic procedures and that she would not pursue chemotherapy or surgical intervention if colon cancer were found.  Presented to hospital for chief complaint of few day history of nausea, vomiting and bandlike sensation of abdominal pain.  CT imaging has shown apple core lesion in the proximal transverse colon.  She has had NG tube placement per general surgery.  Abdominal pain improved with pain medications.  No chest pain.  Some shortness of breath.  Noted last colonoscopy was age 36-60.  Mother with colon cancer.  Labs showed hemoglobin 11.2, platelet 301, WBC 7.3  Past Medical History:  Diagnosis Date   Arthritis    Blindness of left eye    Fatigue    GERD (gastroesophageal reflux disease)    Glaucoma    Macular degeneration    MVP (mitral valve prolapse)    Palpitations    SVT (supraventricular tachycardia)    Past Surgical History:  Procedure Laterality Date   CARDIOVERSION N/A 08/25/2020   Procedure: CARDIOVERSION;  Surgeon: Kate Lonni CROME, MD;  Location: [facility] ENDOSCOPY;  Service: Cardiovascular;  Laterality: N/A;   CATARAC      CATARACT EXTRACTION     PARTIAL HIP ARTHROPLASTY     RETINAL DETACHMENT SURGERY     TOTAL HIP ARTHROPLASTY Right 1990   Prior to Admission medications  Medication Sig Start Date End Date Taking? Authorizing Provider  acetaminophen  (TYLENOL ) 325 MG tablet Take 650 mg by mouth every 6 (six) hours as needed for mild pain (pain score 1-3).   Yes [provider]  amiodarone  (PACERONE ) 100 MG tablet Take 1 tablet daily and hold on Sundays 05/05/24  Yes Monge, Damien BROCKS, NP  BION TEARS PF 0.1-0.3 % SOLN Place 1 drop into the right eye daily as needed (Eye comfort). 10/02/21  Yes [provider]  COSOPT  PF 2-0.5 % SOLN ophthalmic solution Place 1 drop into the right eye 2 (two) times daily. 10/02/21  Yes [provider]  diltiazem  (CARDIZEM  CD) 240 MG 24 hr capsule Take 1 capsule (240 mg total) by mouth daily. 03/24/24  Yes Jordan, Peter M, MD  ELIQUIS  5 MG TABS tablet TAKE ONE TABLET BY MOUTH TWICE DAILY 03/13/24  Yes Jordan, Peter M, MD  Ferrous Sulfate  (IRON) 325 (65 Fe) MG TABS Take 325 mg by mouth daily.   Yes [provider]  furosemide  (LASIX ) 40 MG tablet Take 1 tablet daily for 3 days, then take 1 TAB DAILY (40 mg) daily as needed only for weight gain of 3 lb over night or 5 lb in 1 week. Patient taking differently: Take 40 mg by mouth every other day. Take 1 tablet daily for 3 days, then take 1 TAB DAILY (40 mg)  daily as needed only for weight gain of 3 lb over night or 5 lb in 1 week. 03/17/24  Yes Jordan, Peter M, MD  metoprolol  succinate (TOPROL -XL) 50 MG 24 hr tablet TAKE ONE TABLET BY MOUTH DAILY 06/27/23  Yes Jordan, Peter M, MD  Multiple Vitamins-Minerals (ICAPS PO) Take 1 capsule by mouth daily.   Yes [provider]  olmesartan (BENICAR) 40 MG tablet Take 40 mg by mouth daily.   Yes [provider]  Tafluprost , PF, 0.0015 % SOLN Place 1 drop into the right eye at bedtime.   Yes [provider]   No current  facility-administered medications for this encounter.   Current Outpatient Medications  Medication Sig Dispense Refill   acetaminophen  (TYLENOL ) 325 MG tablet Take 650 mg by mouth every 6 (six) hours as needed for mild pain (pain score 1-3).     amiodarone  (PACERONE ) 100 MG tablet Take 1 tablet daily and hold on Sundays 90 tablet 3   BION TEARS PF 0.1-0.3 % SOLN Place 1 drop into the right eye daily as needed (Eye comfort).     COSOPT  PF 2-0.5 % SOLN ophthalmic solution Place 1 drop into the right eye 2 (two) times daily.     diltiazem  (CARDIZEM  CD) 240 MG 24 hr capsule Take 1 capsule (240 mg total) by mouth daily. 90 capsule 1   ELIQUIS  5 MG TABS tablet TAKE ONE TABLET BY MOUTH TWICE DAILY 180 tablet 1   Ferrous Sulfate  (IRON) 325 (65 Fe) MG TABS Take 325 mg by mouth daily.     furosemide  (LASIX ) 40 MG tablet Take 1 tablet daily for 3 days, then take 1 TAB DAILY (40 mg) daily as needed only for weight gain of 3 lb over night or 5 lb in 1 week. (Patient taking differently: Take 40 mg by mouth every other day. Take 1 tablet daily for 3 days, then take 1 TAB DAILY (40 mg) daily as needed only for weight gain of 3 lb over night or 5 lb in 1 week.) 90 tablet 2   metoprolol  succinate (TOPROL -XL) 50 MG 24 hr tablet TAKE ONE TABLET BY MOUTH DAILY 90 tablet 2   Multiple Vitamins-Minerals (ICAPS PO) Take 1 capsule by mouth daily.     olmesartan (BENICAR) 40 MG tablet Take 40 mg by mouth daily.     Tafluprost , PF, 0.0015 % SOLN Place 1 drop into the right eye at bedtime.     Allergies as of 06/08/2024 - Review Complete 06/08/2024  Allergen Reaction Noted   Shellfish protein-containing drug products Anaphylaxis 06/13/2020   Iodinated contrast media Nausea And Vomiting 09/11/2010   Family History  Problem Relation Age of Onset   Cancer Mother    Heart disease Father    Heart attack Brother    Social History   Socioeconomic History   Marital status: Divorced    Spouse name: Not on file   Number  of children: 1   Years of education: Not on file   Highest education level: Not on file  Occupational History   Occupation: foreign language professor    Employer: RETIRED    Comment: retired  Tobacco Use   Smoking status: Former    Current packs/day: 0.00    Types: Cigarettes    Quit date: 09/07/1979    Years since quitting: 44.7   Smokeless tobacco: Never  Substance and Sexual Activity   Alcohol  use: Yes    Comment: occaisional wine   Drug use: No  Sexual activity: Not on file  Other Topics Concern   Not on file  Social History Narrative   Not on file   Social Drivers of Health   Tobacco Use: Medium Risk (05/07/2024)   Patient History    Smoking Tobacco Use: Former    Smokeless Tobacco Use: Never    Passive Exposure: Not on Actuary Strain: Not on file  Food Insecurity: Not on file  Transportation Needs: Not on file  Physical Activity: Not on file  Stress: Not on file  Social Connections: Not on file  Intimate Partner Violence: Not on file  Depression (EYV7-0): Not on file  Alcohol  Screen: Not on file  Housing: Not on file  Utilities: Not on file  Health Literacy: Not on file   Review of Systems:  Review of Systems  Respiratory:  Positive for shortness of breath.   Cardiovascular:  Negative for chest pain.  Gastrointestinal:  Positive for abdominal pain, nausea and vomiting.    OBJECTIVE:   Temp:  [97.7 F (36.5 C)] 97.7 F (36.5 C) (01/19 0846) Pulse Rate:  [69] 69 (01/19 0846) Resp:  [20] 20 (01/19 0846) BP: (146-151)/(56-124) 151/56 (01/19 0848) SpO2:  [100 %] 100 % (01/19 0846)   Physical Exam Constitutional:      General: She is not in acute distress.    Appearance: She is not ill-appearing, toxic-appearing or diaphoretic.  HENT:     Nose:     Comments: NG tube in place Cardiovascular:     Rate and Rhythm: Normal rate and regular rhythm.  Pulmonary:     Effort: No respiratory distress.     Breath sounds: Normal breath  sounds.  Abdominal:     General: Bowel sounds are normal. There is no distension.     Palpations: Abdomen is soft.     Tenderness: There is no abdominal tenderness. There is no guarding.  Skin:    General: Skin is warm and dry.     Coloration: Skin is pale.  Neurological:     Mental Status: She is alert.     Labs: Recent Labs    06/08/24 0940  WBC 7.3  HGB 11.2*  HCT 35.4*  PLT 301   BMET Recent Labs    06/08/24 0940  NA 136  K 4.3  CL 100  CO2 24  GLUCOSE 147*  BUN 23  CREATININE 0.75  CALCIUM  9.3   LFT Recent Labs    06/08/24 0940  PROT 7.5  ALBUMIN 4.2  AST 18  ALT 12  ALKPHOS 86  BILITOT 0.4   PT/INR No results for input(s): LABPROT, INR in the last 72 hours.  Diagnostic imaging: DG Abd Portable 1 View Result Date: 06/08/2024 CLINICAL DATA:  Nasogastric tube placement. EXAM: PORTABLE ABDOMEN - 1 VIEW COMPARISON:  None. FINDINGS: Nasogastric tube terminates in the gastric antrum. Unremarkable bowel gas pattern. IMPRESSION: Nasogastric tube terminates in the gastric antrum. Electronically Signed   By: Newell Eke M.D.   On: 06/08/2024 11:16   CT ABDOMEN PELVIS WO CONTRAST Result Date: 06/08/2024 CLINICAL DATA:  Nausea, vomiting and poor appetite. Bowel obstruction suspected. EXAM: CT ABDOMEN AND PELVIS WITHOUT CONTRAST TECHNIQUE: Multidetector CT imaging of the abdomen and pelvis was performed following the standard protocol without IV contrast. RADIATION DOSE REDUCTION: This exam was performed according to the departmental dose-optimization program which includes automated exposure control, adjustment of the mA and/or kV according to patient size and/or use of iterative reconstruction technique. COMPARISON:  Abdominopelvic CT  12/04/2021. FINDINGS: Lower chest: Scattered small nodules at both lung bases are stable from previous CT, consistent with benign findings. No significant pleural effusion. Stable small hiatal hernia. Hepatobiliary: The liver has a  non cirrhotic morphology without suspicious focal abnormality on noncontrast imaging. There are calcified gallstones without evidence of gallbladder wall thickening, surrounding inflammation or biliary ductal dilatation. Pancreas: Unremarkable. No pancreatic ductal dilatation or surrounding inflammatory changes. Spleen: Normal in size without focal abnormality. Adrenals/Urinary Tract: Both adrenal glands appear normal. No evidence of urinary tract calculus, hydronephrosis or perinephric soft tissue stranding. Simple appearing cyst in the lower pole of the left kidney measuring 3.2 cm, not well seen previously secondary to motion artifact; no specific follow-up imaging recommended. The bladder appears unremarkable for its degree of distention although is partially obscured by artifact from the right hip arthroplasty. Stomach/Bowel: Possible minimal enteric contrast within the stomach and proximal small bowel. The stomach is decompressed. There are multiple mildly dilated and fluid-filled loops of mid to distal small bowel. In addition, the proximal colon is distended and fluid-filled. There is concern of an obstructing apple-core lesion in the proximal transverse colon measuring up to 4.5 cm in length on coronal image 62/5. This appearance is highly concerning for colon cancer. The distal colon appears decompressed. There are scattered mild diverticular changes within the descending and sigmoid colon. Vascular/Lymphatic: Mild soft tissue stranding around the suspected transverse colon mass. No enlarged abdominopelvic lymph nodes are identified. Aortic and branch vessel atherosclerosis without evidence of aneurysm. Reproductive: The uterus and ovaries appear unremarkable. No adnexal mass. Other: No ascites or peritoneal nodularity. No evidence of abdominal wall hernia. Musculoskeletal: No acute osseous findings. Status post right total hip arthroplasty with stable 2.5 cm fluid collection along the right iliopsoas  tendon (image 64/2), likely bursitis. Progressive severe left hip arthropathy with partial collapse of the left femoral head. Relatively mild spondylosis with a convex right scoliosis. IMPRESSION: 1. Findings are highly concerning for colon cancer in the proximal transverse colon with associated colonic obstruction. Recommend further evaluation with colonoscopy. 2. No evidence of metastatic disease. 3. Cholelithiasis without evidence of cholecystitis or biliary ductal dilatation. 4. Progressive severe left hip arthropathy with partial collapse of the left femoral head. 5.  Aortic Atherosclerosis (ICD10-I70.0). 6. These results were called by telephone at the time of interpretation on 06/08/2024 at 9:54 am to provider [facility] , who verbally acknowledged these results. Electronically Signed   By: Elsie Perone M.D.   On: 06/08/2024 09:54   IMPRESSION: Abnormal CT imaging, apple core lesion in proximal transverse colon  Nausea and vomiting Abdominal pain  Atrial fibrillation on Eliquis  History iron deficiency anemia History FOBT+ Family history colon cancer in mother   PLAN: - At length discussion had with patient at bedside, informed her of concern for colon cancer based on CT imaging, she is not interested in diagnostic colonoscopy as she is also not interested in chemotherapy or surgical intervention, this was discussed with patient's son, Viktoria, who was at bedside - General Surgery also present at time of evaluation, appreciate recommendations - Agree with Palliative Care consultation  - Recommend bowel regimen to maintain bowel movements as lesion could become more obstructive in future  - [facility] will be available as needed   LOS: 0 days   Estefana Keas, DO [facility]

## 2024-06-08 NOTE — H&P (Signed)
 " History and Physical  Regina Taylor FMW:[id number] DOB: [birth year] DOA: 06/08/2024  PCP: Regina Pellet, MD   Chief Complaint: Abdominal discomfort, nausea  HPI: Regina Taylor is a 89 y.o. female with medical history significant for glaucoma, macular degeneration, atrial fibrillation on Eliquis  being admitted to the hospital with concern for obstructing colon cancer.  She has a family history of colon cancer in her mother, has a history of iron deficiency causing anemia, has been followed by [facility] and has declined further workup of stable GI bleeding in the past.  Her last colonoscopy was several decades ago.  History is provided mainly by the patient, corroborated by her son Regina Taylor who is at the bedside.  She has had intermittent visible GI bleeding but hemoglobin has remained stable, in discussion with GI and cardiology, she declined any further colonoscopies or other investigation, decided to stay on Eliquis  for stroke risk.  She was in her usual state of health, continues to live somewhat independently with the help of her son, until about 2 days ago when she started having some vague abdominal discomfort, some mild nausea yesterday, and had an episode of vomiting this morning.  She has not had any visible blood in her stool.  Workup in the ER as noted below shows evidence of partial obstruction of the colon, likely due to colon cancer.  Patient had extensive discussion with GI and general surgery in the emergency department, does not wish to have any further workup or evaluation, has NG tube in place and is comfortable currently.  Review of Systems: Please see HPI for pertinent positives and negatives. A complete 10 system review of systems are otherwise negative.  Past Medical History:  Diagnosis Date   Arthritis    Blindness of left eye    Fatigue    GERD (gastroesophageal reflux disease)    Glaucoma    Macular degeneration    MVP (mitral valve prolapse)    Palpitations    SVT  (supraventricular tachycardia)    Past Surgical History:  Procedure Laterality Date   CARDIOVERSION N/A 08/25/2020   Procedure: CARDIOVERSION;  Surgeon: Kate Lonni CROME, MD;  Location: [facility] ENDOSCOPY;  Service: Cardiovascular;  Laterality: N/A;   CATARAC     CATARACT EXTRACTION     PARTIAL HIP ARTHROPLASTY     RETINAL DETACHMENT SURGERY     TOTAL HIP ARTHROPLASTY Right 1990   Social History:  reports that she quit smoking about 44 years ago. Her smoking use included cigarettes. She has never used smokeless tobacco. She reports current alcohol  use. She reports that she does not use drugs.  Allergies[1]  Family History  Problem Relation Age of Onset   Cancer Mother    Heart disease Father    Heart attack Brother      Prior to Admission medications  Medication Sig Start Date End Date Taking? Authorizing Provider  acetaminophen  (TYLENOL ) 325 MG tablet Take 650 mg by mouth every 6 (six) hours as needed for mild pain (pain score 1-3).   Yes [provider]  amiodarone  (PACERONE ) 100 MG tablet Take 1 tablet daily and hold on Sundays 05/05/24  Yes Monge, Damien BROCKS, NP  BION TEARS PF 0.1-0.3 % SOLN Place 1 drop into the right eye daily as needed (Eye comfort). 10/02/21  Yes [provider]  COSOPT  PF 2-0.5 % SOLN ophthalmic solution Place 1 drop into the right eye 2 (two) times daily. 10/02/21  Yes [provider]  diltiazem  (CARDIZEM  CD) 240  MG 24 hr capsule Take 1 capsule (240 mg total) by mouth daily. 03/24/24  Yes Jordan, Peter M, MD  ELIQUIS  5 MG TABS tablet TAKE ONE TABLET BY MOUTH TWICE DAILY 03/13/24  Yes Jordan, Peter M, MD  Ferrous Sulfate  (IRON) 325 (65 Fe) MG TABS Take 325 mg by mouth daily.   Yes [provider]  furosemide  (LASIX ) 40 MG tablet Take 1 tablet daily for 3 days, then take 1 TAB DAILY (40 mg) daily as needed only for weight gain of 3 lb over night or 5 lb in 1 week. Patient taking differently: Take 40 mg by mouth every other day.  Take 1 tablet daily for 3 days, then take 1 TAB DAILY (40 mg) daily as needed only for weight gain of 3 lb over night or 5 lb in 1 week. 03/17/24  Yes Jordan, Peter M, MD  metoprolol  succinate (TOPROL -XL) 50 MG 24 hr tablet TAKE ONE TABLET BY MOUTH DAILY 06/27/23  Yes Jordan, Peter M, MD  Multiple Vitamins-Minerals (ICAPS PO) Take 1 capsule by mouth daily.   Yes [provider]  olmesartan (BENICAR) 40 MG tablet Take 40 mg by mouth daily.   Yes [provider]  Tafluprost , PF, 0.0015 % SOLN Place 1 drop into the right eye at bedtime.   Yes [provider]    Physical Exam: BP (!) 151/56 (BP Location: Left Arm)   Pulse 69   Temp 97.7 F (36.5 C) (Oral)   Resp 20   SpO2 100%  General:  Alert, oriented, calm, in no acute distress, her son is at the bedside.  NG tube in place.  She looks very comfortable. Cardiovascular: RRR, no murmurs or rubs, no peripheral edema  Respiratory: clear to auscultation bilaterally, no wheezes, no crackles  Abdomen: soft, nontender, nondistended Musculoskeletal: no joint effusions, normal range of motion  Psychiatric: appropriate affect, normal speech  Neurologic: extraocular muscles intact, clear speech, moving all extremities with intact sensorium         Labs on Admission:  Basic Metabolic Panel: Recent Labs  Lab 06/08/24 0940  NA 136  K 4.3  CL 100  CO2 24  GLUCOSE 147*  BUN 23  CREATININE 0.75  CALCIUM  9.3   Liver Function Tests: Recent Labs  Lab 06/08/24 0940  AST 18  ALT 12  ALKPHOS 86  BILITOT 0.4  PROT 7.5  ALBUMIN 4.2   Recent Labs  Lab 06/08/24 0940  LIPASE 12   No results for input(s): AMMONIA in the last 168 hours. CBC: Recent Labs  Lab 06/08/24 0940  WBC 7.3  HGB 11.2*  HCT 35.4*  MCV 91.5  PLT 301   Cardiac Enzymes: No results for input(s): CKTOTAL, CKMB, CKMBINDEX, TROPONINI in the last 168 hours. BNP (last 3 results) No results for input(s): BNP in the last 8760  hours.  ProBNP (last 3 results) No results for input(s): PROBNP in the last 8760 hours.  CBG: No results for input(s): GLUCAP in the last 168 hours.  Radiological Exams on Admission: DG Abd Portable 1 View Result Date: 06/08/2024 CLINICAL DATA:  Nasogastric tube placement. EXAM: PORTABLE ABDOMEN - 1 VIEW COMPARISON:  None. FINDINGS: Nasogastric tube terminates in the gastric antrum. Unremarkable bowel gas pattern. IMPRESSION: Nasogastric tube terminates in the gastric antrum. Electronically Signed   By: Newell Eke M.D.   On: 06/08/2024 11:16   CT ABDOMEN PELVIS WO CONTRAST Result Date: 06/08/2024 CLINICAL DATA:  Nausea, vomiting and poor appetite. Bowel obstruction suspected. EXAM: CT  ABDOMEN AND PELVIS WITHOUT CONTRAST TECHNIQUE: Multidetector CT imaging of the abdomen and pelvis was performed following the standard protocol without IV contrast. RADIATION DOSE REDUCTION: This exam was performed according to the departmental dose-optimization program which includes automated exposure control, adjustment of the mA and/or kV according to patient size and/or use of iterative reconstruction technique. COMPARISON:  Abdominopelvic CT 12/04/2021. FINDINGS: Lower chest: Scattered small nodules at both lung bases are stable from previous CT, consistent with benign findings. No significant pleural effusion. Stable small hiatal hernia. Hepatobiliary: The liver has a non cirrhotic morphology without suspicious focal abnormality on noncontrast imaging. There are calcified gallstones without evidence of gallbladder wall thickening, surrounding inflammation or biliary ductal dilatation. Pancreas: Unremarkable. No pancreatic ductal dilatation or surrounding inflammatory changes. Spleen: Normal in size without focal abnormality. Adrenals/Urinary Tract: Both adrenal glands appear normal. No evidence of urinary tract calculus, hydronephrosis or perinephric soft tissue stranding. Simple appearing cyst in the lower  pole of the left kidney measuring 3.2 cm, not well seen previously secondary to motion artifact; no specific follow-up imaging recommended. The bladder appears unremarkable for its degree of distention although is partially obscured by artifact from the right hip arthroplasty. Stomach/Bowel: Possible minimal enteric contrast within the stomach and proximal small bowel. The stomach is decompressed. There are multiple mildly dilated and fluid-filled loops of mid to distal small bowel. In addition, the proximal colon is distended and fluid-filled. There is concern of an obstructing apple-core lesion in the proximal transverse colon measuring up to 4.5 cm in length on coronal image 62/5. This appearance is highly concerning for colon cancer. The distal colon appears decompressed. There are scattered mild diverticular changes within the descending and sigmoid colon. Vascular/Lymphatic: Mild soft tissue stranding around the suspected transverse colon mass. No enlarged abdominopelvic lymph nodes are identified. Aortic and branch vessel atherosclerosis without evidence of aneurysm. Reproductive: The uterus and ovaries appear unremarkable. No adnexal mass. Other: No ascites or peritoneal nodularity. No evidence of abdominal wall hernia. Musculoskeletal: No acute osseous findings. Status post right total hip arthroplasty with stable 2.5 cm fluid collection along the right iliopsoas tendon (image 64/2), likely bursitis. Progressive severe left hip arthropathy with partial collapse of the left femoral head. Relatively mild spondylosis with a convex right scoliosis. IMPRESSION: 1. Findings are highly concerning for colon cancer in the proximal transverse colon with associated colonic obstruction. Recommend further evaluation with colonoscopy. 2. No evidence of metastatic disease. 3. Cholelithiasis without evidence of cholecystitis or biliary ductal dilatation. 4. Progressive severe left hip arthropathy with partial collapse of  the left femoral head. 5.  Aortic Atherosclerosis (ICD10-I70.0). 6. These results were called by telephone at the time of interpretation on 06/08/2024 at 9:54 am to provider [facility] , who verbally acknowledged these results. Electronically Signed   By: Elsie Perone M.D.   On: 06/08/2024 09:54   Assessment/Plan SYDNA BRODOWSKI is a 89 y.o. female with medical history significant for glaucoma, macular degeneration, atrial fibrillation on Eliquis  being admitted to the hospital with concern for obstructing colon cancer.   Obstructing colon mass-given history of GI bleeding, family history of colon cancer, and findings on CT scan as above.  Patient does not want any further workup with colonoscopy, biopsy, etc.  Prefers to focus on symptom management and return home as soon as possible. -Inpatient admission -Pain and nausea control as needed, continue bowel regimen to keep stools soft -Continue NG tube, surgery management much appreciated -Palliative care consultation  Paroxysmal atrial fibrillation-continue amiodarone , Cardizem ,  Toprol -XL and Eliquis  since no procedures planned  Chronic iron deficiency anemia-appears to be stable, continue iron supplementation  Essential hypertension-continue home ARB, Toprol -XL, Cardizem   Glaucoma-continue home tafluprost , dorzolamide -timolol   DVT prophylaxis: On Eliquis     Code Status: Limited: Do not attempt resuscitation (DNR) -DNR-LIMITED -Do Not Intubate/DNI , discussed extensively with the patient and her son at the bedside at the time of admission.  Consults called: GI, surgery, palliative care  Admission status: The appropriate patient status for this patient is INPATIENT. Inpatient status is judged to be reasonable and necessary in order to provide the required intensity of service to ensure the patient's safety. The patient's presenting symptoms, physical exam findings, and initial radiographic and laboratory data in the context of their  chronic comorbidities is felt to place them at high risk for further clinical deterioration. Furthermore, it is not anticipated that the patient will be medically stable for discharge from the hospital within 2 midnights of admission.    I certify that at the point of admission it is my clinical judgment that the patient will require inpatient hospital care spanning beyond 2 midnights from the point of admission due to high intensity of service, high risk for further deterioration and high frequency of surveillance required  Time spent: 53 minutes  Herschel Fleagle CHRISTELLA Gail MD [facility] Pager [phone]  If 7PM-7AM, please contact night-coverage [email or web address] Password [email or web address]  06/08/2024, 12:23 PM      [1]  Allergies Allergen Reactions   Shellfish Protein-Containing Drug Products Anaphylaxis   Iodinated Contrast Media Nausea And Vomiting   "

## 2024-06-08 NOTE — ED Triage Notes (Signed)
 PT arrives to triage via EMS from home. Pt has complaints of nausea, vomiting, and poor appetite. PT vomiting on arrival to triage.

## 2024-06-08 NOTE — Consult Note (Signed)
 Surgical Evaluation Requesting provider: Larnell Gravely MD  Chief Complaint: abdominal pain  HPI: Patient is a very pleasant 89 year old woman with a history of atrial fibrillation on anticoagulation, mitral valve prolapse, macular degeneration arthritis and GERD who presents with abdominal pain.  Until 2 days ago, she was feeling well and at her baseline, with no digestive complaints whatsoever.  About 2 days ago however she had a hard time finishing her breakfast and had a diminished appetite throughout the day although she did try and make herself eat.  She has continued to have regular bowel movements as recently as yesterday.  Developed epigastric/periumbilical pain with some nausea and vomiting this morning which prompted her to come to the emergency department.  She has had no previous abdominal surgeries.  She does note that a few years ago she had blood in her stool; this resolved spontaneously and she did meet with a gastroenterologist but endoscopic workup was deferred given her age and thus poor candidacy for any surgical intervention.  She is not terribly active secondary to pain from arthritis mostly in her hips.  She does live at home alone who has an aide that comes to help her 3 times a day.  Her son is with her in the emergency department.  She is already feeling better after NG tube was placed, although output has been very minimal.  I reviewed her CT images and report personally.  Allergies[1]  Past Medical History:  Diagnosis Date   Arthritis    Blindness of left eye    Fatigue    GERD (gastroesophageal reflux disease)    Glaucoma    Macular degeneration    MVP (mitral valve prolapse)    Palpitations    SVT (supraventricular tachycardia)     Past Surgical History:  Procedure Laterality Date   CARDIOVERSION N/A 08/25/2020   Procedure: CARDIOVERSION;  Surgeon: Kate Lonni CROME, MD;  Location: [facility] ENDOSCOPY;  Service: Cardiovascular;  Laterality: N/A;   CATARAC      CATARACT EXTRACTION     PARTIAL HIP ARTHROPLASTY     RETINAL DETACHMENT SURGERY     TOTAL HIP ARTHROPLASTY Right 1990    Family History  Problem Relation Age of Onset   Cancer Mother    Heart disease Father    Heart attack Brother     Social History   Socioeconomic History   Marital status: Divorced    Spouse name: Not on file   Number of children: 1   Years of education: Not on file   Highest education level: Not on file  Occupational History   Occupation: foreign language professor    Employer: RETIRED    Comment: retired  Tobacco Use   Smoking status: Former    Current packs/day: 0.00    Types: Cigarettes    Quit date: 09/07/1979    Years since quitting: 44.7   Smokeless tobacco: Never  Substance and Sexual Activity   Alcohol  use: Yes    Comment: occaisional wine   Drug use: No   Sexual activity: Not on file  Other Topics Concern   Not on file  Social History Narrative   Not on file   Social Drivers of Health   Tobacco Use: Medium Risk (05/07/2024)   Patient History    Smoking Tobacco Use: Former    Smokeless Tobacco Use: Never    Passive Exposure: Not on Actuary Strain: Not on file  Food Insecurity: Not on file  Transportation Needs: Not on file  Physical Activity: Not on file  Stress: Not on file  Social Connections: Not on file  Depression (EYV7-0): Not on file  Alcohol  Screen: Not on file  Housing: Not on file  Utilities: Not on file  Health Literacy: Not on file    Medications Ordered Prior to Encounter[2]  Review of Systems: a complete, 10pt review of systems was completed with pertinent positives and negatives as documented in the HPI  Physical Exam: Vitals:   06/08/24 0846 06/08/24 0848  BP: (!) 146/124 (!) 151/56  Pulse: 69   Resp: 20   Temp: 97.7 F (36.5 C)   SpO2: 100%    Gen: A&Ox3, no distress  Chest: respiratory effort is normal.  Cardiovascular: RRR  Gastrointestinal: soft, minimally distended,  nontender.  NG tube in place with small volume thin golden output Psych: appropriate mood and affect, normal insight/judgment intact  Skin: warm and dry      Latest Ref Rng & Units 06/08/2024    9:40 AM 05/05/2024    5:00 PM 04/09/2022    1:00 PM  CBC  WBC 4.0 - 10.5 K/uL 7.3  7.2  7.0   Hemoglobin 12.0 - 15.0 g/dL 88.7  9.7  89.2   Hematocrit 36.0 - 46.0 % 35.4  30.0  32.7   Platelets 150 - 400 K/uL 301  274  251        Latest Ref Rng & Units 06/08/2024    9:40 AM 05/05/2024    5:00 PM 12/17/2022    2:00 PM  CMP  Glucose 70 - 99 mg/dL 852  891  867   BUN 8 - 23 mg/dL 23  33  30   Creatinine 0.44 - 1.00 mg/dL 9.24  8.93  9.05   Sodium 135 - 145 mmol/L 136  140  140   Potassium 3.5 - 5.1 mmol/L 4.3  4.8  4.1   Chloride 98 - 111 mmol/L 100  106  103   CO2 22 - 32 mmol/L 24  21  23    Calcium  8.9 - 10.3 mg/dL 9.3  9.8  9.6   Total Protein 6.5 - 8.1 g/dL 7.5     Total Bilirubin 0.0 - 1.2 mg/dL 0.4     Alkaline Phos 38 - 126 U/L 86     AST 15 - 41 U/L 18     ALT 0 - 44 U/L 12       No results found for: INR, PROTIME  Imaging: DG Abd Portable 1 View Result Date: 06/08/2024 CLINICAL DATA:  Nasogastric tube placement. EXAM: PORTABLE ABDOMEN - 1 VIEW COMPARISON:  None. FINDINGS: Nasogastric tube terminates in the gastric antrum. Unremarkable bowel gas pattern. IMPRESSION: Nasogastric tube terminates in the gastric antrum. Electronically Signed   By: Newell Eke M.D.   On: 06/08/2024 11:16   CT ABDOMEN PELVIS WO CONTRAST Result Date: 06/08/2024 CLINICAL DATA:  Nausea, vomiting and poor appetite. Bowel obstruction suspected. EXAM: CT ABDOMEN AND PELVIS WITHOUT CONTRAST TECHNIQUE: Multidetector CT imaging of the abdomen and pelvis was performed following the standard protocol without IV contrast. RADIATION DOSE REDUCTION: This exam was performed according to the departmental dose-optimization program which includes automated exposure control, adjustment of the mA and/or kV  according to patient size and/or use of iterative reconstruction technique. COMPARISON:  Abdominopelvic CT 12/04/2021. FINDINGS: Lower chest: Scattered small nodules at both lung bases are stable from previous CT, consistent with benign findings. No significant pleural effusion. Stable small hiatal hernia. Hepatobiliary: The liver has a non  cirrhotic morphology without suspicious focal abnormality on noncontrast imaging. There are calcified gallstones without evidence of gallbladder wall thickening, surrounding inflammation or biliary ductal dilatation. Pancreas: Unremarkable. No pancreatic ductal dilatation or surrounding inflammatory changes. Spleen: Normal in size without focal abnormality. Adrenals/Urinary Tract: Both adrenal glands appear normal. No evidence of urinary tract calculus, hydronephrosis or perinephric soft tissue stranding. Simple appearing cyst in the lower pole of the left kidney measuring 3.2 cm, not well seen previously secondary to motion artifact; no specific follow-up imaging recommended. The bladder appears unremarkable for its degree of distention although is partially obscured by artifact from the right hip arthroplasty. Stomach/Bowel: Possible minimal enteric contrast within the stomach and proximal small bowel. The stomach is decompressed. There are multiple mildly dilated and fluid-filled loops of mid to distal small bowel. In addition, the proximal colon is distended and fluid-filled. There is concern of an obstructing apple-core lesion in the proximal transverse colon measuring up to 4.5 cm in length on coronal image 62/5. This appearance is highly concerning for colon cancer. The distal colon appears decompressed. There are scattered mild diverticular changes within the descending and sigmoid colon. Vascular/Lymphatic: Mild soft tissue stranding around the suspected transverse colon mass. No enlarged abdominopelvic lymph nodes are identified. Aortic and branch vessel atherosclerosis  without evidence of aneurysm. Reproductive: The uterus and ovaries appear unremarkable. No adnexal mass. Other: No ascites or peritoneal nodularity. No evidence of abdominal wall hernia. Musculoskeletal: No acute osseous findings. Status post right total hip arthroplasty with stable 2.5 cm fluid collection along the right iliopsoas tendon (image 64/2), likely bursitis. Progressive severe left hip arthropathy with partial collapse of the left femoral head. Relatively mild spondylosis with a convex right scoliosis. IMPRESSION: 1. Findings are highly concerning for colon cancer in the proximal transverse colon with associated colonic obstruction. Recommend further evaluation with colonoscopy. 2. No evidence of metastatic disease. 3. Cholelithiasis without evidence of cholecystitis or biliary ductal dilatation. 4. Progressive severe left hip arthropathy with partial collapse of the left femoral head. 5.  Aortic Atherosclerosis (ICD10-I70.0). 6. These results were called by telephone at the time of interpretation on 06/08/2024 at 9:54 am to provider [facility] , who verbally acknowledged these results. Electronically Signed   By: Elsie Perone M.D.   On: 06/08/2024 09:54     A/P: 89 year old woman presents with 2 days of abdominal pain, decreased appetite nausea with 1 episode of emesis and on CT concerning for malignancy with partial colonic obstruction at the level of the proximal transverse colon.  I had a discussion with the patient and her son today, the patient states that she has essentially decided she would not want surgery and therefore will likely not proceed with colonoscopy for further workup.  We discussed option for palliative care and I would recommend SBO protocol to ensure passage of contrast followed by slowly advancing diet to a low fiber/soft diet with IV liquid component along with stool softeners.  I did recommend that she and her son formulate a plan for what she wants done if the  obstruction progresses or she develops any perforation or other sequelae.     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   Chronic diastolic heart failure (HCC) 12/07/2021   Hypertension 12/07/2021   Aortic atherosclerosis 12/07/2021   Pain of lumbar spine 07/17/2021   Pain of left hip joint 12/01/2020   Persistent atrial fibrillation (HCC)    Atrial fibrillation with rapid ventricular response (HCC) 03/17/2020   Blurred vision 01/27/2014   Syncope 01/25/2014   Glaucoma    Fatigue    Arthritis    DOE (dyspnea on exertion)    SVT (supraventricular tachycardia)    MVP (mitral valve prolapse)    Blindness of left eye        Mitzie Freund, MD [facility]  See AMION to contact appropriate on-call provider      [1]  Allergies Allergen Reactions   Shellfish Protein-Containing Drug Products Anaphylaxis   Iodinated Contrast Media Nausea And Vomiting  [2]  No current facility-administered medications on file prior to encounter.   Current Outpatient Medications on File Prior to Encounter  Medication Sig Dispense Refill   acetaminophen  (TYLENOL ) 325 MG tablet Take 650 mg by mouth every 6 (six) hours as needed for mild pain (pain score 1-3).     amiodarone  (PACERONE ) 100 MG tablet Take 1 tablet daily and hold on Sundays 90 tablet 3   BION TEARS PF 0.1-0.3 % SOLN Place 1 drop into the right eye daily as needed (Eye comfort).     COSOPT  PF 2-0.5 % SOLN ophthalmic solution Place 1 drop into the  right eye 2 (two) times daily.     diltiazem  (CARDIZEM  CD) 240 MG 24 hr capsule Take 1 capsule (240 mg total) by mouth daily. 90 capsule 1   ELIQUIS  5 MG TABS tablet TAKE ONE TABLET BY MOUTH TWICE DAILY 180 tablet 1   Ferrous Sulfate  (IRON) 325 (65 Fe) MG TABS Take 325 mg by mouth daily.     furosemide  (LASIX ) 40 MG tablet Take 1 tablet daily for 3 days, then take 1 TAB DAILY (40 mg) daily as needed only for weight gain of 3 lb over night or 5 lb in 1 week. (Patient taking differently: Take 40 mg by mouth every other day. Take 1 tablet daily for 3 days, then take 1 TAB DAILY (40 mg) daily as needed only for weight gain of 3 lb over night or 5 lb in 1 week.) 90 tablet 2   metoprolol  succinate (TOPROL -XL) 50 MG 24 hr tablet TAKE ONE TABLET BY MOUTH DAILY 90 tablet 2   Multiple Vitamins-Minerals (ICAPS PO) Take 1 capsule by mouth daily.     olmesartan (BENICAR) 40 MG tablet Take 40 mg by mouth daily.     Tafluprost , PF, 0.0015 % SOLN Place 1 drop into the right eye at bedtime.

## 2024-06-08 NOTE — ED Provider Notes (Signed)
 " [place] [facility] DE[facility] AT [facility] Provider Note  CSN: [id number] Arrival date & time: 06/08/24 9167  Chief Complaint(s) Nausea and Emesis  HPI ALIANI CACCAVALE is a sprightly 89 y.o. female who is here today for 24 hours of nausea vomiting decreased oral appetite.  Patient lives alone at home, has an aide who comes in 3 times per day.  She has a history of atrial fibrillation and is on Eliquis .  She takes amiodarone  and diltiazem .  Son is here at bedside.  She reports that starting yesterday she started to have some crampy abdominal pain.  She last had a bowel movement yesterday.  She has had a few episodes of vomiting.  No history of intra-abdominal surgery.   Past Medical History Past Medical History:  Diagnosis Date   Arthritis    Blindness of left eye    Fatigue    GERD (gastroesophageal reflux disease)    Glaucoma    Macular degeneration    MVP (mitral valve prolapse)    Palpitations    SVT (supraventricular tachycardia)    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   Dry eye syndrome of right eye 02/12/2022   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 Authorizing Provider  acetaminophen  (TYLENOL ) 325 MG tablet Take 650 mg by mouth every 6 (six) hours as needed.    [provider]  amiodarone  (PACERONE ) 100 MG tablet Take 1 tablet daily and hold on Sundays 05/05/24   Daneen Damien BROCKS, NP  BION TEARS PF 0.1-0.3 % SOLN SMARTSIG:1 Drop(s) Right Eye PRN 10/02/21   [provider]  COSOPT  PF 2-0.5 % SOLN ophthalmic solution Place 1 drop into the right eye 2 (two) times daily. 10/02/21   [provider]  diltiazem  (CARDIZEM  CD) 240 MG 24 hr capsule Take 1 capsule (240 mg total) by mouth daily. 03/24/24   Jordan, Peter M, MD  ELIQUIS  5 MG TABS tablet TAKE ONE TABLET BY MOUTH TWICE DAILY 03/13/24   Jordan, Peter M, MD  Ferrous Sulfate  (IRON) 325 (65 Fe) MG TABS 1 tablet Orally once a day    [provider]  furosemide  (LASIX ) 40 MG tablet Take 1 tablet daily for 3 days, then take 1 TAB DAILY (40 mg) daily as needed only for weight gain of 3 lb over night or 5 lb in 1 week. Patient taking differently: Take 40 mg by mouth every other day. Take 1 tablet daily for 3 days, then take  1 TAB DAILY (40 mg) daily as needed only for weight gain of 3 lb over night or 5 lb in 1 week. 03/17/24   Jordan, Peter M, MD  hypromellose (GENTEAL) 0.3 % GEL ophthalmic ointment Place 1 drop into the right eye at bedtime.    [provider]  metoprolol  succinate (TOPROL -XL) 50 MG 24 hr tablet TAKE ONE TABLET BY MOUTH DAILY 06/27/23   Jordan, Peter M, MD  Multiple Vitamins-Minerals (ICAPS PO) Take 1 capsule by mouth daily.    [provider]  olmesartan (BENICAR) 40 MG tablet Take 40 mg by mouth daily.    [provider]  REFRESH CELLUVISC 1 % GEL Place 1 drop into the right eye at bedtime. 10/02/21   [provider]  Serum Base  LIQD 1 drop.    [provider]  Tafluprost , PF, 0.0015 % SOLN Place 1 drop into the right eye at bedtime.    [provider]                                                                                                                                    Past Surgical History Past Surgical History:  Procedure Laterality Date   CARDIOVERSION N/A 08/25/2020   Procedure: CARDIOVERSION;  Surgeon: Kate Lonni CROME, MD;  Location: [facility] ENDOSCOPY;  Service: Cardiovascular;  Laterality: N/A;   CATARAC     CATARACT EXTRACTION     PARTIAL HIP ARTHROPLASTY     RETINAL DETACHMENT SURGERY     TOTAL HIP ARTHROPLASTY Right 1990   Family History Family History  Problem Relation Age of Onset   Cancer Mother    Heart disease Father    Heart attack Brother     Social History Social History[1] Allergies Shellfish protein-containing drug products and Iodinated contrast media  Review of Systems Review of Systems  Physical Exam Vital Signs  I have reviewed the triage vital signs BP (!) 151/56 (BP Location: Left Arm)   Pulse 69   Temp 97.7 F (36.5 C) (Oral)   Resp 20   SpO2 100%   Physical Exam Vitals and nursing note reviewed.  HENT:     Head: Normocephalic.     Mouth/Throat:     Mouth: Mucous membranes are moist.  Eyes:     Pupils: Pupils are equal, round, and reactive to light.  Cardiovascular:     Rate and Rhythm: Normal rate.  Pulmonary:     Effort: Pulmonary effort is normal.  Abdominal:     General: Abdomen is flat.     Palpations: Abdomen is soft.     Tenderness: There is abdominal tenderness.     Comments: No rigidity, no guarding, no distention.  Patient with some generalized tenderness without any focal points of tenderness.  Musculoskeletal:        General: Normal range of motion.  Skin:    General: Skin is warm.  Neurological:  General: No focal deficit present.     Mental Status: She is alert.     ED Results and  Treatments Labs (all labs ordered are listed, but only abnormal results are displayed) Labs Reviewed  COMPREHENSIVE METABOLIC PANEL WITH GFR - Abnormal; Notable for the following components:      Result Value   Glucose, Bld 147 (*)    All other components within normal limits  CBC - Abnormal; Notable for the following components:   Hemoglobin 11.2 (*)    HCT 35.4 (*)    All other components within normal limits  LIPASE, BLOOD  URINALYSIS, ROUTINE W REFLEX MICROSCOPIC                                                                                                                          Radiology CT ABDOMEN PELVIS WO CONTRAST Result Date: 06/08/2024 CLINICAL DATA:  Nausea, vomiting and poor appetite. Bowel obstruction suspected. EXAM: CT ABDOMEN AND PELVIS WITHOUT CONTRAST TECHNIQUE: Multidetector CT imaging of the abdomen and pelvis was performed following the standard protocol without IV contrast. RADIATION DOSE REDUCTION: This exam was performed according to the departmental dose-optimization program which includes automated exposure control, adjustment of the mA and/or kV according to patient size and/or use of iterative reconstruction technique. COMPARISON:  Abdominopelvic CT 12/04/2021. FINDINGS: Lower chest: Scattered small nodules at both lung bases are stable from previous CT, consistent with benign findings. No significant pleural effusion. Stable small hiatal hernia. Hepatobiliary: The liver has a non cirrhotic morphology without suspicious focal abnormality on noncontrast imaging. There are calcified gallstones without evidence of gallbladder wall thickening, surrounding inflammation or biliary ductal dilatation. Pancreas: Unremarkable. No pancreatic ductal dilatation or surrounding inflammatory changes. Spleen: Normal in size without focal abnormality. Adrenals/Urinary Tract: Both adrenal glands appear normal. No evidence of urinary tract calculus, hydronephrosis or perinephric soft  tissue stranding. Simple appearing cyst in the lower pole of the left kidney measuring 3.2 cm, not well seen previously secondary to motion artifact; no specific follow-up imaging recommended. The bladder appears unremarkable for its degree of distention although is partially obscured by artifact from the right hip arthroplasty. Stomach/Bowel: Possible minimal enteric contrast within the stomach and proximal small bowel. The stomach is decompressed. There are multiple mildly dilated and fluid-filled loops of mid to distal small bowel. In addition, the proximal colon is distended and fluid-filled. There is concern of an obstructing apple-core lesion in the proximal transverse colon measuring up to 4.5 cm in length on coronal image 62/5. This appearance is highly concerning for colon cancer. The distal colon appears decompressed. There are scattered mild diverticular changes within the descending and sigmoid colon. Vascular/Lymphatic: Mild soft tissue stranding around the suspected transverse colon mass. No enlarged abdominopelvic lymph nodes are identified. Aortic and branch vessel atherosclerosis without evidence of aneurysm. Reproductive: The uterus and ovaries appear unremarkable. No adnexal mass. Other: No ascites or peritoneal nodularity. No evidence of abdominal wall hernia. Musculoskeletal: No acute osseous findings. Status  post right total hip arthroplasty with stable 2.5 cm fluid collection along the right iliopsoas tendon (image 64/2), likely bursitis. Progressive severe left hip arthropathy with partial collapse of the left femoral head. Relatively mild spondylosis with a convex right scoliosis. IMPRESSION: 1. Findings are highly concerning for colon cancer in the proximal transverse colon with associated colonic obstruction. Recommend further evaluation with colonoscopy. 2. No evidence of metastatic disease. 3. Cholelithiasis without evidence of cholecystitis or biliary ductal dilatation. 4. Progressive  severe left hip arthropathy with partial collapse of the left femoral head. 5.  Aortic Atherosclerosis (ICD10-I70.0). 6. These results were called by telephone at the time of interpretation on 06/08/2024 at 9:54 am to provider [address] [facility] , who verbally acknowledged these results. Electronically Signed   By: Elsie Perone M.D.   On: 06/08/2024 09:54    Pertinent labs & imaging results that were available during my care of the patient were reviewed by me and considered in my medical decision making (see MDM for details).  Medications Ordered in ED Medications  sodium chloride  0.9 % bolus 1,000 mL (has no administration in time range)  ondansetron  (ZOFRAN ) injection 4 mg (4 mg Intravenous Given 06/08/24 0939)  lidocaine  (XYLOCAINE ) 2 % viscous mouth solution 15 mL (15 mLs Mouth/Throat Given 06/08/24 1018)  morphine  (PF) 2 MG/ML injection 2 mg (2 mg Intravenous Given 06/08/24 1019)                                                                                                                                     Procedures Procedures  (including critical care time)  Medical Decision Making / ED Course   This patient presents to the ED for concern of Abdominal pain vomiting, this involves an extensive number of treatment options, and is a complaint that carries with it a high risk of complications and morbidity.  The differential diagnosis includes enteritis, bowel obstruction, intra-abdominal infection, ACS.  MDM: Patient with normal vital signs, overall well-appearing on exam.  I provided her with some antiemetics and IV fluids.  Blood work and CT imaging ordered.  Patient with a contrast allergy.  Reassessment 10:05 AM-unfortunately, patient has a colonic obstruction secondary to likely malignancy.  Will place an NG tube on the patient.  Discussed concerning findings with patient patient's son at bedside.  General surgery consult placed although patient likely has limited surgical  options.  Reassessment 11 AM-NG tube placed.  Placed on low intermittent suction.  General surgery will come and round on the patient.  Message sent to gastroenterology.  Will admit to hospitalist.   Additional history obtained: -Additional history obtained from son at bedside -External records from outside source obtained and reviewed including: Chart review including previous notes, labs, imaging, consultation notes   Lab Tests: -I ordered, reviewed, and interpreted labs.   The pertinent results include:   Labs Reviewed  COMPREHENSIVE METABOLIC PANEL WITH GFR - Abnormal; Notable for  the following components:      Result Value   Glucose, Bld 147 (*)    All other components within normal limits  CBC - Abnormal; Notable for the following components:   Hemoglobin 11.2 (*)    HCT 35.4 (*)    All other components within normal limits  LIPASE, BLOOD  URINALYSIS, ROUTINE W REFLEX MICROSCOPIC      EKG my independent review of the patient's EKG shows no ST segment depressions or elevations, no T wave inversions, no evidence of acute ischemia.  EKG Interpretation Date/Time:  Monday June 08 2024 08:46:13 EST Ventricular Rate:  69 PR Interval:  177 QRS Duration:  104 QT Interval:  432 QTC Calculation: 463 R Axis:   -10  Text Interpretation: Sinus rhythm LVH with secondary repolarization abnormality Confirmed by Mannie Pac [phone]) on 06/08/2024 9:09:16 AM         Imaging Studies ordered: I ordered imaging studies including CT abdomen pelvis, chest x-ray I independently visualized and interpreted imaging. I agree with the radiologist interpretation   Medicines ordered and prescription drug management: Meds ordered this encounter  Medications   ondansetron  (ZOFRAN ) injection 4 mg   sodium chloride  0.9 % bolus 1,000 mL   lidocaine  (XYLOCAINE ) 2 % viscous mouth solution 15 mL   morphine  (PF) 2 MG/ML injection 2 mg    -I have reviewed the patients home medicines and  have made adjustments as needed    Cardiac Monitoring: The patient was maintained on a cardiac monitor.  I personally viewed and interpreted the cardiac monitored which showed an underlying rhythm of: Normal sinus rhythm  Social Determinants of Health:  Factors impacting patients care include: Advanced age   Reevaluation: After the interventions noted above, I reevaluated the patient and found that they have :improved  Co morbidities that complicate the patient evaluation  Past Medical History:  Diagnosis Date   Arthritis    Blindness of left eye    Fatigue    GERD (gastroesophageal reflux disease)    Glaucoma    Macular degeneration    MVP (mitral valve prolapse)    Palpitations    SVT (supraventricular tachycardia)        Final Clinical Impression(s) / ED Diagnoses Final diagnoses:  Complete intestinal obstruction, unspecified cause (HCC)     @PCDICTATION @     [1]  Social History Tobacco Use   Smoking status: Former    Current packs/day: 0.00    Types: Cigarettes    Quit date: 09/07/1979    Years since quitting: 44.7   Smokeless tobacco: Never  Substance Use Topics   Alcohol  use: Yes    Comment: occaisional wine   Drug use: No     Mannie Pac T, DO 06/08/24 1103  "

## 2024-06-09 ENCOUNTER — Inpatient Hospital Stay (HOSPITAL_COMMUNITY)

## 2024-06-09 DIAGNOSIS — I10 Essential (primary) hypertension: Secondary | ICD-10-CM | POA: Diagnosis not present

## 2024-06-09 DIAGNOSIS — I480 Paroxysmal atrial fibrillation: Secondary | ICD-10-CM | POA: Diagnosis not present

## 2024-06-09 DIAGNOSIS — K56609 Unspecified intestinal obstruction, unspecified as to partial versus complete obstruction: Secondary | ICD-10-CM | POA: Diagnosis not present

## 2024-06-09 DIAGNOSIS — D500 Iron deficiency anemia secondary to blood loss (chronic): Secondary | ICD-10-CM | POA: Diagnosis not present

## 2024-06-09 DIAGNOSIS — K6389 Other specified diseases of intestine: Secondary | ICD-10-CM | POA: Diagnosis not present

## 2024-06-09 LAB — BASIC METABOLIC PANEL WITH GFR
Anion gap: 11 (ref 5–15)
BUN: 24 mg/dL — ABNORMAL HIGH (ref 8–23)
CO2: 22 mmol/L (ref 22–32)
Calcium: 9.3 mg/dL (ref 8.9–10.3)
Chloride: 108 mmol/L (ref 98–111)
Creatinine, Ser: 0.68 mg/dL (ref 0.44–1.00)
GFR, Estimated: >60 mL/min
Glucose, Bld: 128 mg/dL — ABNORMAL HIGH (ref 70–99)
Potassium: 3.9 mmol/L (ref 3.5–5.1)
Sodium: 140 mmol/L (ref 135–145)

## 2024-06-09 LAB — CBC
HCT: 33 % — ABNORMAL LOW (ref 36.0–46.0)
Hemoglobin: 10.6 g/dL — ABNORMAL LOW (ref 12.0–15.0)
MCH: 29.3 pg (ref 26.0–34.0)
MCHC: 32.1 g/dL (ref 30.0–36.0)
MCV: 91.2 fL (ref 80.0–100.0)
Platelets: 281 K/uL (ref 150–400)
RBC: 3.62 MIL/uL — ABNORMAL LOW (ref 3.87–5.11)
RDW: 14.1 % (ref 11.5–15.5)
WBC: 6.3 K/uL (ref 4.0–10.5)
nRBC: 0 % (ref 0.0–0.2)

## 2024-06-09 MED ORDER — SODIUM CHLORIDE 0.9 % IV SOLN: INTRAVENOUS | Status: AC

## 2024-06-09 NOTE — TOC Initial Note (Signed)
 Transition of Care [facility]) - Initial/Assessment Note    Patient Details  Name: Regina Taylor MRN: [id number] Date of Birth: 07-Dec-1927  Transition of [facility]) CM/SW Contact:    Toy LITTIE Agar, RN Phone Number:[phone]  06/09/2024, 2:47 PM  Clinical Narrative:                 Inpatient care manager following. MD made CM aware of referral for Hospice. CM at bedside to offer choice. Patients son at bedside states that he would like hospice services with [facility]. Referral has been called to [facility]. Patient currently has some personal care services through [facility]. Son Viktoria states that family will decide on care needed once speaking with [facility]. CM following for disposition planning.   Expected Discharge Plan: Home w Hospice Care Barriers to Discharge: Continued Medical Work up   Patient Goals and CMS Choice Patient states their goals for this hospitalization and ongoing recovery are:: Wants to work on plan for discharge CMS Medicare.gov Compare Post Acute Care list provided to:: Patient Choice offered to / list presented to : Adult Children [facility] ownership interest in [facility].provided to:: Patient    Expected Discharge Plan and Services In-house Referral: NA Discharge Planning Services: CM Consult Post Acute Care Choice: Hospice Living arrangements for the past 2 months: Single Family Home                 DME Arranged: N/A DME Agency: NA       HH Arranged:  (Hospice) HH Agency: [facility] Date HH Agency Contacted: 06/09/24 Time HH Agency Contacted: 1447 Representative spoke with at [facility] Agency: [facility]  Prior Living Arrangements/Services Living arrangements for the past 2 months: Single Family Home Lives with:: Self Patient language and need for interpreter reviewed:: Yes Do you feel safe going back to the place where you live?: Yes      Need for Family Participation in Patient  Care: Yes (Comment) Care giver support system in place?: Yes (comment) Current home services: Other (comment) (PCS  through [place]) Criminal Activity/Legal Involvement Pertinent to Current Situation/Hospitalization: No - Comment as needed  Activities of Daily Living   ADL Screening (condition at time of admission) Independently performs ADLs?: Yes (appropriate for developmental age) Is the patient deaf or have difficulty hearing?: No Does the patient have difficulty seeing, even when wearing glasses/contacts?: Yes Does the patient have difficulty concentrating, remembering, or making decisions?: No  Permission Sought/Granted Permission sought to share information with : Facility Industrial/product Designer granted to share information with : Yes, Verbal Permission Granted  Share Information with NAME: Tareka Jhaveri     Permission granted to share info w Relationship: son  Permission granted to share info w Contact Information: [phone]  Emotional Assessment Appearance:: Appears stated age Attitude/Demeanor/Rapport: Gracious Affect (typically observed): Pleasant Orientation: : Oriented to Self, Oriented to Situation Alcohol  / Substance Use: Not Applicable Psych Involvement: No (comment)  Admission diagnosis:  Colonic obstruction (HCC) [K56.609] Small bowel obstruction (HCC) [K56.609] Atrial fibrillation with RVR (HCC) [I48.91] Complete intestinal obstruction, unspecified cause (HCC) [K56.601] Patient Active Problem List   Diagnosis Date Noted   Colonic obstruction (HCC) 06/08/2024   Dysphagia 03/31/2024   History of paroxysmal supraventricular tachycardia 03/31/2024   Iron deficiency anemia 03/31/2024   Hypercholesterolemia 03/31/2024   Reduced visual acuity 03/31/2024   Thrombophilia 03/31/2024   Anticoagulant long-term use 04/16/2022   Left-sided epistaxis 04/16/2022   Band  keratopathy of right eye 02/12/2022   Dry eye syndrome of right eye 02/12/2022    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   Chronic diastolic heart failure (HCC) 12/07/2021   Hypertension 12/07/2021   Aortic atherosclerosis 12/07/2021   Pain of lumbar spine 07/17/2021   Pain of left hip joint 12/01/2020   Persistent atrial fibrillation (HCC)    Atrial fibrillation with rapid ventricular response (HCC) 03/17/2020   Blurred vision 01/27/2014   Syncope 01/25/2014   Glaucoma    Fatigue    Arthritis    DOE (dyspnea on exertion)    SVT (supraventricular tachycardia)    MVP (mitral valve prolapse)    Blindness of left eye    PCP:  Claudene Pellet, MD Pharmacy:   [facility] [place], [place] - [address] [address] [place] [address] Phone: [phone] Fax: [phone]     Social Drivers of Health (SDOH) Social History: SDOH Screenings   Food Insecurity: No Food Insecurity (06/08/2024)  Housing: Low Risk (06/08/2024)  Transportation Needs: No Transportation Needs (06/08/2024)  Utilities: Not At Risk (06/08/2024)  Social Connections: Moderately Isolated (06/08/2024)  Tobacco Use: Medium Risk (06/08/2024)   SDOH Interventions:     Readmission Risk Interventions     No data to display

## 2024-06-09 NOTE — Progress Notes (Signed)
 " Progress Note   Patient: Regina Taylor FMW:[id number] DOB: [birth year] DOA: 06/08/2024     1 DOS: the patient was seen and examined on 06/09/2024   Brief hospital course:  Regina Taylor is a 89 y.o. female with medical history significant for glaucoma, macular degeneration, atrial fibrillation on Eliquis  being admitted to the hospital with concern for obstructing colon cancer.  Patient has been experiencing vague abdominal discomfort, nausea.  Started having vomiting presented to ER.  CT abdomen pelvis findings are highly concerning for colon cancer and proximal transverse colon and associated colonic obstruction.  NG tube placed in the ED, GI and general surgery consulted, admitted to [facility] service for further management evaluation.  Assessment and Plan: Obstructing colon mass- CT evidence of proximal transverse colon mass with obstruction- Patient is evaluated by GI and general surgery and did not recommend any further workup as she does not wish aggressive interventions including colonoscopy as she understands the risks involved due to her age and comorbid conditions. Palliative team on board discussed about symptom management. Continue NG tube to low wall suction, pain control, antiemetics. Patient wishes to go home with palliative care follow-up.  Chronic iron deficiency anemia: Stable Continue iron supplements upon discharge.  Paroxysmal A-fib: Resumed home amiodarone , Toprol , Eliquis  therapy.  Essential hypertension: Continue home medications ARB, Toprol , Cardizem .  Glaucoma-Home eyedrops resumed.     Out of bed to chair. Incentive spirometry. Nursing supportive care. Fall, aspiration precautions. Diet:  Diet Orders (From admission, onward)     Start     Ordered   06/08/24 1219  Diet NPO time specified Except for: Sips with Meds, Ice Chips  Diet effective now       Question Answer Comment  Except for Sips with Meds   Except for Ice Chips      06/08/24 1219            DVT prophylaxis:  apixaban  (ELIQUIS ) tablet 5 mg  Level of care: Med-Surg   Code Status: Limited: Do not attempt resuscitation (DNR) -DNR-LIMITED -Do Not Intubate/DNI   Subjective: Patient is seen and examined today morning.  Her abdominal pain is much better.  NG tube to low wall suction noted.  Nausea improved.  Physical Exam: Vitals:   06/08/24 1832 06/08/24 2135 06/09/24 0211 06/09/24 1316  BP: (!) 150/59 (!) 141/57 (!) 149/62 (!) 149/56  Pulse: 77 75 68 74  Resp: 16 18 18 16   Temp: 98.1 F (36.7 C) 97.7 F (36.5 C) 97.6 F (36.4 C)   TempSrc: Oral Oral Oral   SpO2: 99% 96% 95% 97%  Weight:      Height:        General - Elderly Caucasian female, no apparent distress HEENT - PERRLA, EOMI, atraumatic head, non tender sinuses. Lung - Clear, basal rales, no rhonchi, wheezes. Heart - S1, S2 heard, no murmurs, rubs, or is pedal edema. Abdomen - Soft, non tender, bowel sounds decreased Neuro - Alert, awake and oriented x 3, non focal exam. Skin - Warm and dry.  Data Reviewed:      Latest Ref Rng & Units 06/09/2024    5:41 AM 06/08/2024    9:40 AM 05/05/2024    5:00 PM  CBC  WBC 4.0 - 10.5 K/uL 6.3  7.3  7.2   Hemoglobin 12.0 - 15.0 g/dL 89.3  88.7  9.7   Hematocrit 36.0 - 46.0 % 33.0  35.4  30.0   Platelets 150 - 400 K/uL 281  301  274  Latest Ref Rng & Units 06/09/2024    5:41 AM 06/08/2024    9:40 AM 05/05/2024    5:00 PM  BMP  Glucose 70 - 99 mg/dL 871  852  891   BUN 8 - 23 mg/dL 24  23  33   Creatinine 0.44 - 1.00 mg/dL 9.31  9.24  8.93   BUN/Creat Ratio 12 - 28   31   Sodium 135 - 145 mmol/L 140  136  140   Potassium 3.5 - 5.1 mmol/L 3.9  4.3  4.8   Chloride 98 - 111 mmol/L 108  100  106   CO2 22 - 32 mmol/L 22  24  21    Calcium  8.9 - 10.3 mg/dL 9.3  9.3  9.8    DG Abd Portable 1V-Small Bowel Obstruction Protocol-initial, 8 hr delay Result Date: 06/09/2024 CLINICAL DATA:  Small bowel obstruction.  Protocol. EXAM: PORTABLE ABDOMEN - 1 VIEW  COMPARISON:  06/08/2024 at 1422 hours. FINDINGS: NG tube tip is in the distal stomach in the pyloric region. Distended small bowel again noted measuring up to 3.7 cm diameter. Dilated opacified small bowel is seen in the left abdomen and pelvis. Right colon is distended and opacified. The right colon is noted to be fluid-filled and distended on CT imaging yesterday. Appearance today may reflect some opacification of the right colonic fluid although attenuation seen on the current x-ray may simply reflect colonic fluid. IMPRESSION: 1. Persistent small bowel dilatation with opacified distended small bowel in the left abdomen and pelvis. 2. Right colon is distended and relatively dense. The right colon was noted to be fluid-filled and distended on CT imaging yesterday. Appearance on today's x-ray suggests migration of contrast into the right colonic fluid. Electronically Signed   By: Camellia Candle M.D.   On: 06/09/2024 06:52   DG Abd Portable 1V-Small Bowel Protocol-Position Verification Result Date: 06/08/2024 EXAM: 1 VIEW XRAY OF THE ABDOMEN 06/08/2024 02:26:00 PM COMPARISON: 06/08/2024 CLINICAL HISTORY: Encounter for imaging study to confirm nasogastric (NG) tube placement FINDINGS: LINES, TUBES AND DEVICES: Enteric tube in place with distal tip and side port terminating within the expected location of the gastric body. BOWEL: Nonobstructive bowel gas pattern. SOFT TISSUES: No abnormal calcifications. BONES: No acute fracture. IMPRESSION: 1. Enteric tube tip and side port terminate within the expected location of the gastric body. Electronically signed by: Greig Pique MD 06/08/2024 03:18 PM EST RP Workstation: [id number]   DG Abd Portable 1 View Result Date: 06/08/2024 CLINICAL DATA:  Nasogastric tube placement. EXAM: PORTABLE ABDOMEN - 1 VIEW COMPARISON:  None. FINDINGS: Nasogastric tube terminates in the gastric antrum. Unremarkable bowel gas pattern. IMPRESSION: Nasogastric tube terminates in the gastric  antrum. Electronically Signed   By: Newell Eke M.D.   On: 06/08/2024 11:16   CT ABDOMEN PELVIS WO CONTRAST Result Date: 06/08/2024 CLINICAL DATA:  Nausea, vomiting and poor appetite. Bowel obstruction suspected. EXAM: CT ABDOMEN AND PELVIS WITHOUT CONTRAST TECHNIQUE: Multidetector CT imaging of the abdomen and pelvis was performed following the standard protocol without IV contrast. RADIATION DOSE REDUCTION: This exam was performed according to the departmental dose-optimization program which includes automated exposure control, adjustment of the mA and/or kV according to patient size and/or use of iterative reconstruction technique. COMPARISON:  Abdominopelvic CT 12/04/2021. FINDINGS: Lower chest: Scattered small nodules at both lung bases are stable from previous CT, consistent with benign findings. No significant pleural effusion. Stable small hiatal hernia. Hepatobiliary: The liver has a non cirrhotic morphology without suspicious focal abnormality  on noncontrast imaging. There are calcified gallstones without evidence of gallbladder wall thickening, surrounding inflammation or biliary ductal dilatation. Pancreas: Unremarkable. No pancreatic ductal dilatation or surrounding inflammatory changes. Spleen: Normal in size without focal abnormality. Adrenals/Urinary Tract: Both adrenal glands appear normal. No evidence of urinary tract calculus, hydronephrosis or perinephric soft tissue stranding. Simple appearing cyst in the lower pole of the left kidney measuring 3.2 cm, not well seen previously secondary to motion artifact; no specific follow-up imaging recommended. The bladder appears unremarkable for its degree of distention although is partially obscured by artifact from the right hip arthroplasty. Stomach/Bowel: Possible minimal enteric contrast within the stomach and proximal small bowel. The stomach is decompressed. There are multiple mildly dilated and fluid-filled loops of mid to distal small  bowel. In addition, the proximal colon is distended and fluid-filled. There is concern of an obstructing apple-core lesion in the proximal transverse colon measuring up to 4.5 cm in length on coronal image 62/5. This appearance is highly concerning for colon cancer. The distal colon appears decompressed. There are scattered mild diverticular changes within the descending and sigmoid colon. Vascular/Lymphatic: Mild soft tissue stranding around the suspected transverse colon mass. No enlarged abdominopelvic lymph nodes are identified. Aortic and branch vessel atherosclerosis without evidence of aneurysm. Reproductive: The uterus and ovaries appear unremarkable. No adnexal mass. Other: No ascites or peritoneal nodularity. No evidence of abdominal wall hernia. Musculoskeletal: No acute osseous findings. Status post right total hip arthroplasty with stable 2.5 cm fluid collection along the right iliopsoas tendon (image 64/2), likely bursitis. Progressive severe left hip arthropathy with partial collapse of the left femoral head. Relatively mild spondylosis with a convex right scoliosis. IMPRESSION: 1. Findings are highly concerning for colon cancer in the proximal transverse colon with associated colonic obstruction. Recommend further evaluation with colonoscopy. 2. No evidence of metastatic disease. 3. Cholelithiasis without evidence of cholecystitis or biliary ductal dilatation. 4. Progressive severe left hip arthropathy with partial collapse of the left femoral head. 5.  Aortic Atherosclerosis (ICD10-I70.0). 6. These results were called by telephone at the time of interpretation on 06/08/2024 at 9:54 am to provider [facility] , who verbally acknowledged these results. Electronically Signed   By: Elsie Perone M.D.   On: 06/08/2024 09:54    Family Communication: Discussed with patient, understand and agree. All questions answered.  Disposition: Status is: Inpatient Remains inpatient appropriate because:  Colon mass, NG tube, pain control  Planned Discharge Destination: Home with Home Health     Time spent: 47 minutes  Author: Concepcion Riser, MD 06/09/2024 1:28 PM Secure chat 7am to 7pm For on call review [email or web address].    "

## 2024-06-09 NOTE — Consult Note (Signed)
 " Palliative Medicine Inpatient Consult Note  Consulting Provider:  Zella Katha HERO, MD   Reason for consult:   Palliative Care Consult Services Palliative Medicine Consult  Reason for Consult? Colon obstruction, discussed goals of care and outpatient palliative, eventual transition to home hospice.   06/09/2024  HPI:  Per intake H&P -->   Regina Taylor is a 63 year olf female who has a past medical history of arthritis, visual impairment due to glaucoma and macular degeneration, GERD, mitral valve prolapse, hypertension, atrial fibrillation, hypercholesterolemia, iron deficiency, and diastolic heart failure.  Regina Taylor was admitted on 19 January in the setting of abdominal discomfort and nausea.  Thus far her workup has revealed partial obstruction of her colon worrisome for potential colon cancer.  The palliative care team has been asked to support additional goals of care conversations.  Clinical Assessment/Goals of Care:  I have reviewed medical records including EPIC notesDR. Vreeland, Dr. Signe, & Dr. Zella , labs inclusive of basic metabolic panel and complete blood cell count from this morning.  Also reviewed urine analysis.  Reviewed CT abdomen and pelvis from yesterday, abdominal x-ray, and abdominal x-ray from this morning., received report from bedside RN, assessed the patient who is lying in bed in no acute distress.  She shares her pain and nausea right now are not significant.    I met with Regina Taylor and her son, Regina Taylor (later in the morning) to further discuss diagnosis prognosis, GOC, EOL wishes, disposition and options.   I introduced Palliative Medicine as specialized medical care for people living with serious illness. It focuses on providing relief from the symptoms and stress of a serious illness. The goal is to improve quality of life for both the patient and the family.  Medical History Review and Understanding:  A review of Regina Taylor's past medical history  is significant for hypertension, hypercholesterolemia, glucoma, macular degeneration, left eye blindness, arthritis, GERD, atrial fibrillation, diastolic heart failure close completed  Social History:  Regina Taylor shares that she considers [place], [place]  in her home.  She shares she was at 1 point but divorced many years ago.  She has 1 son, Regina Taylor, 2 grandchildren, and 5 great-grandchildren.  She formerly Counsellor at [employer].  Ashrita's highest level of degree is her doctoral degree.  Sergio gets joy out of helping other people spending time with friends.  She shares before many of her friends passed away and she enjoyed going to the opera, eating out, going to the movies. She does not vocalize any over religious values.   Functional and Nutritional State:  Preceding hospitalization Regina Taylor was living independently in a single-family home.  She had a care aide 3 days a week from [place].  With support her with meals.  She had been able to mobilize with a walker and has been able to attend to B ADLs.  Preceding hospitalization Regina Taylor had not had any deficits nutritionally.  Advance Directives:  A detailed discussion was had today regarding advanced directives.  Regina Taylor does have advanced directives which were completed many years ago.  Her surrogate decision maker is her son Regina Taylor.  Code Status:  Concepts specific to code status, artifical feeding and hydration, continued IV antibiotics and rehospitalization was had.  The difference between a aggressive medical intervention path  and a palliative comfort care path for this patient at this time was had.   Confirmed with Regina Taylor that she is an established DO NOT RESUSCITATE/DO NOT INTUBATE CODE STATUS.  She would not want heroic  measures should she have cardiac arrest.  Discussion:  Zandra and I reviewed the reason surrounding her hospitalization inclusive of her on Sunday morning not feeling like herself.  She shares she had more bloating and did not have  the desire to eat or drink.  She shares when her son came and saw her later that day it was established that she should come be evaluated.  Regina Taylor has since being hospitalized under started her obstructive process and that this is possibly related to colon cancer.  She and her son had discussed the risks and benefits of doing further workup however she has opted to not pursue these measures given the potential adverse effects and reality that she will end up in the same place either way.  We discussed continuing present conservative measures to see if her obstruction can resolve itself.  We discussed best case and worst-case scenarios and what to consider moving into the future with the potential diagnosis of colon cancer.  The topics of comfort care and hospice care were broached.  Regina Taylor shares if she is at end-of-life she would want to be home.  She does understand the need for 24/7 care and would be open to this if she is able to stabilize to the point where home is a realistic option.  Regina Taylor shares me that many of her friends have passed on she has lived a wonderful 77 years honor.  She shares understanding that something is going to take her.  Allowed her time and space to express herself as well as the losses that she has endured over the years. __________________  Patient son and I met later this morning. Informed him of the above conversations.  Both Regina Taylor and her son are open to home with hospice if recommended by the primary team though they also understand the need to allow more time to see if patients obstructive process symptoms will improve.  Regina Taylor and her son confirm the plan for further information on hospice services.  Reviewed that she will need caregiver resources.  Discussed potentially [facility] versus hospice of the [place] patient would like to take time to think about this.  Discussed the importance of continued conversation with family and their  medical providers regarding  overall plan of care and treatment options, ensuring decisions are within the context of the patients values and GOCs.  Decision Maker: Regina Taylor,Regina Taylor: Son, Emergency Contact: [phone] (Mobile   SUMMARY OF RECOMMENDATIONS   DNAR/DNI  Discussed patient bowel obstruction and the hope for improvement with present measures  Discussed concern for colon cancer - no plans for further WU at this time  Discussed the idea of hospice care - patient and her son are interested in learning more about this resource as well as 24/7 home caregivers  Appreciate TOC speaking to patient and family in regards to resources for home  Ongoing conversations  Code Status/Advance Care Planning: DNAR/DNI   Symptom Management:  Pain:  - Dilaudid  0.5mg  - 1mg  IVP Q2H PRN  Nausea: - Zofran  4mg  IV Q6H PRN --> Of note Qtc was 463 (1/19)  Palliative Prophylaxis:  Aspiration, Bowel Regimen, Delirium Protocol, Frequent Pain Assessment, Oral Care, Palliative Wound Care, and Turn Reposition  Additional Recommendations (Limitations, Scope, Preferences): Continue current care  Psycho-social/Spiritual:  Desire for further Chaplaincy support: No Additional Recommendations: Education on colon CA - potential outcomes   Prognosis: Limited based upon chronic disease burden and concerns for new colon CA  Discharge Planning: To be determined.   Vitals:  06/08/24 2135 06/09/24 0211  BP: (!) 141/57 (!) 149/62  Pulse: 75 68  Resp: 18 18  Temp: 97.7 F (36.5 C) 97.6 F (36.4 C)  SpO2: 96% 95%    Intake/Output Summary (Last 24 hours) at 06/09/2024 0843 Last data filed at 06/08/2024 1739 Gross per 24 hour  Intake 0 ml  Output 200 ml  Net -200 ml   Last Weight  Most recent update: 06/08/2024  2:35 PM    Weight  70.5 kg (155 lb 6.8 oz)             LABS: CBC:    Component Value Date/Time   WBC 6.3 06/09/2024 0541   HGB 10.6 (L) 06/09/2024 0541   HGB 9.7 (L) 05/05/2024 1700   HCT 33.0 (L)  06/09/2024 0541   HCT 30.0 (L) 05/05/2024 1700   PLT 281 06/09/2024 0541   PLT 274 05/05/2024 1700   MCV 91.2 06/09/2024 0541   MCV 92 05/05/2024 1700   NEUTROABS 3.9 08/15/2020 1144   LYMPHSABS 1.2 08/15/2020 1144   MONOABS 0.6 03/17/2020 1444   EOSABS 0.1 08/15/2020 1144   BASOSABS 0.0 08/15/2020 1144   Comprehensive Metabolic Panel:    Component Value Date/Time   NA 140 06/09/2024 0541   NA 140 05/05/2024 1700   K 3.9 06/09/2024 0541   CL 108 06/09/2024 0541   CO2 22 06/09/2024 0541   BUN 24 (H) 06/09/2024 0541   BUN 33 05/05/2024 1700   CREATININE 0.68 06/09/2024 0541   GLUCOSE 128 (H) 06/09/2024 0541   CALCIUM  9.3 06/09/2024 0541   AST 18 06/08/2024 0940   ALT 12 06/08/2024 0940   ALKPHOS 86 06/08/2024 0940   BILITOT 0.4 06/08/2024 0940   BILITOT 0.2 10/23/2021 1031   PROT 7.5 06/08/2024 0940   PROT 7.1 10/23/2021 1031   ALBUMIN 4.2 06/08/2024 0940   ALBUMIN 4.2 10/23/2021 1031    Gen:  Elderly Causasian F chronically ill in appearance HEENT: Dry mucous membranes CV: Regular rate and rhythm  PULM: On RA - breathing even and nonlabored ABD: Distended, (+) tenderness on palpation EXT: No edema  Neuro: Alert and oriented x3   PPS: 50%   This conversation/these recommendations were discussed with patient primary care team, Dr. Darci ______________________________________________________ Regina Taylor [facility] Palliative Medicine Team Team Cell Phone: [phone] Please utilize secure chat with additional questions, if there is no response within 30 minutes please call the above phone number  I personally spent a total of 75 minutes in the care of the patient today including preparing to see the patient, getting/reviewing separately obtained history, performing a medically appropriate exam/evaluation, counseling and educating, placing orders, referring and communicating with other health care professionals, documenting clinical information in the EHR,  independently interpreting results, and coordinating care.  "

## 2024-06-09 NOTE — Plan of Care (Signed)
  Problem: Education: Goal: Knowledge of General Education information will improve Description: Including pain rating scale, medication(s)/side effects and non-pharmacologic comfort measures Outcome: Progressing   Problem: Health Behavior/Discharge Planning: Goal: Ability to manage health-related needs will improve Outcome: Progressing   Problem: Clinical Measurements: Goal: Ability to maintain clinical measurements within normal limits will improve Outcome: Progressing Goal: Will remain free from infection Outcome: Progressing Goal: Diagnostic test results will improve Outcome: Progressing Goal: Respiratory complications will improve Outcome: Progressing Goal: Cardiovascular complication will be avoided Outcome: Progressing   Problem: Nutrition: Goal: Adequate nutrition will be maintained Outcome: Progressing   Problem: Activity: Goal: Risk for activity intolerance will decrease Outcome: Progressing   Problem: Coping: Goal: Level of anxiety will decrease Outcome: Progressing   Problem: Safety: Goal: Ability to remain free from injury will improve Outcome: Progressing   Problem: Skin Integrity: Goal: Risk for impaired skin integrity will decrease Outcome: Progressing

## 2024-06-09 NOTE — Progress Notes (Signed)
 "  Progress Note     Subjective: Son at bedside.  Patient reports abdominal pain has improved with medications. Still having some pain of upper abdomen. Denies nausea currently. Has had episodes of feeling like she is going to have emesis. Denies BM since hospitalization. Reports that she may have had flatulence. NGT in place and currently NPO.   ROS  All negative with the exception of above.  Objective: Vital signs in last 24 hours: Temp:  [97.6 F (36.4 C)-98.2 F (36.8 C)] 97.6 F (36.4 C) (01/20 0211) Pulse Rate:  [68-77] 68 (01/20 0211) Resp:  [16-18] 18 (01/20 0211) BP: (141-151)/(57-62) 149/62 (01/20 0211) SpO2:  [95 %-99 %] 95 % (01/20 0211) Weight:  [70.5 kg] 70.5 kg (01/19 1431) Last BM Date : 06/07/24  Intake/Output from previous day: 01/19 0701 - 01/20 0700 In: 0  Out: 200 [Urine:200] Intake/Output this shift: Total I/O In: -  Out: 100 [Urine:100]  PE: General: Pleasant female who is laying in bed in NAD. HEENT: Head is normocephalic, atraumatic. NGT present. Heart: HR normal during encounter.  Lungs: Respiratory effort nonlabored on room air.  Abd: Soft, ND. Mild tenderness to palpation of upper abdomen. No rebound tenderness or guarding.  MS: Able to move all 4 extremities.  Skin: Warm and dry.  Psych: A&Ox3 with an appropriate affect.    Lab Results:  Recent Labs    06/08/24 0940 06/09/24 0541  WBC 7.3 6.3  HGB 11.2* 10.6*  HCT 35.4* 33.0*  PLT 301 281   BMET Recent Labs    06/08/24 0940 06/09/24 0541  NA 136 140  K 4.3 3.9  CL 100 108  CO2 24 22  GLUCOSE 147* 128*  BUN 23 24*  CREATININE 0.75 0.68  CALCIUM  9.3 9.3   PT/INR No results for input(s): LABPROT, INR in the last 72 hours. CMP     Component Value Date/Time   NA 140 06/09/2024 0541   NA 140 05/05/2024 1700   K 3.9 06/09/2024 0541   CL 108 06/09/2024 0541   CO2 22 06/09/2024 0541   GLUCOSE 128 (H) 06/09/2024 0541   BUN 24 (H) 06/09/2024 0541   BUN 33  05/05/2024 1700   CREATININE 0.68 06/09/2024 0541   CALCIUM  9.3 06/09/2024 0541   PROT 7.5 06/08/2024 0940   PROT 7.1 10/23/2021 1031   ALBUMIN 4.2 06/08/2024 0940   ALBUMIN 4.2 10/23/2021 1031   AST 18 06/08/2024 0940   ALT 12 06/08/2024 0940   ALKPHOS 86 06/08/2024 0940   BILITOT 0.4 06/08/2024 0940   BILITOT 0.2 10/23/2021 1031   GFRNONAA >60 06/09/2024 0541   GFRAA 71 06/01/2020 1156   Lipase     Component Value Date/Time   LIPASE 12 06/08/2024 0940       Studies/Results: DG Abd Portable 1V-Small Bowel Obstruction Protocol-initial, 8 hr delay Result Date: 06/09/2024 CLINICAL DATA:  Small bowel obstruction.  Protocol. EXAM: PORTABLE ABDOMEN - 1 VIEW COMPARISON:  06/08/2024 at 1422 hours. FINDINGS: NG tube tip is in the distal stomach in the pyloric region. Distended small bowel again noted measuring up to 3.7 cm diameter. Dilated opacified small bowel is seen in the left abdomen and pelvis. Right colon is distended and opacified. The right colon is noted to be fluid-filled and distended on CT imaging yesterday. Appearance today may reflect some opacification of the right colonic fluid although attenuation seen on the current x-ray may simply reflect colonic fluid. IMPRESSION: 1. Persistent small bowel dilatation with opacified distended small  bowel in the left abdomen and pelvis. 2. Right colon is distended and relatively dense. The right colon was noted to be fluid-filled and distended on CT imaging yesterday. Appearance on today's x-ray suggests migration of contrast into the right colonic fluid. Electronically Signed   By: Camellia Candle M.D.   On: 06/09/2024 06:52   DG Abd Portable 1V-Small Bowel Protocol-Position Verification Result Date: 06/08/2024 EXAM: 1 VIEW XRAY OF THE ABDOMEN 06/08/2024 02:26:00 PM COMPARISON: 06/08/2024 CLINICAL HISTORY: Encounter for imaging study to confirm nasogastric (NG) tube placement FINDINGS: LINES, TUBES AND DEVICES: Enteric tube in place with  distal tip and side port terminating within the expected location of the gastric body. BOWEL: Nonobstructive bowel gas pattern. SOFT TISSUES: No abnormal calcifications. BONES: No acute fracture. IMPRESSION: 1. Enteric tube tip and side port terminate within the expected location of the gastric body. Electronically signed by: Greig Pique MD 06/08/2024 03:18 PM EST RP Workstation: [id number]   DG Abd Portable 1 View Result Date: 06/08/2024 CLINICAL DATA:  Nasogastric tube placement. EXAM: PORTABLE ABDOMEN - 1 VIEW COMPARISON:  None. FINDINGS: Nasogastric tube terminates in the gastric antrum. Unremarkable bowel gas pattern. IMPRESSION: Nasogastric tube terminates in the gastric antrum. Electronically Signed   By: Newell Eke M.D.   On: 06/08/2024 11:16   CT ABDOMEN PELVIS WO CONTRAST Result Date: 06/08/2024 CLINICAL DATA:  Nausea, vomiting and poor appetite. Bowel obstruction suspected. EXAM: CT ABDOMEN AND PELVIS WITHOUT CONTRAST TECHNIQUE: Multidetector CT imaging of the abdomen and pelvis was performed following the standard protocol without IV contrast. RADIATION DOSE REDUCTION: This exam was performed according to the departmental dose-optimization program which includes automated exposure control, adjustment of the mA and/or kV according to patient size and/or use of iterative reconstruction technique. COMPARISON:  Abdominopelvic CT 12/04/2021. FINDINGS: Lower chest: Scattered small nodules at both lung bases are stable from previous CT, consistent with benign findings. No significant pleural effusion. Stable small hiatal hernia. Hepatobiliary: The liver has a non cirrhotic morphology without suspicious focal abnormality on noncontrast imaging. There are calcified gallstones without evidence of gallbladder wall thickening, surrounding inflammation or biliary ductal dilatation. Pancreas: Unremarkable. No pancreatic ductal dilatation or surrounding inflammatory changes. Spleen: Normal in size without  focal abnormality. Adrenals/Urinary Tract: Both adrenal glands appear normal. No evidence of urinary tract calculus, hydronephrosis or perinephric soft tissue stranding. Simple appearing cyst in the lower pole of the left kidney measuring 3.2 cm, not well seen previously secondary to motion artifact; no specific follow-up imaging recommended. The bladder appears unremarkable for its degree of distention although is partially obscured by artifact from the right hip arthroplasty. Stomach/Bowel: Possible minimal enteric contrast within the stomach and proximal small bowel. The stomach is decompressed. There are multiple mildly dilated and fluid-filled loops of mid to distal small bowel. In addition, the proximal colon is distended and fluid-filled. There is concern of an obstructing apple-core lesion in the proximal transverse colon measuring up to 4.5 cm in length on coronal image 62/5. This appearance is highly concerning for colon cancer. The distal colon appears decompressed. There are scattered mild diverticular changes within the descending and sigmoid colon. Vascular/Lymphatic: Mild soft tissue stranding around the suspected transverse colon mass. No enlarged abdominopelvic lymph nodes are identified. Aortic and branch vessel atherosclerosis without evidence of aneurysm. Reproductive: The uterus and ovaries appear unremarkable. No adnexal mass. Other: No ascites or peritoneal nodularity. No evidence of abdominal wall hernia. Musculoskeletal: No acute osseous findings. Status post right total hip arthroplasty with stable 2.5 cm fluid  collection along the right iliopsoas tendon (image 64/2), likely bursitis. Progressive severe left hip arthropathy with partial collapse of the left femoral head. Relatively mild spondylosis with a convex right scoliosis. IMPRESSION: 1. Findings are highly concerning for colon cancer in the proximal transverse colon with associated colonic obstruction. Recommend further evaluation  with colonoscopy. 2. No evidence of metastatic disease. 3. Cholelithiasis without evidence of cholecystitis or biliary ductal dilatation. 4. Progressive severe left hip arthropathy with partial collapse of the left femoral head. 5.  Aortic Atherosclerosis (ICD10-I70.0). 6. These results were called by telephone at the time of interpretation on 06/08/2024 at 9:54 am to provider [facility] , who verbally acknowledged these results. Electronically Signed   By: Elsie Perone M.D.   On: 06/08/2024 09:54    Anti-infectives: Anti-infectives (From admission, onward)    None        Assessment/Plan 89 year old female with abdominal pain, decreased appetite, nausea/vomiting and CT concerning for malignancy with partial colonic obstruction at the level of the proximal transverse colon.  -Afebrile. -WBC  6.3; HGB 10.6 from 11.2 -Small bowel protocol in progress. 8 hr xray showing contrast in R colonic fluid. -Pain manageable at this time. No nausea currently. Has not had additional emesis but has had episodes of feeling that she might. No BM or definitive flatulence. -Maintain NGT. -Keep NPO. -24 hr xray pending.  -Appreciate palliative care recommendations and consult. -Patient continues to express that she does not desire surgical intervention regaurding concern of malignancy/colonic obstruction.  -Will continue to follow.  FEN: NPO/NGT; IVF per primary team VTE: Eliquis  ID: None currently.   LOS: 1 day   I reviewed specialist notes, consulting provider notes, nursing notes, hospitalist notes, last 24 h vitals and pain scores, last 48 h intake and output, last 24 h labs and trends, and last 24 h imaging results.  This care required moderate level of medical decision making.   Marjorie Carlyon Favre, [facility] 06/09/2024, 11:30 AM Please see Amion for pager number during day hours 7:00am-4:30pm  "

## 2024-06-10 DIAGNOSIS — K6389 Other specified diseases of intestine: Secondary | ICD-10-CM | POA: Insufficient documentation

## 2024-06-10 DIAGNOSIS — I480 Paroxysmal atrial fibrillation: Secondary | ICD-10-CM | POA: Diagnosis not present

## 2024-06-10 DIAGNOSIS — I10 Essential (primary) hypertension: Secondary | ICD-10-CM | POA: Diagnosis not present

## 2024-06-10 DIAGNOSIS — D500 Iron deficiency anemia secondary to blood loss (chronic): Secondary | ICD-10-CM | POA: Diagnosis not present

## 2024-06-10 DIAGNOSIS — K56609 Unspecified intestinal obstruction, unspecified as to partial versus complete obstruction: Secondary | ICD-10-CM | POA: Diagnosis not present

## 2024-06-10 MED ORDER — FAMOTIDINE 20 MG PO TABS
20.0000 mg | ORAL_TABLET | Freq: Once | ORAL | Status: AC
  Administered 2024-06-10: 20 mg via ORAL
  Filled 2024-06-10: qty 1

## 2024-06-10 NOTE — Progress Notes (Signed)
 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  Regina Taylor was admitted on 19 January in the setting of abdominal discomfort and nausea.  Thus far her workup has revealed partial obstruction of her colon worrisome for potential colon cancer.  The palliative care team has been asked to support additional goals of care conversations.   Today's Discussion 06/10/2024  I reviewed the chart notes including nursing notes from [facility] and [employer], progress notes from [employer] - Surgery PA, Dr. Darci, [facility] - TOC, . I also reviewed vital signs - SBP with slight elevation this morning, remains on RA, , nursing flowsheets, pain level 3-7/10, medication administrations record - 5mg  of IV dilaudid  in the last 24 hours, no labs this morning to review. KUB from yesterday reviewed which does show movement of contrast.  Per TOC note review patients family has elected to proceed with receiving information from [facility].   I met with Regina Taylor at bedside this morning in the company of her son, Regina Taylor. Dr. Darci was present.   Uchenna is awake and alert. The NGT has been removed. She shares that she does have a bandlike pain across her abdomen after ingestion. She reports some improvement in her feelings of nausea. Created space and opportunity for patient to explore thoughts feelings and fears regarding current medical situation. Patient shares that she is aware of her current circumstances and situation. She is hopeful that she will be able to get OOB today. I shared that I would speak to the nursing staff to support this request.   Patients son shares the plan for dietary modifications per patients tolerance. He  notes the desire to speak to [facility]. I shared that I would reach out this morning so further conversations could occur to solidify the discharge plan.   Questions and concerns addressed/Palliative Support Provided.   Objective Assessment: Vital Signs Vitals:   06/09/24 2030 06/10/24 0534  BP: (!) 159/58 (!) 150/61  Pulse: 74 67  Resp: 18 18  Temp: 98.4 F (36.9 C) 98.1 F (36.7 C)  SpO2: 96% 94%    Intake/Output Summary (Last 24 hours) at 06/10/2024 9157 Last data filed at 06/10/2024 0500 Gross per 24 hour  Intake 300 ml  Output 800 ml  Net -500 ml   Last Weight  Most recent update: 06/08/2024  2:35 PM    Weight  70.5 kg (155 lb 6.8 oz)            Gen:  Elderly Causasian F chronically ill in appearance HEENT: Dry mucous membranes CV: Regular rate and rhythm  PULM: On RA - breathing even and nonlabored ABD: Distended, (+) tenderness on deep palpation EXT: No edema  Neuro: Alert and oriented x3   SUMMARY OF RECOMMENDATIONS   DNAR/DNI   Continue present care allowing time for outcomes  Appreciate [facility] with [name] and her son  Plan for transition home with hospice once medically optimized   The PMT will continue to follow along for additional support  Code Status/Advance Care Planning: DNAR/DNI   Symptom Management:  Pain:  - Dilaudid  0.5mg  - 1mg  IVP Q2H PRN   Nausea: - Zofran  4mg  IV Q6H PRN --> Of note Qtc was 463 (1/19) ______________________________________________________________________________________  Regina Taylor Palliative Medicine Team Team Cell Phone: [phone] Please utilize secure chat with additional questions, if there is no response within 30 minutes please call the above phone number  I personally spent a total of 35 minutes in the care of the patient today including preparing to see the patient, performing a medically appropriate exam/evaluation, counseling and educating, placing  orders, referring and communicating with other health care professionals, documenting clinical information in the EHR, and coordinating care.     "

## 2024-06-10 NOTE — Plan of Care (Signed)
" °  Problem: Clinical Measurements: Goal: Ability to maintain clinical measurements within normal limits will improve Outcome: Progressing Goal: Will remain free from infection Outcome: Progressing Goal: Diagnostic test results will improve Outcome: Progressing Goal: Respiratory complications will improve Outcome: Progressing   Problem: Coping: Goal: Level of anxiety will decrease Outcome: Progressing   Problem: Pain Managment: Goal: General experience of comfort will improve and/or be controlled Outcome: Progressing   Problem: Safety: Goal: Ability to remain free from injury will improve Outcome: Progressing   Problem: Skin Integrity: Goal: Risk for impaired skin integrity will decrease Outcome: Progressing   Problem: Nutrition: Goal: Adequate nutrition will be maintained Outcome: Not Progressing   Problem: Elimination: Goal: Will not experience complications related to bowel motility Outcome: Not Progressing   "

## 2024-06-10 NOTE — Progress Notes (Signed)
 " Progress Note   Patient: Regina Taylor DOB: Dec 17, 1927 DOA: 06/08/2024     2 DOS: the patient was seen and examined on 06/10/2024   Brief hospital course:  Regina Taylor is a 89 y.o. female with medical history significant for glaucoma, macular degeneration, atrial fibrillation on Eliquis  being admitted to the hospital with concern for obstructing colon cancer.  Patient has been experiencing vague abdominal discomfort, nausea.  Started having vomiting presented to ER.  CT abdomen pelvis findings are highly concerning for colon cancer and proximal transverse colon and associated colonic obstruction.  NG tube placed in the ED, GI and general surgery consulted, admitted to [facility] service for further management evaluation.  Assessment and Plan: Obstructing colon mass- CT evidence of proximal transverse colon mass with obstruction- GI and general surgery advised conservative management, did not recommend any further workup as she does not wish aggressive interventions including colonoscopy as she understands the risks involved due to her age and comorbid conditions. Palliative team on board discussed about symptom management, home with hospice. NG tube removed, able to tolerate clears, continue pain control, antiemetics. Discussed with patient, son about the plan.  Chronic iron deficiency anemia: Stable  Paroxysmal A-fib: Resumed home amiodarone , Toprol , Eliquis  therapy.  Essential hypertension: Continue home medications ARB, Toprol , Cardizem .  Glaucoma-Home eyedrops resumed.     Out of bed to chair. Incentive spirometry. Nursing supportive care. Fall, aspiration precautions. Diet:  Diet Orders (From admission, onward)     Start     Ordered   06/10/24 0818  Diet clear liquid Room service appropriate? Yes; Fluid consistency: Thin  Diet effective now       Question Answer Comment  Room service appropriate? Yes   Fluid consistency: Thin      06/10/24 0817            DVT prophylaxis:  apixaban  (ELIQUIS ) tablet 5 mg  Level of care: Med-Surg   Code Status: Limited: Do not attempt resuscitation (DNR) -DNR-LIMITED -Do Not Intubate/DNI   Subjective: Patient is seen and examined today morning. NG tube out. Reported no nausea. Has mild abdominal discomfort.  Able to tolerate clears.  Physical Exam: Vitals:   06/09/24 0211 06/09/24 1316 06/09/24 2030 06/10/24 0534  BP: (!) 149/62 (!) 149/56 (!) 159/58 (!) 150/61  Pulse: 68 74 74 67  Resp: 18 16 18 18   Temp: 97.6 F (36.4 C)  98.4 F (36.9 C) 98.1 F (36.7 C)  TempSrc: Oral  Oral Oral  SpO2: 95% 97% 96% 94%  Weight:      Height:        General - Elderly Caucasian female, no apparent distress HEENT - PERRLA, EOMI, atraumatic head, non tender sinuses. Lung - Clear, basal rales, no rhonchi, wheezes. Heart - S1, S2 heard, no murmurs, rubs, trace pedal edema. Abdomen - Soft, non tender, bowel sounds decreased Neuro - Alert, awake and oriented x 3, non focal exam. Skin - Warm and dry.  Data Reviewed:      Latest Ref Rng & Units 06/09/2024    5:41 AM 06/08/2024    9:40 AM 05/05/2024    5:00 PM  CBC  WBC 4.0 - 10.5 K/uL 6.3  7.3  7.2   Hemoglobin 12.0 - 15.0 g/dL 89.3  88.7  9.7   Hematocrit 36.0 - 46.0 % 33.0  35.4  30.0   Platelets 150 - 400 K/uL 281  301  274       Latest Ref Rng & Units 06/09/2024  5:41 AM 06/08/2024    9:40 AM 05/05/2024    5:00 PM  BMP  Glucose 70 - 99 mg/dL 871  852  891   BUN 8 - 23 mg/dL 24  23  33   Creatinine 0.44 - 1.00 mg/dL 9.31  9.24  8.93   BUN/Creat Ratio 12 - 28   31   Sodium 135 - 145 mmol/L 140  136  140   Potassium 3.5 - 5.1 mmol/L 3.9  4.3  4.8   Chloride 98 - 111 mmol/L 108  100  106   CO2 22 - 32 mmol/L 22  24  21    Calcium  8.9 - 10.3 mg/dL 9.3  9.3  9.8    DG Abd Portable 1V-Small Bowel Obstruction Protocol-24 hr delay Result Date: 06/09/2024 EXAM: 1 VIEW XRAY OF THE ABDOMEN 06/09/2024 10:43:00 PM COMPARISON: Plate abdomen study earlier  today at 6:30 am. CLINICAL HISTORY: Encounter for imaging study to confirm nasogastric (NG) tube placement. FINDINGS: BOWEL: The enteric contrast has progressed within the colon to the mid descending segment. There is contrast opacification of dilated abdominal and normal caliber pelvic small bowel with maximum small bowel caliber again 4 cm left lower abdomen. SOFT TISSUES: No abnormal calcifications. NGT is in place terminating in the body of the stomach. BONES: No acute fracture. IMPRESSION: 1. NG tube in place terminating in the body of the stomach. 2. Dilated abdominal small bowel with maximum caliber 4 cm in the left lower abdomen, unchanged . 3. Contrast progressed within the colon to the mid descending segment . Electronically signed by: Francis Quam MD 06/09/2024 10:54 PM EST RP Workstation: [id number]   DG Abd Portable 1V-Small Bowel Obstruction Protocol-initial, 8 hr delay Result Date: 06/09/2024 CLINICAL DATA:  Small bowel obstruction.  Protocol. EXAM: PORTABLE ABDOMEN - 1 VIEW COMPARISON:  06/08/2024 at 1422 hours. FINDINGS: NG tube tip is in the distal stomach in the pyloric region. Distended small bowel again noted measuring up to 3.7 cm diameter. Dilated opacified small bowel is seen in the left abdomen and pelvis. Right colon is distended and opacified. The right colon is noted to be fluid-filled and distended on CT imaging yesterday. Appearance today may reflect some opacification of the right colonic fluid although attenuation seen on the current x-ray may simply reflect colonic fluid. IMPRESSION: 1. Persistent small bowel dilatation with opacified distended small bowel in the left abdomen and pelvis. 2. Right colon is distended and relatively dense. The right colon was noted to be fluid-filled and distended on CT imaging yesterday. Appearance on today's x-ray suggests migration of contrast into the right colonic fluid. Electronically Signed   By: Camellia Candle M.D.   On: 06/09/2024 06:52    DG Abd Portable 1V-Small Bowel Protocol-Position Verification Result Date: 06/08/2024 EXAM: 1 VIEW XRAY OF THE ABDOMEN 06/08/2024 02:26:00 PM COMPARISON: 06/08/2024 CLINICAL HISTORY: Encounter for imaging study to confirm nasogastric (NG) tube placement FINDINGS: LINES, TUBES AND DEVICES: Enteric tube in place with distal tip and side port terminating within the expected location of the gastric body. BOWEL: Nonobstructive bowel gas pattern. SOFT TISSUES: No abnormal calcifications. BONES: No acute fracture. IMPRESSION: 1. Enteric tube tip and side port terminate within the expected location of the gastric body. Electronically signed by: Greig Pique MD 06/08/2024 03:18 PM EST RP Workstation: [id number]   DG Abd Portable 1 View Result Date: 06/08/2024 CLINICAL DATA:  Nasogastric tube placement. EXAM: PORTABLE ABDOMEN - 1 VIEW COMPARISON:  None. FINDINGS: Nasogastric tube terminates in the gastric antrum.  Unremarkable bowel gas pattern. IMPRESSION: Nasogastric tube terminates in the gastric antrum. Electronically Signed   By: Newell Eke M.D.   On: 06/08/2024 11:16    Family Communication: Discussed with patient, son at bedside. They understand and agree. All questions answered.  Disposition: Status is: Inpatient Remains inpatient appropriate because: advanced diet, pain control  Planned Discharge Destination: Home with Home Health     Time spent: 45 minutes  Author: Concepcion Riser, MD 06/10/2024 9:50 AM Secure chat 7am to 7pm For on call review [email or web address].    "

## 2024-06-10 NOTE — Progress Notes (Signed)
 "  Progress Note     Subjective: Patient still having some abdominal pain of central abdomen. She denies worsening pain. Has had some sips/chips of ice without concerns. Denies BM. Having flatulence. Denies n/v.   ROS  All negative with the exception of above.  Objective: Vital signs in last 24 hours: Temp:  [98.1 F (36.7 C)-98.4 F (36.9 C)] 98.1 F (36.7 C) (01/21 0534) Pulse Rate:  [67-74] 67 (01/21 0534) Resp:  [16-18] 18 (01/21 0534) BP: (149-159)/(56-61) 150/61 (01/21 0534) SpO2:  [94 %-97 %] 94 % (01/21 0534) Last BM Date : 06/07/24  Intake/Output from previous day: 01/20 0701 - 01/21 0700 In: 300 [P.O.:240; NG/GT:60] Out: 800 [Urine:100; Emesis/NG output:700] Intake/Output this shift: No intake/output data recorded.  PE: General: Pleasant female who is laying in bed in NAD. HEENT: Head is normocephalic, atraumatic. NGT present. Heart: HR normal during encounter.  Lungs: Respiratory effort nonlabored on room air.  Abd: Soft, ND. Mild tenderness to palpation of central abdomen. No rebound tenderness or guarding.  MS: Able to move all 4 extremities.  Skin: Warm and dry.  Psych: A&Ox3 with an appropriate affect.      Lab Results:  Recent Labs    06/08/24 0940 06/09/24 0541  WBC 7.3 6.3  HGB 11.2* 10.6*  HCT 35.4* 33.0*  PLT 301 281   BMET Recent Labs    06/08/24 0940 06/09/24 0541  NA 136 140  K 4.3 3.9  CL 100 108  CO2 24 22  GLUCOSE 147* 128*  BUN 23 24*  CREATININE 0.75 0.68  CALCIUM  9.3 9.3   PT/INR No results for input(s): LABPROT, INR in the last 72 hours. CMP     Component Value Date/Time   NA 140 06/09/2024 0541   NA 140 05/05/2024 1700   K 3.9 06/09/2024 0541   CL 108 06/09/2024 0541   CO2 22 06/09/2024 0541   GLUCOSE 128 (H) 06/09/2024 0541   BUN 24 (H) 06/09/2024 0541   BUN 33 05/05/2024 1700   CREATININE 0.68 06/09/2024 0541   CALCIUM  9.3 06/09/2024 0541   PROT 7.5 06/08/2024 0940   PROT 7.1 10/23/2021 1031    ALBUMIN 4.2 06/08/2024 0940   ALBUMIN 4.2 10/23/2021 1031   AST 18 06/08/2024 0940   ALT 12 06/08/2024 0940   ALKPHOS 86 06/08/2024 0940   BILITOT 0.4 06/08/2024 0940   BILITOT 0.2 10/23/2021 1031   GFRNONAA >60 06/09/2024 0541   GFRAA 71 06/01/2020 1156   Lipase     Component Value Date/Time   LIPASE 12 06/08/2024 0940       Studies/Results: DG Abd Portable 1V-Small Bowel Obstruction Protocol-24 hr delay Result Date: 06/09/2024 EXAM: 1 VIEW XRAY OF THE ABDOMEN 06/09/2024 10:43:00 PM COMPARISON: Plate abdomen study earlier today at 6:30 am. CLINICAL HISTORY: Encounter for imaging study to confirm nasogastric (NG) tube placement. FINDINGS: BOWEL: The enteric contrast has progressed within the colon to the mid descending segment. There is contrast opacification of dilated abdominal and normal caliber pelvic small bowel with maximum small bowel caliber again 4 cm left lower abdomen. SOFT TISSUES: No abnormal calcifications. NGT is in place terminating in the body of the stomach. BONES: No acute fracture. IMPRESSION: 1. NG tube in place terminating in the body of the stomach. 2. Dilated abdominal small bowel with maximum caliber 4 cm in the left lower abdomen, unchanged . 3. Contrast progressed within the colon to the mid descending segment . Electronically signed by: Francis Quam MD 06/09/2024 10:54 PM  EST RP Workstation: [id number]   DG Abd Portable 1V-Small Bowel Obstruction Protocol-initial, 8 hr delay Result Date: 06/09/2024 CLINICAL DATA:  Small bowel obstruction.  Protocol. EXAM: PORTABLE ABDOMEN - 1 VIEW COMPARISON:  06/08/2024 at 1422 hours. FINDINGS: NG tube tip is in the distal stomach in the pyloric region. Distended small bowel again noted measuring up to 3.7 cm diameter. Dilated opacified small bowel is seen in the left abdomen and pelvis. Right colon is distended and opacified. The right colon is noted to be fluid-filled and distended on CT imaging yesterday. Appearance today may  reflect some opacification of the right colonic fluid although attenuation seen on the current x-ray may simply reflect colonic fluid. IMPRESSION: 1. Persistent small bowel dilatation with opacified distended small bowel in the left abdomen and pelvis. 2. Right colon is distended and relatively dense. The right colon was noted to be fluid-filled and distended on CT imaging yesterday. Appearance on today's x-ray suggests migration of contrast into the right colonic fluid. Electronically Signed   By: Camellia Candle M.D.   On: 06/09/2024 06:52   DG Abd Portable 1V-Small Bowel Protocol-Position Verification Result Date: 06/08/2024 EXAM: 1 VIEW XRAY OF THE ABDOMEN 06/08/2024 02:26:00 PM COMPARISON: 06/08/2024 CLINICAL HISTORY: Encounter for imaging study to confirm nasogastric (NG) tube placement FINDINGS: LINES, TUBES AND DEVICES: Enteric tube in place with distal tip and side port terminating within the expected location of the gastric body. BOWEL: Nonobstructive bowel gas pattern. SOFT TISSUES: No abnormal calcifications. BONES: No acute fracture. IMPRESSION: 1. Enteric tube tip and side port terminate within the expected location of the gastric body. Electronically signed by: Greig Pique MD 06/08/2024 03:18 PM EST RP Workstation: [id number]   DG Abd Portable 1 View Result Date: 06/08/2024 CLINICAL DATA:  Nasogastric tube placement. EXAM: PORTABLE ABDOMEN - 1 VIEW COMPARISON:  None. FINDINGS: Nasogastric tube terminates in the gastric antrum. Unremarkable bowel gas pattern. IMPRESSION: Nasogastric tube terminates in the gastric antrum. Electronically Signed   By: Newell Eke M.D.   On: 06/08/2024 11:16   CT ABDOMEN PELVIS WO CONTRAST Result Date: 06/08/2024 CLINICAL DATA:  Nausea, vomiting and poor appetite. Bowel obstruction suspected. EXAM: CT ABDOMEN AND PELVIS WITHOUT CONTRAST TECHNIQUE: Multidetector CT imaging of the abdomen and pelvis was performed following the standard protocol without IV  contrast. RADIATION DOSE REDUCTION: This exam was performed according to the departmental dose-optimization program which includes automated exposure control, adjustment of the mA and/or kV according to patient size and/or use of iterative reconstruction technique. COMPARISON:  Abdominopelvic CT 12/04/2021. FINDINGS: Lower chest: Scattered small nodules at both lung bases are stable from previous CT, consistent with benign findings. No significant pleural effusion. Stable small hiatal hernia. Hepatobiliary: The liver has a non cirrhotic morphology without suspicious focal abnormality on noncontrast imaging. There are calcified gallstones without evidence of gallbladder wall thickening, surrounding inflammation or biliary ductal dilatation. Pancreas: Unremarkable. No pancreatic ductal dilatation or surrounding inflammatory changes. Spleen: Normal in size without focal abnormality. Adrenals/Urinary Tract: Both adrenal glands appear normal. No evidence of urinary tract calculus, hydronephrosis or perinephric soft tissue stranding. Simple appearing cyst in the lower pole of the left kidney measuring 3.2 cm, not well seen previously secondary to motion artifact; no specific follow-up imaging recommended. The bladder appears unremarkable for its degree of distention although is partially obscured by artifact from the right hip arthroplasty. Stomach/Bowel: Possible minimal enteric contrast within the stomach and proximal small bowel. The stomach is decompressed. There are multiple mildly dilated and fluid-filled loops of  mid to distal small bowel. In addition, the proximal colon is distended and fluid-filled. There is concern of an obstructing apple-core lesion in the proximal transverse colon measuring up to 4.5 cm in length on coronal image 62/5. This appearance is highly concerning for colon cancer. The distal colon appears decompressed. There are scattered mild diverticular changes within the descending and sigmoid  colon. Vascular/Lymphatic: Mild soft tissue stranding around the suspected transverse colon mass. No enlarged abdominopelvic lymph nodes are identified. Aortic and branch vessel atherosclerosis without evidence of aneurysm. Reproductive: The uterus and ovaries appear unremarkable. No adnexal mass. Other: No ascites or peritoneal nodularity. No evidence of abdominal wall hernia. Musculoskeletal: No acute osseous findings. Status post right total hip arthroplasty with stable 2.5 cm fluid collection along the right iliopsoas tendon (image 64/2), likely bursitis. Progressive severe left hip arthropathy with partial collapse of the left femoral head. Relatively mild spondylosis with a convex right scoliosis. IMPRESSION: 1. Findings are highly concerning for colon cancer in the proximal transverse colon with associated colonic obstruction. Recommend further evaluation with colonoscopy. 2. No evidence of metastatic disease. 3. Cholelithiasis without evidence of cholecystitis or biliary ductal dilatation. 4. Progressive severe left hip arthropathy with partial collapse of the left femoral head. 5.  Aortic Atherosclerosis (ICD10-I70.0). 6. These results were called by telephone at the time of interpretation on 06/08/2024 at 9:54 am to provider [facility] , who verbally acknowledged these results. Electronically Signed   By: Elsie Perone M.D.   On: 06/08/2024 09:54    Anti-infectives: Anti-infectives (From admission, onward)    None        Assessment/Plan 89 year old female with abdominal pain, decreased appetite, nausea/vomiting and CT concerning for malignancy with partial colonic obstruction at the level of the proximal transverse colon.  -Afebrile. -Labs crom 1/20: WBC  6.3; HGB 10.6 from 11.2 -Pt underwent small bowel protocol. 8 hr xray showing contrast in R colonic fluid. 24 hr film with contrast in the colon to the mid descending segment. -Pain manageable at this time. No nausea/vomiting.  currently. No BM. Reports flatulence. -Will discontinue NGT and initiate CLD. Patient reports if she were to have n/v, abdominal distention after removal of NGT, she does not desire that it be replaced. -Patient continues to express that she does not desire surgical intervention regaurding concern of malignancy/colonic obstruction.  -Appreciate palliative care recommendations and consult. -General surgery will sign off. Please call back for further questions or concerns.    FEN: CLD; Discontinue NGT; IVF per primary team VTE: Eliquis  ID: None currently.   LOS: 2 days   I reviewed specialist notes, consulting provider notes, hospitalist notes, nursing notes, last 24 h vitals and pain scores, last 48 h intake and output, last 24 h labs and trends, and last 24 h imaging results.  This care required moderate level of medical decision making.   Marjorie Carlyon Favre, [facility] 06/10/2024, 8:09 AM Please see Amion for pager number during day hours 7:00am-4:30pm  "

## 2024-06-11 ENCOUNTER — Other Ambulatory Visit (HOSPITAL_COMMUNITY): Payer: Self-pay

## 2024-06-11 DIAGNOSIS — K56609 Unspecified intestinal obstruction, unspecified as to partial versus complete obstruction: Secondary | ICD-10-CM | POA: Diagnosis not present

## 2024-06-11 DIAGNOSIS — I10 Essential (primary) hypertension: Secondary | ICD-10-CM | POA: Diagnosis not present

## 2024-06-11 DIAGNOSIS — D500 Iron deficiency anemia secondary to blood loss (chronic): Secondary | ICD-10-CM | POA: Diagnosis not present

## 2024-06-11 DIAGNOSIS — I480 Paroxysmal atrial fibrillation: Secondary | ICD-10-CM | POA: Diagnosis not present

## 2024-06-11 MED ORDER — POLYETHYLENE GLYCOL 3350 17 GM/SCOOP PO POWD
17.0000 g | Freq: Every day | ORAL | 0 refills | Status: AC | PRN
  Filled 2024-06-11: qty 238, 14d supply, fill #0

## 2024-06-11 MED ORDER — OXYCODONE HCL 5 MG PO TABS
5.0000 mg | ORAL_TABLET | ORAL | 0 refills | Status: DC | PRN
  Filled 2024-06-11: qty 30, 5d supply, fill #0

## 2024-06-11 MED ORDER — SENNOSIDES-DOCUSATE SODIUM 8.6-50 MG PO TABS
1.0000 | ORAL_TABLET | Freq: Two times a day (BID) | ORAL | 1 refills | Status: AC
  Filled 2024-06-11: qty 60, 30d supply, fill #0

## 2024-06-11 MED ORDER — FAMOTIDINE 20 MG PO TABS
20.0000 mg | ORAL_TABLET | Freq: Every day | ORAL | Status: DC
  Administered 2024-06-11 – 2024-06-12 (×2): 20 mg via ORAL
  Filled 2024-06-11 (×2): qty 1

## 2024-06-11 MED ORDER — HYDROMORPHONE HCL 2 MG PO TABS: 1.0000 mg | ORAL_TABLET | ORAL | Status: DC | PRN

## 2024-06-11 MED ORDER — HYDROMORPHONE HCL 2 MG PO TABS
2.0000 mg | ORAL_TABLET | ORAL | Status: DC | PRN
  Administered 2024-06-11 – 2024-06-12 (×3): 2 mg via ORAL
  Filled 2024-06-11 (×3): qty 1

## 2024-06-11 MED ORDER — ONDANSETRON 4 MG PO TBDP
4.0000 mg | ORAL_TABLET | Freq: Three times a day (TID) | ORAL | 0 refills | Status: AC | PRN
  Filled 2024-06-11: qty 20, 7d supply, fill #0

## 2024-06-11 NOTE — Plan of Care (Signed)

## 2024-06-11 NOTE — Progress Notes (Signed)
 "                                                                                                                                                                                                         Palliative Medicine Progress Note   Patient Name: Regina Taylor       Date: 06/11/2024 DOB: 06-10-1927  Age: 89 y.o. 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  Regina Taylor was admitted on 19 January in the setting of abdominal discomfort and nausea.  Thus far her workup has revealed partial obstruction of her colon worrisome for potential colon cancer.  The palliative care team has been asked to support additional goals of care conversations.   Subjective: Chart reviewed. Per MAR, in the past 24 hours patient has received PRN dilaudid  IV 1 mg x 4 doses. She has not required any zofran  in the past 24 hours.  Bedside visit. Patient is out of bed to the recliner. She reports she is tolerating clear liquids. She reports ongoing abdominal pain, partially relieved with dilaudid  but she thinks she needs and could tolerates a stronger dose.   Son is at bedside and has several questions about how patient's medications will be managed under hospice care. We discussed the goal is to minimize medications if possible, but that cardiac medications (to control rate and/or rhythm) are typically continued. Son also has concerns about patient's recent decline in functional status and is requesting a PT evaluation to help determine how she can best mobilize at home.    Objective:  Physical Exam Vitals reviewed.  Constitutional:       General: She is not in acute distress.    Comments: Chronically ill-appearing  Pulmonary:     Effort: Pulmonary effort is normal.  Neurological:     Mental Status: She is alert and oriented to person, place, and time.                Palliative Medicine Assessment & Plan   Assessment: Principal Problem:   Colonic obstruction (HCC) Active Problems:   Colonic mass    Recommendations/Plan: Dilaudid  2 mg po every 4 hours as needed for pain (only use IV dilaudid  for severe pain not relieved by oral dilaudid ) Continue zofran  as needed for  nausea PT evaluation per family request Plan to discharge home with hospice, likely tomorrow  Code Status: DNR limited   Prognosis:  < 6 months   Care plan was discussed with Dr. Darci, [facility], RN  Thank you for allowing the Palliative Medicine Team to assist in the care of this patient.   I personally spent a total of 35 minutes in the care of the patient today including preparing to see the patient, getting/reviewing separately obtained history, performing a medically appropriate exam/evaluation, counseling and educating, placing orders, documenting clinical information in the EHR, and coordinating care.   Signed: Teniola Tseng B Yunuen Mordan, NP   Please contact Palliative Medicine Team phone at [phone] for questions and concerns.  For individual providers, please see AMION.      "

## 2024-06-11 NOTE — Progress Notes (Signed)
 " Progress Note   Patient: Regina Taylor FMW:[id number] DOB: Oct 09, 1927 DOA: 06/08/2024     3 DOS: the patient was seen and examined on 06/11/2024   Brief hospital course:  SENYA HINZMAN is a 89 y.o. female with medical history significant for glaucoma, macular degeneration, atrial fibrillation on Eliquis  being admitted to the hospital with concern for obstructing colon cancer.  Patient has been experiencing vague abdominal discomfort, nausea.  Started having vomiting presented to ER.  CT abdomen pelvis findings are highly concerning for colon cancer and proximal transverse colon and associated colonic obstruction.  NG tube placed in the ED, GI and general surgery consulted, admitted to [facility] service for further management evaluation.  Assessment and Plan: Obstructing colon mass- CT evidence of proximal transverse colon mass with obstruction- GI and general surgery advised conservative management, did not recommend any further workup as she does not wish aggressive interventions including colonoscopy as she understands the risks involved due to her age and comorbid conditions. Palliative team on board discussed about symptom management, home with hospice. NG tube removed 06/10/24, able to tolerate soft diet. Plan to discharge once pain is better controlled on oral oxycodone . TOC working on health services for discharge.  Possible discharge tomorrow  Chronic iron deficiency anemia: Stable  Paroxysmal A-fib: Resumed home amiodarone , Toprol , Eliquis  therapy.  Essential hypertension: Continue home medications ARB, Toprol , Cardizem .  Glaucoma-Home eyedrops resumed.     Out of bed to chair. Incentive spirometry. Nursing supportive care. Fall, aspiration precautions. Diet:  Diet Orders (From admission, onward)     Start     Ordered   06/11/24 0920  DIET SOFT Room service appropriate? Yes; Fluid consistency: Thin  Diet effective now       Question Answer Comment  Room service  appropriate? Yes   Fluid consistency: Thin      06/11/24 0919           DVT prophylaxis:  apixaban  (ELIQUIS ) tablet 5 mg  Level of care: Med-Surg   Code Status: Limited: Do not attempt resuscitation (DNR) -DNR-LIMITED -Do Not Intubate/DNI   Subjective: Patient is seen and examined today morning.  She is able to tolerate clears.  Mild abdominal discomfort.  No nausea.  Wishes to go home tomorrow if pain better controlled.  Physical Exam: Vitals:   06/10/24 1358 06/10/24 2017 06/11/24 0623 06/11/24 1357  BP: (!) 171/65 (!) 149/67 (!) 129/54 (!) 125/48  Pulse: 65 66 (!) 57 (!) 54  Resp:  15 16 20   Temp: 98 F (36.7 C) 98 F (36.7 C) 97.7 F (36.5 C) 97.7 F (36.5 C)  TempSrc:    Oral  SpO2: 94%  90% 95%  Weight:      Height:        General - Elderly Caucasian female, no apparent distress HEENT - PERRLA, EOMI, atraumatic head, non tender sinuses. Lung - Clear, basal rales, no rhonchi, wheezes. Heart - S1, S2 heard, no murmurs, rubs, trace pedal edema. Abdomen - Soft, non tender, bowel sounds decreased Neuro - Alert, awake and oriented x 3, non focal exam. Skin - Warm and dry.  Data Reviewed:      Latest Ref Rng & Units 06/09/2024    5:41 AM 06/08/2024    9:40 AM 05/05/2024    5:00 PM  CBC  WBC 4.0 - 10.5 K/uL 6.3  7.3  7.2   Hemoglobin 12.0 - 15.0 g/dL 89.3  88.7  9.7   Hematocrit 36.0 - 46.0 % 33.0  35.4  30.0   Platelets 150 - 400 K/uL 281  301  274       Latest Ref Rng & Units 06/09/2024    5:41 AM 06/08/2024    9:40 AM 05/05/2024    5:00 PM  BMP  Glucose 70 - 99 mg/dL 871  852  891   BUN 8 - 23 mg/dL 24  23  33   Creatinine 0.44 - 1.00 mg/dL 9.31  9.24  8.93   BUN/Creat Ratio 12 - 28   31   Sodium 135 - 145 mmol/L 140  136  140   Potassium 3.5 - 5.1 mmol/L 3.9  4.3  4.8   Chloride 98 - 111 mmol/L 108  100  106   CO2 22 - 32 mmol/L 22  24  21    Calcium  8.9 - 10.3 mg/dL 9.3  9.3  9.8    DG Abd Portable 1V-Small Bowel Obstruction Protocol-24 hr  delay Result Date: 06/09/2024 EXAM: 1 VIEW XRAY OF THE ABDOMEN 06/09/2024 10:43:00 PM COMPARISON: Plate abdomen study earlier today at 6:30 am. CLINICAL HISTORY: Encounter for imaging study to confirm nasogastric (NG) tube placement. FINDINGS: BOWEL: The enteric contrast has progressed within the colon to the mid descending segment. There is contrast opacification of dilated abdominal and normal caliber pelvic small bowel with maximum small bowel caliber again 4 cm left lower abdomen. SOFT TISSUES: No abnormal calcifications. NGT is in place terminating in the body of the stomach. BONES: No acute fracture. IMPRESSION: 1. NG tube in place terminating in the body of the stomach. 2. Dilated abdominal small bowel with maximum caliber 4 cm in the left lower abdomen, unchanged . 3. Contrast progressed within the colon to the mid descending segment . Electronically signed by: Francis Quam MD 06/09/2024 10:54 PM EST RP Workstation: [id number]    Family Communication: Discussed with patient, son at bedside. They understand and agree. All questions answered.  Disposition: Status is: Inpatient Remains inpatient appropriate because: advanced diet, pain control  Planned Discharge Destination: Home with Home Health     Time spent: 44 minutes  Author: Concepcion Riser, MD 06/11/2024 3:46 PM Secure chat 7am to 7pm For on call review [email or web address].    "

## 2024-06-12 ENCOUNTER — Other Ambulatory Visit (HOSPITAL_COMMUNITY): Payer: Self-pay

## 2024-06-12 MED ORDER — LORAZEPAM 0.5 MG PO TABS
0.5000 mg | ORAL_TABLET | ORAL | 0 refills | Status: AC | PRN
  Filled 2024-06-12: qty 42, 10d supply, fill #0

## 2024-06-12 MED ORDER — OXYCODONE HCL 5 MG PO TABS: 5.0000 mg | ORAL_TABLET | ORAL | 0 refills | Status: AC | PRN

## 2024-06-12 MED ORDER — HYDROMORPHONE HCL 2 MG PO TABS
2.0000 mg | ORAL_TABLET | ORAL | 0 refills | Status: AC | PRN
  Filled 2024-06-12: qty 30, 4d supply, fill #0

## 2024-06-12 NOTE — Discharge Summary (Signed)
 " Physician Discharge Summary   Patient: Regina Taylor MRN: [id number] DOB: 10/05/1927  Admit date:     06/08/2024  Discharge date: 06/12/24  Discharge Physician: Amaryllis Dare   PCP: Claudene Pellet, MD   Recommendations at discharge:  Patient is being discharged home with hospice.  Poor prognosis.  Discharge Diagnoses: Principal Problem:   Colonic obstruction (HCC) Active Problems:   Atrial fibrillation with RVR (HCC)   Colonic mass  Resolved Problems:   * No resolved hospital problems. *  Hospital Course: Regina Taylor is a 89 y.o. female with medical history significant for glaucoma, macular degeneration, atrial fibrillation on Eliquis  being admitted to the hospital with concern for obstructing colon cancer.  Patient has been experiencing vague abdominal discomfort, nausea.  Started having vomiting presented to ER.  CT abdomen pelvis findings are highly concerning for colon cancer and proximal transverse colon and associated colonic obstruction.  NG tube placed in the ED, GI and general surgery consulted.  As patient does not want to be aggressive or further investigation.  GI and general surgery advised conservative management.  Palliative care was also consulted.  NG tube was removed on 06/10/2024 and patient was able to tolerate soft diet.  Her pain is currently being managed with oxycodone  and p.o. Dilaudid  and seems to be under controlled.  No nausea or vomiting.  Had a small bowel movement since in the hospital.  Patient is being discharged home with hospice and will continue her current medications.  She was advised to avoid constipation if possible as it can exacerbate her symptoms.  She is being discharged with Zofran , p.o. Dilaudid  and oxycodone  to use as needed at home for pain.  She will continue with rest of her home medications and hospice will provide further assistance for symptom management and end-of-life care.   Pain control - Regina Taylor  Controlled Substance  Reporting System database was reviewed. and patient was instructed, not to drive, operate heavy machinery, perform activities at heights, swimming or participation in water activities or provide baby-sitting services while on Pain, Sleep and Anxiety Medications; until their outpatient Physician has advised to do so again. Also recommended to not to take more than prescribed Pain, Sleep and Anxiety Medications.  Consultants: Gastroenterology.  General surgery.  Palliative care Procedures performed: None Disposition: Hospice care Diet recommendation:  Discharge Diet Orders (From admission, onward)     Start     Ordered   06/11/24 0000  Diet - low sodium heart healthy        06/11/24 1332           Dysphagia type 3 thin Liquid DISCHARGE MEDICATION: Allergies as of 06/12/2024       Reactions   Shellfish Protein-containing Drug Products Anaphylaxis   Iodinated Contrast Media Nausea And Vomiting        Medication List     TAKE these medications    acetaminophen  325 MG tablet Commonly known as: TYLENOL  Take 650 mg by mouth every 6 (six) hours as needed for mild pain (pain score 1-3).   amiodarone  100 MG tablet Commonly known as: Pacerone  Take 1 tablet daily and hold on Sundays   Bion Tears PF 0.1-0.3 % Soln Generic drug: Dextran 70-Hypromellose (PF) Place 1 drop into the right eye daily as needed (Eye comfort).   Cosopt  PF 2-0.5 % Soln Generic drug: Dorzolamide  HCl-Timolol  Mal PF Place 1 drop into the right eye 2 (two) times daily.   diltiazem  240 MG 24 hr capsule Commonly known  as: CARDIZEM  CD Take 1 capsule (240 mg total) by mouth daily.   Eliquis  5 MG Tabs tablet Generic drug: apixaban  TAKE ONE TABLET BY MOUTH TWICE DAILY   furosemide  40 MG tablet Commonly known as: LASIX  Take 1 tablet daily for 3 days, then take 1 TAB DAILY (40 mg) daily as needed only for weight gain of 3 lb over night or 5 lb in 1 week. What changed: See the new instructions.   HYDROmorphone   2 MG tablet Commonly known as: DILAUDID  Take 1 tablet (2 mg total) by mouth every 3 (three) hours as needed for severe pain (pain score 7-10).   ICAPS PO Take 1 capsule by mouth daily.   Iron 325 (65 Fe) MG Tabs Take 325 mg by mouth daily.   LORazepam  0.5 MG tablet Commonly known as: ATIVAN  Take 1 tablet (0.5 mg total) by mouth every 4 (four) hours as needed for anxiety. May crush, mix with water and give sublingually if needed.   metoprolol  succinate 50 MG 24 hr tablet Commonly known as: TOPROL -XL TAKE ONE TABLET BY MOUTH DAILY   olmesartan 40 MG tablet Commonly known as: BENICAR Take 40 mg by mouth daily.   ondansetron  4 MG disintegrating tablet Commonly known as: ZOFRAN -ODT Take 1 tablet (4 mg total) by mouth every 8 (eight) hours as needed for nausea or vomiting.   oxyCODONE  5 MG immediate release tablet Commonly known as: Roxicodone  Take 1 tablet (5 mg total) by mouth every 4 (four) hours as needed for severe pain (pain score 7-10).   polyethylene glycol powder 17 GM/SCOOP powder Commonly known as: GLYCOLAX /MIRALAX  Take 17 g by mouth daily as needed for mild constipation. Dissolve 1 capful (17g) in 4-8 ounces of liquid and take by mouth daily.   Stool Softener/Laxative 50-8.6 MG tablet Generic drug: senna-docusate Take 1 tablet by mouth 2 (two) times daily.   Tafluprost  (PF) 0.0015 % Soln Place 1 drop into the right eye at bedtime.        Follow-up Information     Claudene Pellet, MD Follow up.   Specialty: Family Medicine Contact information: [address] [place] [address] [phone]                Discharge Exam: Regina Taylor   06/08/24 1431  Weight: 70.5 kg   General.  Frail elderly lady, in no acute distress. Pulmonary.  Lungs clear bilaterally, normal respiratory effort. CV.  Regular rate and rhythm, no JVD, rub or murmur. Abdomen.  Soft, mild lower abdominal tenderness, nondistended, BS positive. CNS.  Alert and  oriented .  No focal neurologic deficit. Extremities.  No edema, no cyanosis, pulses intact and symmetrical.  Condition at discharge: stable  The results of significant diagnostics from this hospitalization (including imaging, microbiology, ancillary and laboratory) are listed below for reference.   Imaging Studies: DG Abd Portable 1V-Small Bowel Obstruction Protocol-24 hr delay Result Date: 06/09/2024 EXAM: 1 VIEW XRAY OF THE ABDOMEN 06/09/2024 10:43:00 PM COMPARISON: Plate abdomen study earlier today at 6:30 am. CLINICAL HISTORY: Encounter for imaging study to confirm nasogastric (NG) tube placement. FINDINGS: BOWEL: The enteric contrast has progressed within the colon to the mid descending segment. There is contrast opacification of dilated abdominal and normal caliber pelvic small bowel with maximum small bowel caliber again 4 cm left lower abdomen. SOFT TISSUES: No abnormal calcifications. NGT is in place terminating in the body of the stomach. BONES: No acute fracture. IMPRESSION: 1. NG tube in place terminating in the  body of the stomach. 2. Dilated abdominal small bowel with maximum caliber 4 cm in the left lower abdomen, unchanged . 3. Contrast progressed within the colon to the mid descending segment . Electronically signed by: Francis Quam MD 06/09/2024 10:54 PM EST RP Workstation: [id number]   DG Abd Portable 1V-Small Bowel Obstruction Protocol-initial, 8 hr delay Result Date: 06/09/2024 CLINICAL DATA:  Small bowel obstruction.  Protocol. EXAM: PORTABLE ABDOMEN - 1 VIEW COMPARISON:  06/08/2024 at 1422 hours. FINDINGS: NG tube tip is in the distal stomach in the pyloric region. Distended small bowel again noted measuring up to 3.7 cm diameter. Dilated opacified small bowel is seen in the left abdomen and pelvis. Right colon is distended and opacified. The right colon is noted to be fluid-filled and distended on CT imaging yesterday. Appearance today may reflect some opacification of the right  colonic fluid although attenuation seen on the current x-ray may simply reflect colonic fluid. IMPRESSION: 1. Persistent small bowel dilatation with opacified distended small bowel in the left abdomen and pelvis. 2. Right colon is distended and relatively dense. The right colon was noted to be fluid-filled and distended on CT imaging yesterday. Appearance on today's x-ray suggests migration of contrast into the right colonic fluid. Electronically Signed   By: Camellia Candle M.D.   On: 06/09/2024 06:52   DG Abd Portable 1V-Small Bowel Protocol-Position Verification Result Date: 06/08/2024 EXAM: 1 VIEW XRAY OF THE ABDOMEN 06/08/2024 02:26:00 PM COMPARISON: 06/08/2024 CLINICAL HISTORY: Encounter for imaging study to confirm nasogastric (NG) tube placement FINDINGS: LINES, TUBES AND DEVICES: Enteric tube in place with distal tip and side port terminating within the expected location of the gastric body. BOWEL: Nonobstructive bowel gas pattern. SOFT TISSUES: No abnormal calcifications. BONES: No acute fracture. IMPRESSION: 1. Enteric tube tip and side port terminate within the expected location of the gastric body. Electronically signed by: Greig Pique MD 06/08/2024 03:18 PM EST RP Workstation: [id number]   DG Abd Portable 1 View Result Date: 06/08/2024 CLINICAL DATA:  Nasogastric tube placement. EXAM: PORTABLE ABDOMEN - 1 VIEW COMPARISON:  None. FINDINGS: Nasogastric tube terminates in the gastric antrum. Unremarkable bowel gas pattern. IMPRESSION: Nasogastric tube terminates in the gastric antrum. Electronically Signed   By: Newell Eke M.D.   On: 06/08/2024 11:16   CT ABDOMEN PELVIS WO CONTRAST Result Date: 06/08/2024 CLINICAL DATA:  Nausea, vomiting and poor appetite. Bowel obstruction suspected. EXAM: CT ABDOMEN AND PELVIS WITHOUT CONTRAST TECHNIQUE: Multidetector CT imaging of the abdomen and pelvis was performed following the standard protocol without IV contrast. RADIATION DOSE REDUCTION: This exam  was performed according to the departmental dose-optimization program which includes automated exposure control, adjustment of the mA and/or kV according to patient size and/or use of iterative reconstruction technique. COMPARISON:  Abdominopelvic CT 12/04/2021. FINDINGS: Lower chest: Scattered small nodules at both lung bases are stable from previous CT, consistent with benign findings. No significant pleural effusion. Stable small hiatal hernia. Hepatobiliary: The liver has a non cirrhotic morphology without suspicious focal abnormality on noncontrast imaging. There are calcified gallstones without evidence of gallbladder wall thickening, surrounding inflammation or biliary ductal dilatation. Pancreas: Unremarkable. No pancreatic ductal dilatation or surrounding inflammatory changes. Spleen: Normal in size without focal abnormality. Adrenals/Urinary Tract: Both adrenal glands appear normal. No evidence of urinary tract calculus, hydronephrosis or perinephric soft tissue stranding. Simple appearing cyst in the lower pole of the left kidney measuring 3.2 cm, not well seen previously secondary to motion artifact; no specific follow-up imaging recommended. The bladder appears  unremarkable for its degree of distention although is partially obscured by artifact from the right hip arthroplasty. Stomach/Bowel: Possible minimal enteric contrast within the stomach and proximal small bowel. The stomach is decompressed. There are multiple mildly dilated and fluid-filled loops of mid to distal small bowel. In addition, the proximal colon is distended and fluid-filled. There is concern of an obstructing apple-core lesion in the proximal transverse colon measuring up to 4.5 cm in length on coronal image 62/5. This appearance is highly concerning for colon cancer. The distal colon appears decompressed. There are scattered mild diverticular changes within the descending and sigmoid colon. Vascular/Lymphatic: Mild soft tissue  stranding around the suspected transverse colon mass. No enlarged abdominopelvic lymph nodes are identified. Aortic and branch vessel atherosclerosis without evidence of aneurysm. Reproductive: The uterus and ovaries appear unremarkable. No adnexal mass. Other: No ascites or peritoneal nodularity. No evidence of abdominal wall hernia. Musculoskeletal: No acute osseous findings. Status post right total hip arthroplasty with stable 2.5 cm fluid collection along the right iliopsoas tendon (image 64/2), likely bursitis. Progressive severe left hip arthropathy with partial collapse of the left femoral head. Relatively mild spondylosis with a convex right scoliosis. IMPRESSION: 1. Findings are highly concerning for colon cancer in the proximal transverse colon with associated colonic obstruction. Recommend further evaluation with colonoscopy. 2. No evidence of metastatic disease. 3. Cholelithiasis without evidence of cholecystitis or biliary ductal dilatation. 4. Progressive severe left hip arthropathy with partial collapse of the left femoral head. 5.  Aortic Atherosclerosis (ICD10-I70.0). 6. These results were called by telephone at the time of interpretation on 06/08/2024 at 9:54 am to provider [facility] , who verbally acknowledged these results. Electronically Signed   By: Elsie Perone M.D.   On: 06/08/2024 09:54    Microbiology: Results for orders placed or performed during the hospital encounter of 08/22/20  SARS CORONAVIRUS 2 (TAT 6-24 HRS) Nasopharyngeal Nasopharyngeal Swab     Status: None   Collection Time: 08/22/20 11:06 AM   Specimen: Nasopharyngeal Swab  Result Value Ref Range Status   SARS Coronavirus 2 NEGATIVE NEGATIVE Final    Comment: (NOTE) SARS-CoV-2 target nucleic acids are NOT DETECTED.  The SARS-CoV-2 RNA is generally detectable in upper and lower respiratory specimens during the acute phase of infection. Negative results do not preclude SARS-CoV-2 infection, do not rule  out co-infections with other pathogens, and should not be used as the sole basis for treatment or other patient management decisions. Negative results must be combined with clinical observations, patient history, and epidemiological information. The expected result is Negative.  Fact Sheet for Patients: hairslick.no  Fact Sheet for Healthcare Providers: quierodirigir.com  This test is not yet approved or cleared by the [place]  FDA and  has been authorized for detection and/or diagnosis of SARS-CoV-2 by FDA under an Emergency Use Authorization (EUA). This EUA will remain  in effect (meaning this test can be used) for the duration of the COVID-19 declaration under Se ction 564(b)(1) of the Act, 21 U.S.C. section 360bbb-3(b)(1), unless the authorization is terminated or revoked sooner.  Performed at [facility], [address] [place], [place] [address]     Labs: CBC: Recent Labs  Lab 06/08/24 0940 06/09/24 0541  WBC 7.3 6.3  HGB 11.2* 10.6*  HCT 35.4* 33.0*  MCV 91.5 91.2  PLT 301 281   Basic Metabolic Panel: Recent Labs  Lab 06/08/24 0940 06/09/24 0541  NA 136 140  K 4.3 3.9  CL 100 108  CO2  24 22  GLUCOSE 147* 128*  BUN 23 24*  CREATININE 0.75 0.68  CALCIUM  9.3 9.3   Liver Function Tests: Recent Labs  Lab 06/08/24 0940  AST 18  ALT 12  ALKPHOS 86  BILITOT 0.4  PROT 7.5  ALBUMIN 4.2   CBG: No results for input(s): GLUCAP in the last 168 hours.  Discharge time spent: greater than 30 minutes.  This record has been created using Conservation officer, historic buildings. Errors have been sought and corrected,but may not always be located. Such creation errors do not reflect on the standard of care.   Signed: Amaryllis Dare, MD [facility] 06/12/2024 "

## 2024-06-12 NOTE — Progress Notes (Signed)
 "                                                                                                                                                                                                         Palliative Medicine Progress Note   Patient Name: Regina Taylor       Date: 06/12/2024 DOB: [birth year]  Age: 89 y.o. MRN#: [id number] Attending Physician: Caleen Qualia, MD Primary Care Physician: Claudene Pellet, MD Admit Date: 06/08/2024  Reason for Consultation/Follow-up: {Reason for Consult:23484}  HPI/Patient Profile: Kween Bacorn is a 89 year old female who has a past medical history of arthritis, visual impairment due to glaucoma and macular degeneration, GERD, mitral valve prolapse, hypertension, atrial fibrillation, hypercholesterolemia, iron deficiency, and diastolic heart failure.  Asjah was admitted on 19 January in the setting of abdominal discomfort and nausea.  Thus far her workup has revealed partial obstruction of her colon worrisome for potential colon cancer.  The palliative care team has been asked to support additional goals of care conversations.   Subjective: Chart reviewed. Per MAR, in the past 24 hours patient has received PRN oral dilaudid  x 3 doses.    Objective:  Physical Exam          Vital Signs: BP (!) 125/55 (BP Location: Left Arm)   Pulse (!) 52   Temp (!) 97.5 F (36.4 C) (Oral)   Resp 18   Ht 5' 5 (1.651 m)   Wt 70.5 kg   SpO2 92%   BMI 25.86 kg/m  SpO2: SpO2: 92 % O2 Device: O2 Device: Room Air O2 Flow Rate:    Intake/output summary:  Intake/Output Summary (Last 24 hours) at 06/12/2024 1257 Last data filed at 06/11/2024 1500 Gross per 24 hour  Intake 240 ml  Output --  Net 240 ml    LBM: Last BM Date : 06/07/24     Palliative Assessment/Data: ***     Palliative Medicine Assessment & Plan   Assessment: Principal Problem:   Colonic obstruction (HCC) Active Problems:   Atrial fibrillation with RVR (HCC)   Colonic mass     Recommendations/Plan: DNR/DNI - gold form signed and placed on chart  Goals of Care and Additional Recommendations: Limitations on Scope of Treatment: {Recommended Scope and Preferences:21019}  Code Status:   Prognosis:  {Palliative Care Prognosis:23504}  Discharge Planning: {Palliative dispostion:23505}  Care plan was discussed with ***  Thank you for allowing the Palliative Medicine Team to assist in the care of this patient.   ***   Recardo KATHEE Loll, NP   Please  contact Palliative Medicine Team phone at [phone] for questions and concerns.  For individual providers, please see AMION.      "

## 2024-06-12 NOTE — Plan of Care (Signed)
  Problem: Clinical Measurements: Goal: Ability to maintain clinical measurements within normal limits will improve Outcome: Progressing Goal: Will remain free from infection Outcome: Progressing   Problem: Activity: Goal: Risk for activity intolerance will decrease Outcome: Progressing   Problem: Nutrition: Goal: Adequate nutrition will be maintained Outcome: Progressing   Problem: Coping: Goal: Level of anxiety will decrease Outcome: Progressing   

## 2024-06-12 NOTE — Progress Notes (Signed)
 Discharge medications delivered to patient at the bedside in a secure bag.

## 2024-06-12 NOTE — Evaluation (Signed)
 Physical Therapy One Time Evaluation Patient Details Name: Regina Taylor MRN: [id number] DOB: May 17, 1928 Today's Date: 06/12/2024  History of Present Illness  89 year old female admitted for obstructing colon mass. Palliative team on board discussed about symptom management, home with hospice. 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 All education has been completed and the patient has no further questions.  Pt provided with CGA for safety and able to ambulate 40 ft with her SW from home.  Son present and reports pt does not have to perform stairs and has approx 30 ft to ambulate to bathroom at home.  Pt appears close to baseline and current plan is d/c home with hospice.  If pt remain in acute setting, will request mobility specialist to check on pt to assist with safely mobilizing as pt reports she has not been out into hallway since admission. No further follow-up Physical Therapy or equipment needs identified at this time. PT is signing off. Thank you for this referral.         If plan is discharge home, recommend the following: Assistance with cooking/housework;Assist for transportation;Help with stairs or ramp for entrance   Can travel by private vehicle        Equipment Recommendations None recommended by PT  Recommendations for Other Services       Functional Status Assessment Patient has not had a recent decline in their functional status     Precautions / Restrictions Precautions Precautions: Fall      Mobility  Bed Mobility               General bed mobility comments: pt in recliner    Transfers Overall transfer level: Needs assistance Equipment used:  Standard walker Transfers: Sit to/from Stand Sit to Stand: Contact guard assist           General transfer comment: CGA provided for safety    Ambulation/Gait Ambulation/Gait assistance: Contact guard assist Gait Distance (Feet): 40 Feet Assistive device: Standard walker Gait Pattern/deviations: Step-through pattern, Decreased stride length, Trunk flexed Gait velocity: decr     General Gait Details: pt utilized SW from home, CGA provided for safety, pt denies any symptoms  Stairs            Wheelchair Mobility     Tilt Bed    Modified Rankin (Stroke Patients Only)       Balance Overall balance assessment: Needs assistance         Standing balance support: Bilateral upper extremity supported, Reliant on assistive device for balance, During functional activity Standing balance-Leahy Scale: Poor Standing balance comment: reliant on UE support with mobility                             Pertinent Vitals/Pain Pain Assessment Pain Assessment: No/denies pain    Home Living Family/patient expects to be discharged to:: Private residence Living Arrangements: Alone Available Help at Discharge: Family Type of Home: House         Home Layout: Able to live on main level with bedroom/bathroom Home Equipment: Standard Walker      Prior Function Prior Level of Function : Independent/Modified Independent  Mobility Comments: ambulatory with SW (pt's SW from home present in hospital room)       Extremity/Trunk Assessment        Lower Extremity Assessment Lower Extremity Assessment: Generalized weakness    Cervical / Trunk Assessment Cervical / Trunk Assessment: Kyphotic  Communication   Communication Communication: Impaired Factors Affecting Communication: Other (comment) (hx of vision impairment)    Cognition Arousal: Alert Behavior During Therapy: Flat affect   PT - Cognitive impairments: No apparent impairments                          Following commands: Intact       Cueing       General Comments      Exercises     Assessment/Plan    PT Assessment Patient does not need any further PT services  PT Problem List         PT Treatment Interventions      PT Goals (Current goals can be found in the Care Plan section)  Acute Rehab PT Goals PT Goal Formulation: All assessment and education complete, DC therapy    Frequency       Co-evaluation               AM-PAC PT 6 Clicks Mobility  Outcome Measure Help needed turning from your back to your side while in a flat bed without using bedrails?: A Little Help needed moving from lying on your back to sitting on the side of a flat bed without using bedrails?: A Little Help needed moving to and from a bed to a chair (including a wheelchair)?: A Little Help needed standing up from a chair using your arms (e.g., wheelchair or bedside chair)?: A Little Help needed to walk in hospital room?: A Little Help needed climbing 3-5 steps with a railing? : A Little 6 Click Score: 18    End of Session Equipment Utilized During Treatment: Gait belt Activity Tolerance: Patient tolerated treatment well Patient left: in chair;with call bell/phone within reach;with chair alarm set;with family/visitor present Nurse Communication: Mobility status PT Visit Diagnosis: Difficulty in walking, not elsewhere classified (R26.2)    Time: 8969-8961 PT Time Calculation (min) (ACUTE ONLY): 8 min   Charges:   PT Evaluation $PT Eval Low Complexity: 1 Low   PT General Charges $$ ACUTE PT VISIT: 1 Visit        Tari PT, DPT Physical Therapist Acute Rehabilitation Services Office: [phone]   Tari CROME Payson 06/12/2024, 11:45 AM

## 2024-07-14 ENCOUNTER — Ambulatory Visit: Admitting: Podiatry

## 2024-09-22 ENCOUNTER — Ambulatory Visit: Admitting: Podiatry
